# Patient Record
Sex: Male | Born: 1945 | Race: White | Hispanic: No | Marital: Married | Smoking: Former smoker
Health system: Southern US, Community
[De-identification: ages and names within clinical notes are randomized; demographics above are authoritative.]

## PROBLEM LIST (undated history)

## (undated) DIAGNOSIS — R200 Anesthesia of skin: Secondary | ICD-10-CM

## (undated) DIAGNOSIS — I10 Essential (primary) hypertension: Secondary | ICD-10-CM

## (undated) DIAGNOSIS — I639 Cerebral infarction, unspecified: Secondary | ICD-10-CM

## (undated) DIAGNOSIS — R202 Paresthesia of skin: Secondary | ICD-10-CM

## (undated) DIAGNOSIS — G629 Polyneuropathy, unspecified: Secondary | ICD-10-CM

## (undated) DIAGNOSIS — M1990 Unspecified osteoarthritis, unspecified site: Secondary | ICD-10-CM

## (undated) DIAGNOSIS — C801 Malignant (primary) neoplasm, unspecified: Secondary | ICD-10-CM

## (undated) DIAGNOSIS — I480 Paroxysmal atrial fibrillation: Secondary | ICD-10-CM

## (undated) DIAGNOSIS — D649 Anemia, unspecified: Secondary | ICD-10-CM

## (undated) DIAGNOSIS — E119 Type 2 diabetes mellitus without complications: Secondary | ICD-10-CM

## (undated) DIAGNOSIS — E785 Hyperlipidemia, unspecified: Secondary | ICD-10-CM

## (undated) HISTORY — PX: KNEE SURGERY: SHX244

## (undated) HISTORY — DX: Malignant (primary) neoplasm, unspecified: C80.1

## (undated) HISTORY — DX: Cerebral infarction, unspecified: I63.9

## (undated) HISTORY — DX: Anemia, unspecified: D64.9

## (undated) HISTORY — DX: Essential (primary) hypertension: I10

## (undated) HISTORY — DX: Type 2 diabetes mellitus without complications: E11.9

## (undated) HISTORY — DX: Hyperlipidemia, unspecified: E78.5

---

## 1978-10-08 HISTORY — PX: KNEE SURGERY: SHX244

## 2002-09-07 DIAGNOSIS — C4491 Basal cell carcinoma of skin, unspecified: Secondary | ICD-10-CM

## 2002-09-07 HISTORY — DX: Basal cell carcinoma of skin, unspecified: C44.91

## 2004-09-07 DIAGNOSIS — I10 Essential (primary) hypertension: Secondary | ICD-10-CM | POA: Insufficient documentation

## 2004-09-07 DIAGNOSIS — I480 Paroxysmal atrial fibrillation: Secondary | ICD-10-CM | POA: Insufficient documentation

## 2004-09-07 HISTORY — DX: Paroxysmal atrial fibrillation: I48.0

## 2004-09-15 ENCOUNTER — Encounter: Payer: Self-pay | Admitting: Cardiology

## 2008-09-07 DIAGNOSIS — E1142 Type 2 diabetes mellitus with diabetic polyneuropathy: Secondary | ICD-10-CM | POA: Insufficient documentation

## 2008-09-07 DIAGNOSIS — E119 Type 2 diabetes mellitus without complications: Secondary | ICD-10-CM | POA: Insufficient documentation

## 2011-03-08 DIAGNOSIS — Z8673 Personal history of transient ischemic attack (TIA), and cerebral infarction without residual deficits: Secondary | ICD-10-CM | POA: Insufficient documentation

## 2011-03-08 HISTORY — DX: Personal history of transient ischemic attack (TIA), and cerebral infarction without residual deficits: Z86.73

## 2012-03-31 ENCOUNTER — Inpatient Hospital Stay (HOSPITAL_COMMUNITY)
Admission: EM | Admit: 2012-03-31 | Discharge: 2012-04-01 | DRG: 066 | Disposition: A | Discharge status: Home / Self Care | Payer: Medicare Other | Attending: Internal Medicine | Admitting: Internal Medicine

## 2012-03-31 ENCOUNTER — Encounter (HOSPITAL_COMMUNITY): Payer: Self-pay | Admitting: Emergency Medicine

## 2012-03-31 ENCOUNTER — Emergency Department (HOSPITAL_COMMUNITY): Payer: Medicare Other

## 2012-03-31 DIAGNOSIS — Z794 Long term (current) use of insulin: Secondary | ICD-10-CM

## 2012-03-31 DIAGNOSIS — E119 Type 2 diabetes mellitus without complications: Secondary | ICD-10-CM | POA: Diagnosis present

## 2012-03-31 DIAGNOSIS — E1149 Type 2 diabetes mellitus with other diabetic neurological complication: Secondary | ICD-10-CM | POA: Diagnosis present

## 2012-03-31 DIAGNOSIS — Z7982 Long term (current) use of aspirin: Secondary | ICD-10-CM

## 2012-03-31 DIAGNOSIS — I498 Other specified cardiac arrhythmias: Secondary | ICD-10-CM

## 2012-03-31 DIAGNOSIS — I6350 Cerebral infarction due to unspecified occlusion or stenosis of unspecified cerebral artery: Principal | ICD-10-CM | POA: Diagnosis present

## 2012-03-31 DIAGNOSIS — I480 Paroxysmal atrial fibrillation: Secondary | ICD-10-CM | POA: Diagnosis present

## 2012-03-31 DIAGNOSIS — Z79899 Other long term (current) drug therapy: Secondary | ICD-10-CM

## 2012-03-31 DIAGNOSIS — I4891 Unspecified atrial fibrillation: Secondary | ICD-10-CM | POA: Diagnosis present

## 2012-03-31 DIAGNOSIS — G629 Polyneuropathy, unspecified: Secondary | ICD-10-CM | POA: Diagnosis present

## 2012-03-31 DIAGNOSIS — I639 Cerebral infarction, unspecified: Secondary | ICD-10-CM | POA: Diagnosis present

## 2012-03-31 DIAGNOSIS — E1142 Type 2 diabetes mellitus with diabetic polyneuropathy: Secondary | ICD-10-CM | POA: Diagnosis present

## 2012-03-31 HISTORY — DX: Polyneuropathy, unspecified: G62.9

## 2012-03-31 HISTORY — DX: Paroxysmal atrial fibrillation: I48.0

## 2012-03-31 LAB — POCT I-STAT, CHEM 8
Glucose, Bld: 112 mg/dL — ABNORMAL HIGH (ref 70–99)
HCT: 41 % (ref 39.0–52.0)
Hemoglobin: 13.9 g/dL (ref 13.0–17.0)
Potassium: 3.9 mEq/L (ref 3.5–5.1)

## 2012-03-31 LAB — DIFFERENTIAL
Basophils Absolute: 0 10*3/uL (ref 0.0–0.1)
Eosinophils Absolute: 0.1 10*3/uL (ref 0.0–0.7)
Eosinophils Relative: 1 % (ref 0–5)
Monocytes Absolute: 0.7 10*3/uL (ref 0.1–1.0)

## 2012-03-31 LAB — CK TOTAL AND CKMB (NOT AT ARMC)
CK, MB: 12.2 ng/mL (ref 0.3–4.0)
Relative Index: 3.6 — ABNORMAL HIGH (ref 0.0–2.5)
Total CK: 300 U/L — ABNORMAL HIGH (ref 7–232)

## 2012-03-31 LAB — COMPREHENSIVE METABOLIC PANEL
ALT: 22 U/L (ref 0–53)
AST: 25 U/L (ref 0–37)
CO2: 25 mEq/L (ref 19–32)
Calcium: 9.9 mg/dL (ref 8.4–10.5)
Creatinine, Ser: 1.18 mg/dL (ref 0.50–1.35)
GFR calc Af Amer: 71 mL/min — ABNORMAL LOW (ref >90)
GFR calc non Af Amer: 62 mL/min — ABNORMAL LOW (ref >90)
Glucose, Bld: 111 mg/dL — ABNORMAL HIGH (ref 70–99)
Sodium: 141 mEq/L (ref 135–145)
Total Protein: 6.9 g/dL (ref 6.0–8.3)

## 2012-03-31 LAB — PROTIME-INR: Prothrombin Time: 14.1 seconds (ref 11.6–15.2)

## 2012-03-31 LAB — CBC
HCT: 40.1 % (ref 39.0–52.0)
MCH: 30.7 pg (ref 26.0–34.0)
MCV: 88.5 fL (ref 78.0–100.0)
Platelets: 149 10*3/uL — ABNORMAL LOW (ref 150–400)
RDW: 13.2 % (ref 11.5–15.5)

## 2012-03-31 LAB — GLUCOSE, CAPILLARY: Glucose-Capillary: 110 mg/dL — ABNORMAL HIGH (ref 70–99)

## 2012-03-31 LAB — RAPID URINE DRUG SCREEN, HOSP PERFORMED
Barbiturates: NOT DETECTED
Benzodiazepines: NOT DETECTED

## 2012-03-31 LAB — POCT I-STAT TROPONIN I: Troponin i, poc: 0 ng/mL (ref 0.00–0.08)

## 2012-03-31 LAB — TROPONIN I: Troponin I: <0.3 ng/mL (ref <0.30)

## 2012-03-31 MED ORDER — GADOBENATE DIMEGLUMINE 529 MG/ML IV SOLN
15.0000 mL | Freq: Once | INTRAVENOUS | Status: AC
  Administered 2012-03-31: 15 mL via INTRAVENOUS

## 2012-03-31 MED ORDER — SODIUM CHLORIDE 0.9 % IJ SOLN
3.0000 mL | Freq: Two times a day (BID) | INTRAMUSCULAR | Status: DC
  Administered 2012-03-31 – 2012-04-01 (×2): 3 mL via INTRAVENOUS

## 2012-03-31 MED ORDER — WARFARIN VIDEO
Freq: Once | Status: AC
  Administered 2012-03-31: 21:00:00

## 2012-03-31 MED ORDER — ONDANSETRON HCL 4 MG/2ML IJ SOLN: 4.0000 mg | Freq: Four times a day (QID) | INTRAMUSCULAR | Status: DC | PRN

## 2012-03-31 MED ORDER — IBUPROFEN 800 MG PO TABS: 800.0000 mg | ORAL_TABLET | Freq: Four times a day (QID) | ORAL | Status: DC | PRN

## 2012-03-31 MED ORDER — WARFARIN - PHARMACIST DOSING INPATIENT: Freq: Every day | Status: DC

## 2012-03-31 MED ORDER — INSULIN ASPART 100 UNIT/ML ~~LOC~~ SOLN: 0.0000 [IU] | Freq: Three times a day (TID) | SUBCUTANEOUS | Status: DC

## 2012-03-31 MED ORDER — HYDROCODONE-ACETAMINOPHEN 5-325 MG PO TABS: 1.0000 | ORAL_TABLET | ORAL | Status: DC | PRN

## 2012-03-31 MED ORDER — ASPIRIN EC 81 MG PO TBEC: 81.0000 mg | DELAYED_RELEASE_TABLET | Freq: Every day | ORAL | Status: DC

## 2012-03-31 MED ORDER — METOPROLOL TARTRATE 25 MG PO TABS: 50.0000 mg | ORAL_TABLET | Freq: Two times a day (BID) | ORAL | Status: DC

## 2012-03-31 MED ORDER — ALUM & MAG HYDROXIDE-SIMETH 200-200-20 MG/5ML PO SUSP: 30.0000 mL | Freq: Four times a day (QID) | ORAL | Status: DC | PRN

## 2012-03-31 MED ORDER — ASPIRIN 81 MG PO CHEW: 81.0000 mg | CHEWABLE_TABLET | Freq: Every day | ORAL | Status: DC

## 2012-03-31 MED ORDER — ENOXAPARIN SODIUM 150 MG/ML ~~LOC~~ SOLN: 1.0000 mg/kg | Freq: Two times a day (BID) | SUBCUTANEOUS | Status: DC

## 2012-03-31 MED ORDER — COUMADIN BOOK
Freq: Once | Status: AC
  Administered 2012-03-31: 21:00:00
  Filled 2012-03-31: qty 1

## 2012-03-31 MED ORDER — HEPARIN SODIUM (PORCINE) 5000 UNIT/ML IJ SOLN
5000.0000 [IU] | Freq: Three times a day (TID) | INTRAMUSCULAR | Status: DC
  Administered 2012-03-31 – 2012-04-01 (×2): 5000 [IU] via SUBCUTANEOUS
  Filled 2012-03-31 (×5): qty 1

## 2012-03-31 MED ORDER — WARFARIN SODIUM 7.5 MG PO TABS
7.5000 mg | ORAL_TABLET | Freq: Once | ORAL | Status: AC
  Administered 2012-03-31: 7.5 mg via ORAL
  Filled 2012-03-31: qty 1

## 2012-03-31 MED ORDER — METFORMIN HCL 500 MG PO TABS
1000.0000 mg | ORAL_TABLET | Freq: Two times a day (BID) | ORAL | Status: DC
  Administered 2012-03-31: 1000 mg via ORAL
  Filled 2012-03-31: qty 2

## 2012-03-31 MED ORDER — ONDANSETRON HCL 4 MG PO TABS: 4.0000 mg | ORAL_TABLET | Freq: Four times a day (QID) | ORAL | Status: DC | PRN

## 2012-03-31 MED ORDER — ASPIRIN 81 MG PO CHEW
324.0000 mg | CHEWABLE_TABLET | Freq: Once | ORAL | Status: AC
  Administered 2012-03-31: 324 mg via ORAL
  Filled 2012-03-31: qty 4

## 2012-03-31 MED ORDER — METOPROLOL TARTRATE 25 MG/10 ML ORAL SUSPENSION
12.5000 mg | Freq: Two times a day (BID) | ORAL | Status: DC
  Administered 2012-04-01: 12.5 mg via ORAL
  Filled 2012-03-31 (×3): qty 5

## 2012-03-31 MED ORDER — POLYETHYLENE GLYCOL 3350 17 G PO PACK
17.0000 g | PACK | Freq: Every day | ORAL | Status: DC | PRN
  Filled 2012-03-31: qty 1

## 2012-03-31 NOTE — H&P (Signed)
[facility]                                                                                    Patient Demographics  Elman Dettman, is a 66 y.o. male  CSN: [id number]  MRN: [id number]  DOB - September 18, 1945  Admit Date - 03/31/2012  Outpatient Primary MD for the patient is Kaleen Mask, MD   With History of -  Past Medical History  Diagnosis Date  . Diabetes mellitus   . Paroxysmal atrial fibrillation   . Neuropathy       History reviewed. No pertinent past surgical history.  in for   Chief Complaint  Patient presents with  . Extremity Weakness     HPI  Waylin Dorko  is a 66 y.o. male, with history of diabetes mellitus paroxysmal A. Fib, since with complaints for left upper extremity weakness and numbness, patient reports he woke up this a.m. With left upper extremity weakness, he didn't pay much attention to it, where he felt it was hard for him to carry stuff, where at work he felt it he was unable to carry charts, which prompted him to come to ED, currently patient reports much of his symptoms improved and almost resolved, initially patient had CT brain which did not show any acute abnormality, the patient had MRI of the brain which did show tiny acute infarct in the posterior frontal lobe, patient reports he has history of paroxysmal axial fibrillation, diagnosed 4-5 years ago, where he was on metoprolol for rate controlled, and he he was on aspirin. Patient denies any loss of consciousness ,confusion, altered mental status, slurred speech,dizziness , lightheadedness, chest pain, palpitation, patient was given 325 of aspirin in ED, EKG was normal sinus rhythm, bradycardic at 43, patient reports usually his heart rate in the low 50s or in the mid 40s which is his baseline.    Review of Systems    In addition to the HPI above, No Fever-chills, No Headache, No changes with Vision or hearing, No problems swallowing food or Liquids, No Chest pain,  Cough or Shortness of Breath, No Abdominal pain, No Nausea or Vommitting, Bowel movements are regular, No Blood in stool or Urine, No dysuria, No new skin rashes or bruises, No new joints pains-aches,  Complaints of left upper extremity weakness and numbness and poor coordination No recent weight gain or loss, No polyuria, polydypsia or polyphagia, No significant Mental Stressors.  A full 10 point Review of Systems was done, except as stated above, all other Review of Systems were negative.   Social History History  Substance Use Topics  . Smoking status: Never Smoker   . Smokeless tobacco: Not on file  . Alcohol Use: No    Family History History reviewed. No pertinent family history.   Prior to Admission medications   Medication Sig Start Date End Date Taking? Authorizing Provider  aspirin EC 81 MG tablet Take 81 mg by mouth daily.   Yes Historical Provider, MD  ibuprofen (ADVIL,MOTRIN) 200 MG tablet Take 800 mg by mouth every 6 (six) hours as needed. For pain   Yes Historical Provider, MD  metFORMIN (GLUCOPHAGE) 1000 MG tablet  Take 1,000 mg by mouth 2 (two) times daily with a meal.   Yes Historical Provider, MD  metoprolol (LOPRESSOR) 50 MG tablet Take 50 mg by mouth 2 (two) times daily.   Yes Historical Provider, MD    No Known Allergies  Physical Exam  Vitals  Blood pressure 144/88, pulse 42, temperature 98.3 F (36.8 C), temperature source Oral, resp. rate 18, SpO2 100.00%.   1. General well-nourished male lying in bed in NAD,  2. Normal affect and insight, Not Suicidal or Homicidal, Awake Alert, Oriented X 3.  3. Granular have caused intact, left upper extremity motor strength 4-5 out of 5, symmetrical deep tendon reflexes, bilateral lower extremities and right upper extremity 5 out of 5..  4. Ears and Eyes appear Normal, Conjunctivae clear, PERRLA. Moist Oral Mucosa.  5. Supple Neck, No JVD, No cervical lymphadenopathy appriciated, No Carotid Bruits.  6.  Symmetrical Chest wall movement, Good air movement bilaterally, CTAB.  7. RRR, No Gallops, Rubs or Murmurs, No Parasternal Heave.  8. Positive Bowel Sounds, Abdomen Soft, Non tender, No organomegaly appriciated,No rebound -guarding or rigidity.  9.  No Cyanosis, Normal Skin Turgor, No Skin Rash or Bruise.  10. Good muscle tone,  joints appear normal , no effusions, Normal ROM.  11. No Palpable Lymph Nodes in Neck or Axillae    Data Review  CBC  Lab 03/31/12 1214 03/31/12 1135  WBC -- 8.2  HGB 13.9 13.9  HCT 41.0 40.1  PLT -- 149*  MCV -- 88.5  MCH -- 30.7  MCHC -- 34.7  RDW -- 13.2  LYMPHSABS -- 1.9  MONOABS -- 0.7  EOSABS -- 0.1  BASOSABS -- 0.0  BANDABS -- --   ------------------------------------------------------------------------------------------------------------------  Chemistries   Lab 03/31/12 1214 03/31/12 1135  NA 142 141  K 3.9 4.0  CL 107 105  CO2 -- 25  GLUCOSE 112* 111*  BUN 26* 25*  CREATININE 1.20 1.18  CALCIUM -- 9.9  MG -- --  AST -- 25  ALT -- 22  ALKPHOS -- 61  BILITOT -- 0.4   ------------------------------------------------------------------------------------------------------------------ CrCl is unknown because there is no height on file for the current visit. ------------------------------------------------------------------------------------------------------------------ No results found for this basename: TSH,T4TOTAL,FREET3,T3FREE,THYROIDAB in the last 72 hours   Coagulation profile  Lab 03/31/12 1135  INR 1.07  PROTIME --   ------------------------------------------------------------------------------------------------------------------- No results found for this basename: DDIMER:2 in the last 72 hours -------------------------------------------------------------------------------------------------------------------  Cardiac Enzymes  Lab 03/31/12 1409 03/31/12 1136  CKMB 10.8* 12.2*  TROPONINI <0.30 <0.30  MYOGLOBIN  -- --   ------------------------------------------------------------------------------------------------------------------ No components found with this basename: POCBNP:3   ---------------------------------------------------------------------------------------------------------------  Urinalysis No results found for this basename: colorurine, appearanceur, labspec, phurine, glucoseu, hgbur, bilirubinur, ketonesur, proteinur, urobilinogen, nitrite, leukocytesur    ----------------------------------------------------------------------------------------------------------------    Imaging results:   Ct Head Wo Contrast  03/31/2012  *RADIOLOGY REPORT*  Clinical Data: Extremity weakness.  CT HEAD WITHOUT CONTRAST  Technique:  Contiguous axial images were obtained from the base of the skull through the vertex without contrast.  Comparison: None.  Findings: No mass lesion, mass effect, midline shift, hydrocephalus, hemorrhage.  No territorial ischemia or acute infarction.  Bilateral mucous retention cysts or polyps are present within the maxillary sinuses.  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  IMPRESSION: Negative CT head.  Original Report Authenticated By: Andreas Newport, M.D.   Mr [facility] Wo Contrast  03/31/2012  *RADIOLOGY REPORT*  Clinical Data:  Left sided numbness.  Discoordination. Extremity weakness.  Diabetes.  Atrial fibrillation.  MRI BRAIN WITHOUT CONTRAST MRA HEAD WITHOUT CONTRAST MRA NECK WITHOUT AND WITH CONTRAST  Technique: Multiplanar, multiecho pulse sequences of the brain and surrounding structures were obtained according to standard protocol without intravenous contrast.  Angiographic images of the Circle of Willis were obtained using MRA technique without intravenous contrast.  Angiographic images of the neck were obtained using MRA technique without and with intravenous contrast.  Contrast:15 ml MultiHance.  Comparison:03/31/2012 head CT.  No comparison brain  MR.  MRI HEAD  Findings: Tiny acute non hemorrhagic infarct posterior right frontal lobe.  Mild white matter type changes consistent with result of small vessel disease.  No intracranial hemorrhage.  No intracranial mass lesion detected on this unenhanced exam.  Mild to atrophy without hydrocephalus.  Partially empty sella incidentally noted.  Polypoid opacification maxillary sinuses.  Mild mucosal thickening ethmoid sinus air cells and sphenoid sinus air cells.  IMPRESSION: Tiny acute non hemorrhagic infarct posterior right frontal lobe.  Please see above.  MRA HEAD  Findings:Anterior circulation without medium or large size vessel significant stenosis or occlusion.  Fetal type origin right posterior cerebral artery.  Mild middle cerebral artery branch vessel irregularity bilaterally.  No significant stenosis of the distal vertebral arteries or basilar artery.  Nonvisualization right PICA.  Poor delineation majority of the left AICA.  Mild irregularity distal posterior cerebral artery bilaterally.  Minimal bulge of the basilar tip probably represents tortuous vessel without aneurysm or vascular malformation noted.  IMPRESSION: Mild branch vessel irregularity as noted above.  MRA NECK  Findings:Mild irregularity of the proximal internal carotid artery bilaterally without measurable significant stenosis.  Left vertebral artery arises directly from the aortic arch. Proximal aspect slightly ectatic with minimal irregularity but without high-grade stenosis.  Two vessels traverse to supply the mid right vertebral artery.  The proximal aspect of these branches is slightly ectatic with minimal to mild narrowing.  IMPRESSION: Mild irregularity of the proximal internal carotid artery bilaterally without measurable significant stenosis.  Left vertebral artery arises directly from the aortic arch. Proximal aspect slightly ectatic with minimal irregularity but without high-grade stenosis.  Two vessels traverse to supply the mid  right vertebral artery.  The proximal aspect of these branches is slightly ectatic with minimal to mild narrowing.  Critical Value/emergent results were called by telephone at the time of interpretation on 03/31/2012 at 4:55 p.m. to Dr. Manus Gunning, who verbally acknowledged these results.  Original Report Authenticated By: Fuller Canada, M.D.   Mr Angiogram Neck W Wo Contrast  03/31/2012  *RADIOLOGY REPORT*  Clinical Data:  Left sided numbness.  Discoordination. Extremity weakness.  Diabetes.  Atrial fibrillation.  MRI BRAIN WITHOUT CONTRAST MRA HEAD WITHOUT CONTRAST MRA NECK WITHOUT AND WITH CONTRAST  Technique: Multiplanar, multiecho pulse sequences of the brain and surrounding structures were obtained according to standard protocol without intravenous contrast.  Angiographic images of the Circle of Willis were obtained using MRA technique without intravenous contrast.  Angiographic images of the neck were obtained using MRA technique without and with intravenous contrast.  Contrast:15 ml MultiHance.  Comparison:03/31/2012 head CT.  No comparison brain MR.  MRI HEAD  Findings: Tiny acute non hemorrhagic infarct posterior right frontal lobe.  Mild white matter type changes consistent with result of small vessel disease.  No intracranial hemorrhage.  No intracranial mass lesion detected on this unenhanced exam.  Mild to atrophy without hydrocephalus.  Partially empty sella incidentally noted.  Polypoid opacification maxillary sinuses.  Mild mucosal thickening ethmoid sinus air  cells and sphenoid sinus air cells.  IMPRESSION: Tiny acute non hemorrhagic infarct posterior right frontal lobe.  Please see above.  MRA HEAD  Findings:Anterior circulation without medium or large size vessel significant stenosis or occlusion.  Fetal type origin right posterior cerebral artery.  Mild middle cerebral artery branch vessel irregularity bilaterally.  No significant stenosis of the distal vertebral arteries or basilar artery.   Nonvisualization right PICA.  Poor delineation majority of the left AICA.  Mild irregularity distal posterior cerebral artery bilaterally.  Minimal bulge of the basilar tip probably represents tortuous vessel without aneurysm or vascular malformation noted.  IMPRESSION: Mild branch vessel irregularity as noted above.  MRA NECK  Findings:Mild irregularity of the proximal internal carotid artery bilaterally without measurable significant stenosis.  Left vertebral artery arises directly from the aortic arch. Proximal aspect slightly ectatic with minimal irregularity but without high-grade stenosis.  Two vessels traverse to supply the mid right vertebral artery.  The proximal aspect of these branches is slightly ectatic with minimal to mild narrowing.  IMPRESSION: Mild irregularity of the proximal internal carotid artery bilaterally without measurable significant stenosis.  Left vertebral artery arises directly from the aortic arch. Proximal aspect slightly ectatic with minimal irregularity but without high-grade stenosis.  Two vessels traverse to supply the mid right vertebral artery.  The proximal aspect of these branches is slightly ectatic with minimal to mild narrowing.  Critical Value/emergent results were called by telephone at the time of interpretation on 03/31/2012 at 4:55 p.m. to Dr. Manus Gunning, who verbally acknowledged these results.  Original Report Authenticated By: Fuller Canada, M.D.   Mr Brain Wo Contrast  03/31/2012  *RADIOLOGY REPORT*  Clinical Data:  Left sided numbness.  Discoordination. Extremity weakness.  Diabetes.  Atrial fibrillation.  MRI BRAIN WITHOUT CONTRAST MRA HEAD WITHOUT CONTRAST MRA NECK WITHOUT AND WITH CONTRAST  Technique: Multiplanar, multiecho pulse sequences of the brain and surrounding structures were obtained according to standard protocol without intravenous contrast.  Angiographic images of the Circle of Willis were obtained using MRA technique without intravenous contrast.   Angiographic images of the neck were obtained using MRA technique without and with intravenous contrast.  Contrast:15 ml MultiHance.  Comparison:03/31/2012 head CT.  No comparison brain MR.  MRI HEAD  Findings: Tiny acute non hemorrhagic infarct posterior right frontal lobe.  Mild white matter type changes consistent with result of small vessel disease.  No intracranial hemorrhage.  No intracranial mass lesion detected on this unenhanced exam.  Mild to atrophy without hydrocephalus.  Partially empty sella incidentally noted.  Polypoid opacification maxillary sinuses.  Mild mucosal thickening ethmoid sinus air cells and sphenoid sinus air cells.  IMPRESSION: Tiny acute non hemorrhagic infarct posterior right frontal lobe.  Please see above.  MRA HEAD  Findings:Anterior circulation without medium or large size vessel significant stenosis or occlusion.  Fetal type origin right posterior cerebral artery.  Mild middle cerebral artery branch vessel irregularity bilaterally.  No significant stenosis of the distal vertebral arteries or basilar artery.  Nonvisualization right PICA.  Poor delineation majority of the left AICA.  Mild irregularity distal posterior cerebral artery bilaterally.  Minimal bulge of the basilar tip probably represents tortuous vessel without aneurysm or vascular malformation noted.  IMPRESSION: Mild branch vessel irregularity as noted above.  MRA NECK  Findings:Mild irregularity of the proximal internal carotid artery bilaterally without measurable significant stenosis.  Left vertebral artery arises directly from the aortic arch. Proximal aspect slightly ectatic with minimal irregularity but without high-grade stenosis.  Two  vessels traverse to supply the mid right vertebral artery.  The proximal aspect of these branches is slightly ectatic with minimal to mild narrowing.  IMPRESSION: Mild irregularity of the proximal internal carotid artery bilaterally without measurable significant stenosis.  Left  vertebral artery arises directly from the aortic arch. Proximal aspect slightly ectatic with minimal irregularity but without high-grade stenosis.  Two vessels traverse to supply the mid right vertebral artery.  The proximal aspect of these branches is slightly ectatic with minimal to mild narrowing.  Critical Value/emergent results were called by telephone at the time of interpretation on 03/31/2012 at 4:55 p.m. to Dr. Manus Gunning, who verbally acknowledged these results.  Original Report Authenticated By: Fuller Canada, M.D.    My personal review of EKG: Rhythm NSR, 43   Assessment & Plan  Active Problems:  Paroxysmal atrial fibrillation  Neuropathy  CVA (cerebral infarction)  DM (diabetes mellitus)    1. CVA. Patient does have evidence of posterior frontal lobe acute infarct on MRI, given 325 of aspirin in ED,will check lipid panel in a.m., patient MRA head and neck does not show any significant stenosis, given patient's known history of paroxysmal A. Fib will be started on warfarin, will check 2-D echo and will place patient on telemetry monitor, and we'll continue him on 325 of aspirin daily discussed with Dr. Roseanne Reno.  2. Paroxysmal axial fibrillation, currently patient is normal sinus rhythm, will continue him on low-dose metoprolol, will start him on anticoagulation.  3. Diabetes mellitus. Secondary to patient's unpredictable by mouth intake intake as MI go for further testing, will hold his metformin and start him on insulin sliding scale.  4. Neuropathy. Stable.   DVT Prophylaxis heparin. AM Labs Ordered, also please review Full Orders  Family Communication: Admission, patients condition and plan of care including tests being ordered have been discussed with the patient  who indicate understanding and agree with the plan and Code Status.  Code Status full Disposition Plan:  Time spent in minutes :60  Condition GUARDED  Jodeci Rini M.D on 03/31/2012 at 6:43  PM  Between 7am to 7pm - Pager - [phone]  After 7pm go to [email or web address] - password [email or web address]  And look for the night coverage person covering me after hours  [facility] Office  [phone]

## 2012-03-31 NOTE — ED Notes (Signed)
Lunch ordered for pt

## 2012-03-31 NOTE — ED Notes (Signed)
Pt returned from MRI °

## 2012-03-31 NOTE — ED Provider Notes (Signed)
Care assumed of pt in CDU, awaiting MR studies. Pt woke up this AM around 3 AM with difficulty using his L (nondominant) hand. 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 His sx have gradually been improving during his ED stay.  Pt's MRI significant for tiny new nonhemorrhagic R frontal infarct. He will be admitted for further evaluation. Talked with Dr. Randol Kern of [facility] who accepts pt for admission. He requests neuro consult; discussed with Dr. Roseanne Reno who will see.  Grant Fontana, PA-C 03/31/12 1802

## 2012-03-31 NOTE — Progress Notes (Signed)
ANTICOAGULATION CONSULT NOTE - Initial Consult  Pharmacy Consult for Coumadin Indication: atrial fibrillation and acute stroke  No Known Allergies  Vital Signs: Temp: 98.3 F (36.8 C) (07/25 1135) Temp src: Oral (07/25 1135) BP: 144/88 mmHg (07/25 1800) Pulse Rate: 42  (07/25 1334)  Labs:  Basename 03/31/12 1409 03/31/12 1214 03/31/12 1136 03/31/12 1135  HGB -- 13.9 -- 13.9  HCT -- 41.0 -- 40.1  PLT -- -- -- 149*  APTT -- -- -- 32  LABPROT -- -- -- 14.1  INR -- -- -- 1.07  HEPARINUNFRC -- -- -- --  CREATININE -- 1.20 -- 1.18  CKTOTAL 300* -- 336* --  CKMB 10.8* -- 12.2* --  TROPONINI <0.30 -- <0.30 --    CrCl is unknown because there is no height on file for the current visit.   Medical History: Past Medical History  Diagnosis Date  . Diabetes mellitus   . Paroxysmal atrial fibrillation   . Neuropathy     Medications:   (Not in a hospital admission)  Assessment: 66 y/o male patient admitted with weakness, found to have acute nonhemorrhagic stroke. Most likely cardioembolic d/t h/o afib. Plan to begin coumadin for secondary prophylaxis.  Goal of Therapy:  INR 2-3 Monitor platelets by anticoagulation protocol: Yes   Plan:  Coumadin 7.5mg  po today, daily protime and provide educational materials.  Verlene Mayer, PharmD, BCPS Pager [phone] 03/31/2012,6:51 PM

## 2012-03-31 NOTE — ED Notes (Signed)
Pt sts woke up this am with his left arm having trouble with coordination and grasping objects; no obvert weakness noted; pt LSN was last night; pt with no other neuro deficitis

## 2012-03-31 NOTE — ED Notes (Signed)
Awaiting pt to finish eating to take him to his room

## 2012-03-31 NOTE — Consult Note (Signed)
Referring Physician: Dr.Elgergawy    Chief Complaint: Weakness of left hand.  HPI: Darryl Navarro is an 66 y.o. male history of diabetes mellitus and atrial fibrillation, not on anticoagulation, who woke up with weakness involving his left hand today. He had no symptoms at bedtime last night. He had no associated speech problems nor facial weakness. He's also had no swallowing difficulty. He said no symptoms involving his left lower extremity. His been on aspirin 81 mg per day. CT scan of his head was unremarkable. MRI showed a small right posterior frontal punctate type acute ischemic infarction. MRA of the head as well as MRA of the neck showed no significant stenosis or occlusive cerebral vascular disease. His NIH stroke score at this point is 0. Strength has improved and his left hand since this morning.  LSN: Bedtime on 03/30/2012 tPA Given: No: Minimal deficits and beyond time window for TPA treatment  MRankin: 0  Past Medical History  Diagnosis Date  . Diabetes mellitus   . Paroxysmal atrial fibrillation   . Neuropathy     History reviewed. No pertinent family history.   Medications:  Prior to Admission:  Aspirin 81 mg per day Ibuprofen 800 mg every 6 hours when necessary pain Metformin 1000 mg twice a day Lopressor 50 mg twice a day  Physical Examination: Blood pressure 144/88, pulse 42, temperature 98.3 F (36.8 C), temperature source Oral, resp. rate 18, SpO2 100.00%.  Neurologic Examination: Mental Status: Alert, oriented, thought content appropriate.  Speech fluent without evidence of aphasia. Able to follow commands without difficulty. Cranial Nerves: II-Visual fields were normal. III/IV/VI-Pupils were equal and reacted. Extraocular movements were full and conjugate.    V/VII-no facial numbness and no facial weakness. VIII-normal. X-normal speech and symmetrical palatal movement. Motor: 5/5 bilaterally with normal tone and bulk except for slight weakness of  intrinsic hand muscles on the left. Sensory: Reduced tactile sensation distally in the right lower extremity, as well as reduced vibratory sensation below the knee bilaterally. Deep Tendon Reflexes: Trace to 1+ and symmetric. Plantars: Flexor bilaterally Cerebellar: Normal finger-to-nose testing. Carotid auscultation: Normal   Ct Head Wo Contrast  03/31/2012  *RADIOLOGY REPORT*  Clinical Data: Extremity weakness.  CT HEAD WITHOUT CONTRAST  Technique:  Contiguous axial images were obtained from the base of the skull through the vertex without contrast.  Comparison: None.  Findings: No mass lesion, mass effect, midline shift, hydrocephalus, hemorrhage.  No territorial ischemia or acute infarction.  Bilateral mucous retention cysts or polyps are present within the maxillary sinuses.  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  IMPRESSION: Negative CT head.  Original Report Authenticated By: Andreas Newport, M.D.   Mr [facility] Wo Contrast  03/31/2012  *RADIOLOGY REPORT*  Clinical Data:  Left sided numbness.  Discoordination. Extremity weakness.  Diabetes.  Atrial fibrillation.  MRI BRAIN WITHOUT CONTRAST MRA HEAD WITHOUT CONTRAST MRA NECK WITHOUT AND WITH CONTRAST  Technique: Multiplanar, multiecho pulse sequences of the brain and surrounding structures were obtained according to standard protocol without intravenous contrast.  Angiographic images of the Circle of Willis were obtained using MRA technique without intravenous contrast.  Angiographic images of the neck were obtained using MRA technique without and with intravenous contrast.  Contrast:15 ml MultiHance.  Comparison:03/31/2012 head CT.  No comparison brain MR.  MRI HEAD  Findings: Tiny acute non hemorrhagic infarct posterior right frontal lobe.  Mild white matter type changes consistent with result of small vessel disease.  No intracranial hemorrhage.  No intracranial mass  lesion detected on this unenhanced exam.  Mild to atrophy without  hydrocephalus.  Partially empty sella incidentally noted.  Polypoid opacification maxillary sinuses.  Mild mucosal thickening ethmoid sinus air cells and sphenoid sinus air cells.  IMPRESSION: Tiny acute non hemorrhagic infarct posterior right frontal lobe.  Please see above.  MRA HEAD  Findings:Anterior circulation without medium or large size vessel significant stenosis or occlusion.  Fetal type origin right posterior cerebral artery.  Mild middle cerebral artery branch vessel irregularity bilaterally.  No significant stenosis of the distal vertebral arteries or basilar artery.  Nonvisualization right PICA.  Poor delineation majority of the left AICA.  Mild irregularity distal posterior cerebral artery bilaterally.  Minimal bulge of the basilar tip probably represents tortuous vessel without aneurysm or vascular malformation noted.  IMPRESSION: Mild branch vessel irregularity as noted above.  MRA NECK  Findings:Mild irregularity of the proximal internal carotid artery bilaterally without measurable significant stenosis.  Left vertebral artery arises directly from the aortic arch. Proximal aspect slightly ectatic with minimal irregularity but without high-grade stenosis.  Two vessels traverse to supply the mid right vertebral artery.  The proximal aspect of these branches is slightly ectatic with minimal to mild narrowing.  IMPRESSION: Mild irregularity of the proximal internal carotid artery bilaterally without measurable significant stenosis.  Left vertebral artery arises directly from the aortic arch. Proximal aspect slightly ectatic with minimal irregularity but without high-grade stenosis.  Two vessels traverse to supply the mid right vertebral artery.  The proximal aspect of these branches is slightly ectatic with minimal to mild narrowing.  Critical Value/emergent results were called by telephone at the time of interpretation on 03/31/2012 at 4:55 p.m. to Dr. Manus Gunning, who verbally acknowledged these results.   Original Report Authenticated By: Fuller Canada, M.D.   Mr Angiogram Neck W Wo Contrast  03/31/2012  *RADIOLOGY REPORT*  Clinical Data:  Left sided numbness.  Discoordination. Extremity weakness.  Diabetes.  Atrial fibrillation.  MRI BRAIN WITHOUT CONTRAST MRA HEAD WITHOUT CONTRAST MRA NECK WITHOUT AND WITH CONTRAST  Technique: Multiplanar, multiecho pulse sequences of the brain and surrounding structures were obtained according to standard protocol without intravenous contrast.  Angiographic images of the Circle of Willis were obtained using MRA technique without intravenous contrast.  Angiographic images of the neck were obtained using MRA technique without and with intravenous contrast.  Contrast:15 ml MultiHance.  Comparison:03/31/2012 head CT.  No comparison brain MR.  MRI HEAD  Findings: Tiny acute non hemorrhagic infarct posterior right frontal lobe.  Mild white matter type changes consistent with result of small vessel disease.  No intracranial hemorrhage.  No intracranial mass lesion detected on this unenhanced exam.  Mild to atrophy without hydrocephalus.  Partially empty sella incidentally noted.  Polypoid opacification maxillary sinuses.  Mild mucosal thickening ethmoid sinus air cells and sphenoid sinus air cells.  IMPRESSION: Tiny acute non hemorrhagic infarct posterior right frontal lobe.  Please see above.  MRA HEAD  Findings:Anterior circulation without medium or large size vessel significant stenosis or occlusion.  Fetal type origin right posterior cerebral artery.  Mild middle cerebral artery branch vessel irregularity bilaterally.  No significant stenosis of the distal vertebral arteries or basilar artery.  Nonvisualization right PICA.  Poor delineation majority of the left AICA.  Mild irregularity distal posterior cerebral artery bilaterally.  Minimal bulge of the basilar tip probably represents tortuous vessel without aneurysm or vascular malformation noted.  IMPRESSION: Mild branch  vessel irregularity as noted above.  MRA NECK  Findings:Mild irregularity of  the proximal internal carotid artery bilaterally without measurable significant stenosis.  Left vertebral artery arises directly from the aortic arch. Proximal aspect slightly ectatic with minimal irregularity but without high-grade stenosis.  Two vessels traverse to supply the mid right vertebral artery.  The proximal aspect of these branches is slightly ectatic with minimal to mild narrowing.  IMPRESSION: Mild irregularity of the proximal internal carotid artery bilaterally without measurable significant stenosis.  Left vertebral artery arises directly from the aortic arch. Proximal aspect slightly ectatic with minimal irregularity but without high-grade stenosis.  Two vessels traverse to supply the mid right vertebral artery.  The proximal aspect of these branches is slightly ectatic with minimal to mild narrowing.  Critical Value/emergent results were called by telephone at the time of interpretation on 03/31/2012 at 4:55 p.m. to Dr. Manus Gunning, who verbally acknowledged these results.  Original Report Authenticated By: Fuller Canada, M.D.   Mr Brain Wo Contrast  03/31/2012  *RADIOLOGY REPORT*  Clinical Data:  Left sided numbness.  Discoordination. Extremity weakness.  Diabetes.  Atrial fibrillation.  MRI BRAIN WITHOUT CONTRAST MRA HEAD WITHOUT CONTRAST MRA NECK WITHOUT AND WITH CONTRAST  Technique: Multiplanar, multiecho pulse sequences of the brain and surrounding structures were obtained according to standard protocol without intravenous contrast.  Angiographic images of the Circle of Willis were obtained using MRA technique without intravenous contrast.  Angiographic images of the neck were obtained using MRA technique without and with intravenous contrast.  Contrast:15 ml MultiHance.  Comparison:03/31/2012 head CT.  No comparison brain MR.  MRI HEAD  Findings: Tiny acute non hemorrhagic infarct posterior right frontal lobe.  Mild  white matter type changes consistent with result of small vessel disease.  No intracranial hemorrhage.  No intracranial mass lesion detected on this unenhanced exam.  Mild to atrophy without hydrocephalus.  Partially empty sella incidentally noted.  Polypoid opacification maxillary sinuses.  Mild mucosal thickening ethmoid sinus air cells and sphenoid sinus air cells.  IMPRESSION: Tiny acute non hemorrhagic infarct posterior right frontal lobe.  Please see above.  MRA HEAD  Findings:Anterior circulation without medium or large size vessel significant stenosis or occlusion.  Fetal type origin right posterior cerebral artery.  Mild middle cerebral artery branch vessel irregularity bilaterally.  No significant stenosis of the distal vertebral arteries or basilar artery.  Nonvisualization right PICA.  Poor delineation majority of the left AICA.  Mild irregularity distal posterior cerebral artery bilaterally.  Minimal bulge of the basilar tip probably represents tortuous vessel without aneurysm or vascular malformation noted.  IMPRESSION: Mild branch vessel irregularity as noted above.  MRA NECK  Findings:Mild irregularity of the proximal internal carotid artery bilaterally without measurable significant stenosis.  Left vertebral artery arises directly from the aortic arch. Proximal aspect slightly ectatic with minimal irregularity but without high-grade stenosis.  Two vessels traverse to supply the mid right vertebral artery.  The proximal aspect of these branches is slightly ectatic with minimal to mild narrowing.  IMPRESSION: Mild irregularity of the proximal internal carotid artery bilaterally without measurable significant stenosis.  Left vertebral artery arises directly from the aortic arch. Proximal aspect slightly ectatic with minimal irregularity but without high-grade stenosis.  Two vessels traverse to supply the mid right vertebral artery.  The proximal aspect of these branches is slightly ectatic with minimal  to mild narrowing.  Critical Value/emergent results were called by telephone at the time of interpretation on 03/31/2012 at 4:55 p.m. to Dr. Manus Gunning, who verbally acknowledged these results.  Original Report Authenticated By: Almedia Balls  Constance Goltz, M.D.    Assessment: 66 y.o. 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  Stroke Risk Factors - atrial fibrillation and diabetes mellitus  Plan: 1. HgbA1c, fasting lipid panel 2. PT consult, OT consult 3. Echocardiogram 4. Prophylactic therapy-Anticoagulation: Coumadin- pharmacy to dose 5. Risk factor modification 6. Telemetry monitoring  C.R. Roseanne Reno, MD [facility] [phone]  03/31/2012, 6:49 PM

## 2012-03-31 NOTE — ED Provider Notes (Signed)
Medical screening examination/treatment/procedure(s) were conducted as a shared visit with non-physician practitioner(s) and myself.  I personally evaluated the patient during the encounter   Glynn Octave, MD 03/31/12 [phone]

## 2012-03-31 NOTE — ED Provider Notes (Signed)
History     CSN: [id number]  Arrival date & time 03/31/12  1050   First MD Initiated Contact with Patient 03/31/12 1116      Chief Complaint  Patient presents with  . Extremity Weakness    (Consider location/radiation/quality/duration/timing/severity/associated sxs/prior treatment) HPI Comments: Patient awoke around 3 AM with trouble using his left arm. He thought he slept on it funny but the symptoms persisted until this morning. He denies that is getting worse. His difficulty grasping objects and continues to concentrate more to get things down the left arm. He denies any other neurological symptoms. No difficulty breathing or swallowing. No chest pain. No change in speech. No facial droop or difficulty walking. His last seen normal time was last night before bed.  The history is provided by the patient.    Past Medical History  Diagnosis Date  . Diabetes mellitus   . Paroxysmal atrial fibrillation   . Neuropathy     History reviewed. No pertinent past surgical history.  History reviewed. No pertinent family history.  History  Substance Use Topics  . Smoking status: Never Smoker   . Smokeless tobacco: Not on file  . Alcohol Use: No      Review of Systems  Constitutional: Positive for activity change. Negative for fever and appetite change.  HENT: Negative for congestion and rhinorrhea.   Respiratory: Negative for cough, chest tightness and shortness of breath.   Cardiovascular: Negative for chest pain.  Gastrointestinal: Negative for nausea, vomiting and abdominal pain.  Genitourinary: Negative for dysuria.  Musculoskeletal: Positive for extremity weakness. Negative for back pain and gait problem.  Skin: Negative for rash.  Neurological: Positive for weakness and numbness. Negative for dizziness, facial asymmetry and light-headedness.    Allergies  Review of patient's allergies indicates no known allergies.  Home Medications   Current Outpatient Rx  Name  Route Sig Dispense Refill  . ASPIRIN EC 81 MG PO TBEC Oral Take 81 mg by mouth daily.    . IBUPROFEN 200 MG PO TABS Oral Take 800 mg by mouth every 6 (six) hours as needed. For pain    . METFORMIN HCL 1000 MG PO TABS Oral Take 1,000 mg by mouth 2 (two) times daily with a meal.    . METOPROLOL TARTRATE 50 MG PO TABS Oral Take 50 mg by mouth 2 (two) times daily.      BP 113/71  Pulse 42  Temp 98.3 F (36.8 C) (Oral)  Resp 19  SpO2 100%  Physical Exam  Constitutional: He is oriented to person, place, and time. He appears well-developed and well-nourished. No distress.  HENT:  Head: Normocephalic and atraumatic.  Mouth/Throat: Oropharynx is clear and moist. No oropharyngeal exudate.  Eyes: Conjunctivae and EOM are normal. Pupils are equal, round, and reactive to light.  Neck: Normal range of motion. Neck supple.  Cardiovascular: Normal rate, regular rhythm and normal heart sounds.   No murmur heard. Pulmonary/Chest: Effort normal and breath sounds normal. No respiratory distress.  Abdominal: Soft. There is no tenderness. There is no rebound and no guarding.  Musculoskeletal: Normal range of motion. He exhibits no edema and no tenderness.  Neurological: He is alert and oriented to person, place, and time. No cranial nerve deficit.       Cranial nerves III through XII intact. Visual fields full to confrontation. No ataxia finger to nose, no nystagmus. Weak grip strength on the left 4-5 compared to right. Week push full of biceps and triceps. Normal gait, negative  Romberg Equal strength in lower extremities.  Skin: Skin is warm.    ED Course  Procedures (including critical care time)  Labs Reviewed  CBC - Abnormal; Notable for the following:    Platelets 149 (*)     All other components within normal limits  COMPREHENSIVE METABOLIC PANEL - Abnormal; Notable for the following:    Glucose, Bld 111 (*)     BUN 25 (*)     GFR calc non Af Amer 62 (*)     GFR calc Af Amer 71 (*)      All other components within normal limits  CK TOTAL AND CKMB - Abnormal; Notable for the following:    Total CK 336 (*)     CK, MB 12.2 (*)     Relative Index 3.6 (*)     All other components within normal limits  GLUCOSE, CAPILLARY - Abnormal; Notable for the following:    Glucose-Capillary 110 (*)     All other components within normal limits  POCT I-STAT, CHEM 8 - Abnormal; Notable for the following:    BUN 26 (*)     Glucose, Bld 112 (*)     Calcium, Ion 1.32 (*)     All other components within normal limits  CK TOTAL AND CKMB - Abnormal; Notable for the following:    Total CK 300 (*)     CK, MB 10.8 (*)  CRITICAL VALUE NOTED.  VALUE IS CONSISTENT WITH PREVIOUSLY REPORTED AND CALLED VALUE.   Relative Index 3.6 (*)     All other components within normal limits  PROTIME-INR  APTT  DIFFERENTIAL  TROPONIN I  URINE RAPID DRUG SCREEN (HOSP PERFORMED)  POCT I-STAT TROPONIN I  TROPONIN I   Ct Head Wo Contrast  03/31/2012  *RADIOLOGY REPORT*  Clinical Data: Extremity weakness.  CT HEAD WITHOUT CONTRAST  Technique:  Contiguous axial images were obtained from the base of the skull through the vertex without contrast.  Comparison: None.  Findings: No mass lesion, mass effect, midline shift, hydrocephalus, hemorrhage.  No territorial ischemia or acute infarction.  Bilateral mucous retention cysts or polyps are present within the maxillary sinuses.  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  IMPRESSION: Negative CT head.  Original Report Authenticated By: Andreas Newport, M.D.     No diagnosis found.    MDM  Difficulty with coordination and strength in left arm since last night. No other neurological deficits. Patient not TPA candidate given time course.  CT negative. Obtain MRI to rule out CVA.  Care transferred to [facility] in CDU.     Date: 03/31/2012  Rate: 43  Rhythm: sinus bradycardia  QRS Axis: normal  Intervals: normal  ST/T Wave abnormalities: normal   Conduction Disutrbances:none  Narrative Interpretation:   Old EKG Reviewed: none available    Glynn Octave, MD 03/31/12 1711

## 2012-04-01 DIAGNOSIS — I517 Cardiomegaly: Secondary | ICD-10-CM

## 2012-04-01 DIAGNOSIS — G589 Mononeuropathy, unspecified: Secondary | ICD-10-CM

## 2012-04-01 DIAGNOSIS — E119 Type 2 diabetes mellitus without complications: Secondary | ICD-10-CM

## 2012-04-01 DIAGNOSIS — I6350 Cerebral infarction due to unspecified occlusion or stenosis of unspecified cerebral artery: Principal | ICD-10-CM

## 2012-04-01 DIAGNOSIS — I4891 Unspecified atrial fibrillation: Secondary | ICD-10-CM

## 2012-04-01 LAB — BASIC METABOLIC PANEL
CO2: 25 mEq/L (ref 19–32)
Chloride: 106 mEq/L (ref 96–112)
Glucose, Bld: 114 mg/dL — ABNORMAL HIGH (ref 70–99)
Potassium: 4.2 mEq/L (ref 3.5–5.1)
Sodium: 141 mEq/L (ref 135–145)

## 2012-04-01 LAB — LIPID PANEL
HDL: 34 mg/dL — ABNORMAL LOW (ref >39)
Total CHOL/HDL Ratio: 4.2 RATIO
VLDL: 29 mg/dL (ref 0–40)

## 2012-04-01 LAB — CBC
HCT: 38.2 % — ABNORMAL LOW (ref 39.0–52.0)
Hemoglobin: 12.9 g/dL — ABNORMAL LOW (ref 13.0–17.0)
MCH: 30 pg (ref 26.0–34.0)
MCV: 88.8 fL (ref 78.0–100.0)
RBC: 4.3 MIL/uL (ref 4.22–5.81)
WBC: 6.5 10*3/uL (ref 4.0–10.5)

## 2012-04-01 LAB — GLUCOSE, CAPILLARY: Glucose-Capillary: 112 mg/dL — ABNORMAL HIGH (ref 70–99)

## 2012-04-01 MED ORDER — METOPROLOL SUCCINATE 12.5 MG HALF TABLET: 12.5000 mg | ORAL_TABLET | Freq: Every day | ORAL | Status: DC

## 2012-04-01 MED ORDER — DABIGATRAN ETEXILATE MESYLATE 150 MG PO CAPS
150.0000 mg | ORAL_CAPSULE | Freq: Two times a day (BID) | ORAL | Status: DC
  Administered 2012-04-01: 150 mg via ORAL
  Filled 2012-04-01 (×2): qty 1

## 2012-04-01 MED ORDER — DABIGATRAN ETEXILATE MESYLATE 150 MG PO CAPS: 150.0000 mg | ORAL_CAPSULE | Freq: Two times a day (BID) | ORAL | Status: DC

## 2012-04-01 NOTE — Discharge Summary (Signed)
Darryl Navarro MRN: [id number] DOB/AGE: 68-Nov-1947 68 y.o.  Admit date: 03/31/2012 Discharge date: 04/01/2012  Primary Care Physician:  Kaleen Mask, MD   Discharge Diagnoses:   Patient Active Problem List  Diagnosis  . Paroxysmal atrial fibrillation  . Neuropathy  . CVA (cerebral infarction)  . DM (diabetes mellitus)    DISCHARGE MEDICATION: Medication List  As of 04/01/2012  1:06 PM   STOP taking these medications         aspirin EC 81 MG tablet      metoprolol 50 MG tablet         TAKE these medications         dabigatran 150 MG Caps   Commonly known as: [name]   Take 1 capsule (150 mg total) by mouth every 12 (twelve) hours.      ibuprofen 200 MG tablet   Commonly known as: ADVIL,MOTRIN   Take 800 mg by mouth every 6 (six) hours as needed. For pain      metFORMIN 1000 MG tablet   Commonly known as: GLUCOPHAGE   Take 1,000 mg by mouth 2 (two) times daily with a meal.      metoprolol succinate 12.5 mg Tb24   Commonly known as: TOPROL-XL   Take 0.5 tablets (12.5 mg total) by mouth daily.              Consults:     SIGNIFICANT DIAGNOSTIC STUDIES:  Ct Head Wo Contrast  03/31/2012  *RADIOLOGY REPORT*  Clinical Data: Extremity weakness.  CT HEAD WITHOUT CONTRAST  Technique:  Contiguous axial images were obtained from the base of the skull through the vertex without contrast.  Comparison: None.  Findings: No mass lesion, mass effect, midline shift, hydrocephalus, hemorrhage.  No territorial ischemia or acute infarction.  Bilateral mucous retention cysts or polyps are present within the maxillary sinuses.  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  IMPRESSION: Negative CT head.  Original Report Authenticated By: Andreas Newport, M.D.   Mr [facility] Wo Contrast  03/31/2012  *RADIOLOGY REPORT*  Clinical Data:  Left sided numbness.  Discoordination. Extremity weakness.  Diabetes.  Atrial fibrillation.  MRI BRAIN WITHOUT CONTRAST MRA HEAD WITHOUT  CONTRAST MRA NECK WITHOUT AND WITH CONTRAST  Technique: Multiplanar, multiecho pulse sequences of the brain and surrounding structures were obtained according to standard protocol without intravenous contrast.  Angiographic images of the Circle of Willis were obtained using MRA technique without intravenous contrast.  Angiographic images of the neck were obtained using MRA technique without and with intravenous contrast.  Contrast:15 ml MultiHance.  Comparison:03/31/2012 head CT.  No comparison brain MR.  MRI HEAD  Findings: Tiny acute non hemorrhagic infarct posterior right frontal lobe.  Mild white matter type changes consistent with result of small vessel disease.  No intracranial hemorrhage.  No intracranial mass lesion detected on this unenhanced exam.  Mild to atrophy without hydrocephalus.  Partially empty sella incidentally noted.  Polypoid opacification maxillary sinuses.  Mild mucosal thickening ethmoid sinus air cells and sphenoid sinus air cells.  IMPRESSION: Tiny acute non hemorrhagic infarct posterior right frontal lobe.  Please see above.  MRA HEAD  Findings:Anterior circulation without medium or large size vessel significant stenosis or occlusion.  Fetal type origin right posterior cerebral artery.  Mild middle cerebral artery branch vessel irregularity bilaterally.  No significant stenosis of the distal vertebral arteries or basilar artery.  Nonvisualization right PICA.  Poor delineation majority of the left AICA.  Mild irregularity distal posterior cerebral artery  bilaterally.  Minimal bulge of the basilar tip probably represents tortuous vessel without aneurysm or vascular malformation noted.  IMPRESSION: Mild branch vessel irregularity as noted above.  MRA NECK  Findings:Mild irregularity of the proximal internal carotid artery bilaterally without measurable significant stenosis.  Left vertebral artery arises directly from the aortic arch. Proximal aspect slightly ectatic with minimal  irregularity but without high-grade stenosis.  Two vessels traverse to supply the mid right vertebral artery.  The proximal aspect of these branches is slightly ectatic with minimal to mild narrowing.  IMPRESSION: Mild irregularity of the proximal internal carotid artery bilaterally without measurable significant stenosis.  Left vertebral artery arises directly from the aortic arch. Proximal aspect slightly ectatic with minimal irregularity but without high-grade stenosis.  Two vessels traverse to supply the mid right vertebral artery.  The proximal aspect of these branches is slightly ectatic with minimal to mild narrowing.  Critical Value/emergent results were called by telephone at the time of interpretation on 03/31/2012 at 4:55 p.m. to Dr. Manus Gunning, who verbally acknowledged these results.  Original Report Authenticated By: Fuller Canada, M.D.   Mr Angiogram Neck W Wo Contrast  03/31/2012  *RADIOLOGY REPORT*  Clinical Data:  Left sided numbness.  Discoordination. Extremity weakness.  Diabetes.  Atrial fibrillation.  MRI BRAIN WITHOUT CONTRAST MRA HEAD WITHOUT CONTRAST MRA NECK WITHOUT AND WITH CONTRAST  Technique: Multiplanar, multiecho pulse sequences of the brain and surrounding structures were obtained according to standard protocol without intravenous contrast.  Angiographic images of the Circle of Willis were obtained using MRA technique without intravenous contrast.  Angiographic images of the neck were obtained using MRA technique without and with intravenous contrast.  Contrast:15 ml MultiHance.  Comparison:03/31/2012 head CT.  No comparison brain MR.  MRI HEAD  Findings: Tiny acute non hemorrhagic infarct posterior right frontal lobe.  Mild white matter type changes consistent with result of small vessel disease.  No intracranial hemorrhage.  No intracranial mass lesion detected on this unenhanced exam.  Mild to atrophy without hydrocephalus.  Partially empty sella incidentally noted.  Polypoid  opacification maxillary sinuses.  Mild mucosal thickening ethmoid sinus air cells and sphenoid sinus air cells.  IMPRESSION: Tiny acute non hemorrhagic infarct posterior right frontal lobe.  Please see above.  MRA HEAD  Findings:Anterior circulation without medium or large size vessel significant stenosis or occlusion.  Fetal type origin right posterior cerebral artery.  Mild middle cerebral artery branch vessel irregularity bilaterally.  No significant stenosis of the distal vertebral arteries or basilar artery.  Nonvisualization right PICA.  Poor delineation majority of the left AICA.  Mild irregularity distal posterior cerebral artery bilaterally.  Minimal bulge of the basilar tip probably represents tortuous vessel without aneurysm or vascular malformation noted.  IMPRESSION: Mild branch vessel irregularity as noted above.  MRA NECK  Findings:Mild irregularity of the proximal internal carotid artery bilaterally without measurable significant stenosis.  Left vertebral artery arises directly from the aortic arch. Proximal aspect slightly ectatic with minimal irregularity but without high-grade stenosis.  Two vessels traverse to supply the mid right vertebral artery.  The proximal aspect of these branches is slightly ectatic with minimal to mild narrowing.  IMPRESSION: Mild irregularity of the proximal internal carotid artery bilaterally without measurable significant stenosis.  Left vertebral artery arises directly from the aortic arch. Proximal aspect slightly ectatic with minimal irregularity but without high-grade stenosis.  Two vessels traverse to supply the mid right vertebral artery.  The proximal aspect of these branches is slightly ectatic with minimal  to mild narrowing.  Critical Value/emergent results were called by telephone at the time of interpretation on 03/31/2012 at 4:55 p.m. to Dr. Manus Gunning, who verbally acknowledged these results.  Original Report Authenticated By: Fuller Canada, M.D.   Mr  Brain Wo Contrast  03/31/2012  *RADIOLOGY REPORT*  Clinical Data:  Left sided numbness.  Discoordination. Extremity weakness.  Diabetes.  Atrial fibrillation.  MRI BRAIN WITHOUT CONTRAST MRA HEAD WITHOUT CONTRAST MRA NECK WITHOUT AND WITH CONTRAST  Technique: Multiplanar, multiecho pulse sequences of the brain and surrounding structures were obtained according to standard protocol without intravenous contrast.  Angiographic images of the Circle of Willis were obtained using MRA technique without intravenous contrast.  Angiographic images of the neck were obtained using MRA technique without and with intravenous contrast.  Contrast:15 ml MultiHance.  Comparison:03/31/2012 head CT.  No comparison brain MR.  MRI HEAD  Findings: Tiny acute non hemorrhagic infarct posterior right frontal lobe.  Mild white matter type changes consistent with result of small vessel disease.  No intracranial hemorrhage.  No intracranial mass lesion detected on this unenhanced exam.  Mild to atrophy without hydrocephalus.  Partially empty sella incidentally noted.  Polypoid opacification maxillary sinuses.  Mild mucosal thickening ethmoid sinus air cells and sphenoid sinus air cells.  IMPRESSION: Tiny acute non hemorrhagic infarct posterior right frontal lobe.  Please see above.  MRA HEAD  Findings:Anterior circulation without medium or large size vessel significant stenosis or occlusion.  Fetal type origin right posterior cerebral artery.  Mild middle cerebral artery branch vessel irregularity bilaterally.  No significant stenosis of the distal vertebral arteries or basilar artery.  Nonvisualization right PICA.  Poor delineation majority of the left AICA.  Mild irregularity distal posterior cerebral artery bilaterally.  Minimal bulge of the basilar tip probably represents tortuous vessel without aneurysm or vascular malformation noted.  IMPRESSION: Mild branch vessel irregularity as noted above.  MRA NECK  Findings:Mild irregularity of the  proximal internal carotid artery bilaterally without measurable significant stenosis.  Left vertebral artery arises directly from the aortic arch. Proximal aspect slightly ectatic with minimal irregularity but without high-grade stenosis.  Two vessels traverse to supply the mid right vertebral artery.  The proximal aspect of these branches is slightly ectatic with minimal to mild narrowing.  IMPRESSION: Mild irregularity of the proximal internal carotid artery bilaterally without measurable significant stenosis.  Left vertebral artery arises directly from the aortic arch. Proximal aspect slightly ectatic with minimal irregularity but without high-grade stenosis.  Two vessels traverse to supply the mid right vertebral artery.  The proximal aspect of these branches is slightly ectatic with minimal to mild narrowing.  Critical Value/emergent results were called by telephone at the time of interpretation on 03/31/2012 at 4:55 p.m. to Dr. Manus Gunning, who verbally acknowledged these results.  Original Report Authenticated By: Fuller Canada, M.D.     ECHO: - Left ventricle: The cavity size was normal. Wall thickness was increased in a pattern of moderate LVH. Systolic function was mildly to moderately reduced. The estimated ejection fraction was in the range of 40% to 45%. Diffuse hypokinesis. - Left atrium: The atrium was mildly dilated. - Right ventricle: The cavity size was mildly dilated. - Right atrium: The atrium was mildly dilated.      No results found for this or any previous visit (from the past 240 hour(s)).  BRIEF ADMITTING H & P: Darryl Navarro is a 66 y.o. male, with history of diabetes mellitus paroxysmal A. Fib, since with complaints for left  upper extremity weakness and numbness, patient reports he woke up yesterday a.m. With left upper extremity weakness, he didn't pay much attention to it. He felt that at work he was unable to carry charts, which prompted him to come to ED. In the ED,  patient reported much of his symptoms had improved and almost resolved, initially patient had CT brain which did not show any acute abnormality, the patient had MRI of the brain which did show tiny acute infarct in the posterior frontal lobe.Patient reports he has history of paroxysmal axial fibrillation, diagnosed 4-5 years ago, where he was on metoprolol for rate controlled, and he he was on aspirin. Patient denies any loss of consciousness ,confusion, altered mental status, slurred speech,dizziness , lightheadedness, chest pain, palpitation, patient was given 325 of aspirin in ED, EKG was normal sinus rhythm, bradycardic at 43, patient reports usually his heart rate in the low 50s or in the mid 40s which is his baseline.    Hospital Course:  Present on Admission:  .CVA (cerebral infarction); patient was found to have a small frontal infarct likely embolic from his atrial fibrillation. The patient has been started on [name] for secondary stroke prevention. He was also seen by physical therapy and occupational therapy and back to his baseline. The patient has no speech deficits and will not require any further therapy as an outpatient.  .DM (diabetes mellitus): Patient has a hemoglobin A1c of 6.2 which shows good control of his diabetes. I will defer to his primary care physician for further management.  .Paroxysmal atrial fibrillation: Patient has paroxysmal atrial flutter and has heart rate is well controlled. His current dose of metoprolol his heart rate was in the very low 40s. This dosage adjusted to 12.5 mg twice a day and I will defer to his primary care physician for further management.  .Neuropathy: The patient has diabetic neuropathy which does not appear to give him much pain but does cause him some imbalance at baseline. If the patient does start having pain he would benefit from a neuro analgesic agent. . Chronic mild systolic dysfunction: The patient has an ejection fraction of 40-45%  without any evidence of decompensation. I've advised the patient to followup with his cardiologist as an outpatient as he would likely have further decrease of his ejection fraction requiring medical intervention in the near future.    Disposition and Follow-up:  Patient to followup with his primary care physician Dr. Jeannetta Nap in one week. He's also for followup with Dr. Pearlean Brownie in 2 months. Discharge Orders    Future Orders Please Complete By Expires   Diet Carb Modified      Activity as tolerated - No restrictions         DISCHARGE EXAM:  General: Alert, awake, oriented x3, in no acute distress.  Vital signs:Blood pressure 148/68, pulse 48, temperature 97.9 F (36.6 C), temperature source Oral, resp. rate 18, height 5\' 10"  (1.778 m), weight 64.9 kg (143 lb 1.3 oz), SpO2 98.00%. Heart: Regular rate and rhythm, without murmurs, rubs, gallops.  Lungs: Clear to auscultation, no wheezing or rhonchi noted.  Abdomen: Soft, nontender, nondistended, positive bowel sounds  Neuro: Patient has 5/5 strength in bilateral lower extremities and bilateral upper extremity. Speech is fluent. Musculoskeletal: No warm swelling or erythema around joints, no spinal tenderness noted.  Psychiatric: Patient alert and oriented x3, good insight and cognition, good recent to remote recall     [facility] 04/01/12 0613 03/31/12 1214 03/31/12 1135  NA 141 142 --  K 4.2 3.9 --  CL 106 107 --  CO2 25 -- 25  GLUCOSE 114* 112* --  BUN 23 26* --  CREATININE 1.04 1.20 --  CALCIUM 9.3 -- 9.9  MG -- -- --  PHOS -- -- --    Basename 03/31/12 1135  AST 25  ALT 22  ALKPHOS 61  BILITOT 0.4  PROT 6.9  ALBUMIN 4.1   No results found for this basename: LIPASE:2,AMYLASE:2 in the last 72 hours  Basename 04/01/12 0613 03/31/12 1214 03/31/12 1135  WBC 6.5 -- 8.2  NEUTROABS -- -- 5.5  HGB 12.9* 13.9 --  HCT 38.2* 41.0 --  MCV 88.8 -- 88.5  PLT 126* -- 149*   Total time for discharge including face-to-face time  greater than 30 minutes Signed: MATTHEWS,MICHELLE A. 04/01/2012, 1:06 PM

## 2012-04-01 NOTE — Evaluation (Signed)
Physical Therapy Evaluation Patient Details Name: Darryl Navarro MRN: [id number] DOB: [birth year] Today's Date: 04/01/2012 Time: 4098-1191 PT Time Calculation (min): 26 min  PT Assessment / Plan / Recommendation Clinical Impression  [name] is 66 y/o male admitted for weakness of LUE which has resolved since admission. MRI showed a small right posterior frontal punctate type acute ischemic infarction. PT was initially consulted but upon conversation with pt and OT no further PT needs were identified and PT signed off. PT was again consulted after a conversation with MD who reported balance deficits with higher level challenging activities. Upon my evaluation today [name] presents sitting upright on the couch. All mobilty is independent. He has scored a 23/24 on the dynamic gait index. For full details of the balance assessment see below. He does have some difficulty with tandem walking and SLS however pt reports these are baseline for him (pt also has peripheral neuropathy which may be a contributing factor). Pt aware of the option for outpatient physical therapy to work on these balance deficits but pt declines. Pt educated on the benefits of beginning an exercise program for overall health, balance and to prevent further strokes. He verbalizes understanding and knowledge of how to begin an exercise program. No further PT needed at this time.      PT Assessment  Patent does not need any further PT services    Follow Up Recommendations  No PT follow up    Barriers to Discharge        Equipment Recommendations  None recommended by PT    Recommendations for Other Services     Frequency      Precautions / Restrictions Precautions Precautions: None         Mobility  Bed Mobility Bed Mobility: Not assessed Transfers Transfers: Stand to Sit;Sit to Stand Sit to Stand: 7: Independent Stand to Sit: 7: Independent Ambulation/Gait Ambulation/Gait Assistance: 7:  Independent Ambulation Distance (Feet): 800 Feet Assistive device: None Gait Pattern: Within Functional Limits Stairs: Yes Stairs Assistance: 7: Independent Stair Management Technique: No rails Number of Stairs: 12  Modified Rankin (Stroke Patients Only) Pre-Morbid Rankin Score: No symptoms    Exercises     PT Diagnosis:    PT Problem List:   PT Treatment Interventions:     PT Goals    Visit Information  Last PT Received On: 04/01/12 Assistance Needed: +1    Subjective Data  Subjective: I feel like Im back to normal.    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 Communication Communication: No difficulties    Cognition  Overall Cognitive Status: Appears within functional limits for tasks assessed/performed Arousal/Alertness: Awake/alert Orientation Level: Appears intact for tasks assessed Behavior During Session: [facility] for tasks performed    Extremity/Trunk Assessment Right Upper Extremity Assessment RUE ROM/Strength/Tone: [facility] for tasks assessed RUE Sensation: History of peripheral neuropathy RUE Coordination: WFL - gross/fine motor Left Upper Extremity Assessment LUE ROM/Strength/Tone: WFL for tasks assessed LUE Sensation: History of peripheral neuropathy LUE Coordination: WFL - gross/fine motor Right Lower Extremity Assessment RLE ROM/Strength/Tone: WFL for tasks assessed RLE Sensation: WFL - Light Touch;WFL - Proprioception RLE Coordination: WFL - gross/fine motor Left Lower Extremity Assessment LLE ROM/Strength/Tone: WFL for tasks assessed LLE Sensation: WFL - Proprioception;WFL - Light Touch LLE Coordination: WFL -  gross/fine motor Trunk Assessment Trunk Assessment: Normal    Balance Static Standing Balance Static Standing - Balance Support: No upper extremity supported Static Standing - Level of Assistance: 7: Independent Static Standing - Comment/# of Minutes: Able to lift either leg independently but unable to maintain longer than 10 seconds, pt reports this is baseline for him. Pt able to begin tandem stance independently but unable to maintain longer than 3-4 seconds, pt again reporting this is baseline for him.  Single Leg Stance - Right Leg: 10  Single Leg Stance - Left Leg: 10  Rhomberg - Eyes Opened: 60  Rhomberg - Eyes Closed: 60  (slight increase in sway but no LOB) Dynamic Standing Balance Dynamic Standing - Balance Support: No upper extremity supported Dynamic Standing - Level of Assistance: 7: Independent Dynamic Standing - Balance Activities: Ball toss;Reaching for objects;Forward lean/weight shifting;Lateral lean/weight shifting Dynamic Standing - Comments: Pt did attempt tandem walking with increased stagger but able to correct with stepping reaction independently  Standardized Balance Assessment Standardized Balance Assessment: Dynamic Gait Index Dynamic Gait Index Level Surface: Normal Change in Gait Speed: Normal Gait with Horizontal Head Turns: Normal Gait with Vertical Head Turns: Mild Impairment Gait and Pivot Turn: Normal Step Over Obstacle: Normal Step Around Obstacles: Normal Steps: Normal Total Score: 23  High Level Balance High Level Balance Activites: Direction changes;Turns;Sudden stops;Head turns;Backward walking High Level Balance Comments: [name] is ambulating at speeds adequate for community ambulation, able to avoid obstacles with good speed and appropriately scanning the environment. Able to reach to the floor pick up objects while ambulating without difficulty. Mild stagger looking up but reports some vertigo at baseline with this action. Able to perform multitask activities during gait i.e. while turning a corner,  pt looking at therapist while still engaging with other staff members all while holding both arms out to his side performing reciprocal bilateral trunk flexion/weight shift without any LOB or staggering. Pt able to perform ball tossing activities from 5 ft, 10 ft and 15 ft with no difficulties.   End of Session PT - End of Session Equipment Utilized During Treatment: Gait belt Activity Tolerance: Patient tolerated treatment well Patient left: in chair;Other (comment) (standing in pt's room) Nurse Communication: Mobility status  GP     [facility] HELEN 04/01/2012, 11:51 AM

## 2012-04-01 NOTE — Progress Notes (Signed)
Chart reviewed, and spoke with pt.  He is sitting on couch partially dressed, and reading the paper.  Pt. Reports that all deficits have resolved, and he feels he is back to normal.  He reports he is using Lt. UE normally, is manipulating objects independently, and numbness is at baseline (neuropathy).  He denies any other deficits.   He feels he does not need OT intervention, will sign off at this time.  Thanks!  Jeani Hawking, OTR/L [phone]

## 2012-04-01 NOTE — Progress Notes (Signed)
Patient was given verbal discharge instructions over the and was also faxed a copy of discharge instructions.  Patient's follow-up appts, new medications, continued meds, and meds that he should stop taking were discussed.  Patient was given stroke discharge information and education verbally over the phone as well.  Patient stated he did not have any questions.

## 2012-04-01 NOTE — Progress Notes (Signed)
PT Note  Spoke with OT who reports pt is back to baseline with no further PT needs. PT signing off.   Ivonne Andrew PT, DPT Pager: [phone]

## 2012-04-01 NOTE — Progress Notes (Signed)
  Echocardiogram 2D Echocardiogram has been performed.  Darryl Navarro FRANCES 04/01/2012, 9:49 AM

## 2012-04-01 NOTE — Progress Notes (Signed)
STROKE TEAM Progress Note   Patient ID: Pt is a 66 y.o. yo male with a PMHx of DM, paroxysmal  Afib (not on anticoagulation),  who was admitted on 04/24/12 for Weakness of left hand, which presented as symptoms of small right posterior frontal punctate type acute ischemic infarct   NIH score : 0 TPA: no. Minimal deficit and beyond time window  Subjective:   He is feeling great. No acute events over night. H/o PAF but never been on warfarin. Has seen Dr Nash Dimmer in past.  Objective:    Vital Signs:   Temp:  [97.3 F (36.3 C)-98.3 F (36.8 C)] 98 F (36.7 C) (07/26 0600) Pulse Rate:  [42-52] 48  (07/26 0600) Resp:  [16-20] 18  (07/26 0600) BP: (113-182)/(61-88) 119/66 mmHg (07/26 0600) SpO2:  [96 %-100 %] 98 % (07/26 0600) Weight:  [143 lb 1.3 oz (64.9 kg)] 143 lb 1.3 oz (64.9 kg) 2023/04/25 1945) Last BM Date: 04-24-12  Intake/Output:     Physical Exam: General Exam:   General: Vital signs reviewed and noted.   Lungs:  Normal respiratory effort. Clear to auscultation BL without crackles or wheezes.  Heart: RRR. S1 and S2 normal without gallop, murmur, or rubs.  Abdomen:  BS normoactive. Soft, Nondistended, non-tender.  No masses or organomegaly.  Extremities: No pretibial edema.     Neurologic Exam:   Mental Status: Alert, oriented, thought content appropriate.  Speech fluent without evidence of aphasia. Able to follow 3 step commands without difficulty.  Cranial Nerves:   II: Visual fields grossly intact.  III/IV/VI: Extraocular movements intact.    V/VII: Smile symmetric with minimal left nasolabial fold asymmetry.. facial light touch sensation normal bilaterally.  VIII: Grossly intact.  IX/X: Normal gag.  XI: Bilateral shoulder shrug normal.  XII: Midline tongue extension normal.  Motor:  5/5 bilaterally with normal tone and bulk.Diminished fine motor skills left hand with orbiting right over left hand.  Sensory:  Light touch intact throughout, bilaterally  DTRs: 2+  and symmetric throughout  Plantars:  Downgoing bilaterally  Cerebellar: Normal finger-to-nose, normal rapid alternating movements      Labs: Basic Metabolic Panel:  Lab 04-24-12 8657 April 24, 2012 1135  NA 142 141  K 3.9 4.0  CL 107 105  CO2 -- 25  GLUCOSE 112* 111*  BUN 26* 25*  CREATININE 1.20 1.18  CALCIUM -- 9.9  MG -- --  PHOS -- --    Liver Function Tests:  Lab April 24, 2012 1135  AST 25  ALT 22  ALKPHOS 61  BILITOT 0.4  PROT 6.9  ALBUMIN 4.1    CBC:  Lab 04/01/12 0613 April 24, 2012 1214 04-24-2012 1135  WBC 6.5 -- 8.2  NEUTROABS -- -- 5.5  HGB 12.9* 13.9 13.9  HCT 38.2* 41.0 40.1  MCV 88.8 -- 88.5  PLT 126* -- 149*    Cardiac Enzymes:  Lab April 24, 2012 1409 04/24/2012 1136  CKTOTAL 300* 336*  CKMB 10.8* 12.2*  CKMBINDEX -- --  TROPONINI <0.30 <0.30     CBG:  Lab 04/01/12 0644 2012-04-24 1134  GLUCAP 112* 110*    Microbiology: No results found for this or any previous visit.  Coagulation Studies:  Basename 04/01/12 8469 04-24-2012 1135  LABPROT 12.7 14.1  INR 0.93 1.07      Imaging: Ct Head Wo Contrast  24-Apr-2012  *RADIOLOGY REPORT*  Clinical Data: Extremity weakness.  CT HEAD WITHOUT CONTRAST  Technique:  Contiguous axial images were obtained from the base of the skull through the vertex without  contrast.  Comparison: None.  Findings: No mass lesion, mass effect, midline shift, hydrocephalus, hemorrhage.  No territorial ischemia or acute infarction.  Bilateral mucous retention cysts or polyps are present within the maxillary sinuses.  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  IMPRESSION: Negative CT head.  Original Report Authenticated By: Andreas Newport, M.D.   Mr [facility] Wo Contrast  03/31/2012  *RADIOLOGY REPORT*  Clinical Data:  Left sided numbness.  Discoordination. Extremity weakness.  Diabetes.  Atrial fibrillation.  MRI BRAIN WITHOUT CONTRAST MRA HEAD WITHOUT CONTRAST MRA NECK WITHOUT AND WITH CONTRAST  Technique: Multiplanar, multiecho  pulse sequences of the brain and surrounding structures were obtained according to standard protocol without intravenous contrast.  Angiographic images of the Circle of Willis were obtained using MRA technique without intravenous contrast.  Angiographic images of the neck were obtained using MRA technique without and with intravenous contrast.  Contrast:15 ml MultiHance.  Comparison:03/31/2012 head CT.  No comparison brain MR.  MRI HEAD  Findings: Tiny acute non hemorrhagic infarct posterior right frontal lobe.  Mild white matter type changes consistent with result of small vessel disease.  No intracranial hemorrhage.  No intracranial mass lesion detected on this unenhanced exam.  Mild to atrophy without hydrocephalus.  Partially empty sella incidentally noted.  Polypoid opacification maxillary sinuses.  Mild mucosal thickening ethmoid sinus air cells and sphenoid sinus air cells.  IMPRESSION: Tiny acute non hemorrhagic infarct posterior right frontal lobe.  Please see above.  MRA HEAD  Findings:Anterior circulation without medium or large size vessel significant stenosis or occlusion.  Fetal type origin right posterior cerebral artery.  Mild middle cerebral artery branch vessel irregularity bilaterally.  No significant stenosis of the distal vertebral arteries or basilar artery.  Nonvisualization right PICA.  Poor delineation majority of the left AICA.  Mild irregularity distal posterior cerebral artery bilaterally.  Minimal bulge of the basilar tip probably represents tortuous vessel without aneurysm or vascular malformation noted.  IMPRESSION: Mild branch vessel irregularity as noted above.  MRA NECK  Findings:Mild irregularity of the proximal internal carotid artery bilaterally without measurable significant stenosis.  Left vertebral artery arises directly from the aortic arch. Proximal aspect slightly ectatic with minimal irregularity but without high-grade stenosis.  Two vessels traverse to supply the mid  right vertebral artery.  The proximal aspect of these branches is slightly ectatic with minimal to mild narrowing.  IMPRESSION: Mild irregularity of the proximal internal carotid artery bilaterally without measurable significant stenosis.  Left vertebral artery arises directly from the aortic arch. Proximal aspect slightly ectatic with minimal irregularity but without high-grade stenosis.  Two vessels traverse to supply the mid right vertebral artery.  The proximal aspect of these branches is slightly ectatic with minimal to mild narrowing.  Critical Value/emergent results were called by telephone at the time of interpretation on 03/31/2012 at 4:55 p.m. to Dr. Manus Gunning, who verbally acknowledged these results.  Original Report Authenticated By: Fuller Canada, M.D.   Mr Angiogram Neck W Wo Contrast  03/31/2012  *RADIOLOGY REPORT*  Clinical Data:  Left sided numbness.  Discoordination. Extremity weakness.  Diabetes.  Atrial fibrillation.  MRI BRAIN WITHOUT CONTRAST MRA HEAD WITHOUT CONTRAST MRA NECK WITHOUT AND WITH CONTRAST  Technique: Multiplanar, multiecho pulse sequences of the brain and surrounding structures were obtained according to standard protocol without intravenous contrast.  Angiographic images of the Circle of Willis were obtained using MRA technique without intravenous contrast.  Angiographic images of the neck were obtained using MRA technique without and  with intravenous contrast.  Contrast:15 ml MultiHance.  Comparison:03/31/2012 head CT.  No comparison brain MR.  MRI HEAD  Findings: Tiny acute non hemorrhagic infarct posterior right frontal lobe.  Mild white matter type changes consistent with result of small vessel disease.  No intracranial hemorrhage.  No intracranial mass lesion detected on this unenhanced exam.  Mild to atrophy without hydrocephalus.  Partially empty sella incidentally noted.  Polypoid opacification maxillary sinuses.  Mild mucosal thickening ethmoid sinus air cells and  sphenoid sinus air cells.  IMPRESSION: Tiny acute non hemorrhagic infarct posterior right frontal lobe.  Please see above.  MRA HEAD  Findings:Anterior circulation without medium or large size vessel significant stenosis or occlusion.  Fetal type origin right posterior cerebral artery.  Mild middle cerebral artery branch vessel irregularity bilaterally.  No significant stenosis of the distal vertebral arteries or basilar artery.  Nonvisualization right PICA.  Poor delineation majority of the left AICA.  Mild irregularity distal posterior cerebral artery bilaterally.  Minimal bulge of the basilar tip probably represents tortuous vessel without aneurysm or vascular malformation noted.  IMPRESSION: Mild branch vessel irregularity as noted above.  MRA NECK  Findings:Mild irregularity of the proximal internal carotid artery bilaterally without measurable significant stenosis.  Left vertebral artery arises directly from the aortic arch. Proximal aspect slightly ectatic with minimal irregularity but without high-grade stenosis.  Two vessels traverse to supply the mid right vertebral artery.  The proximal aspect of these branches is slightly ectatic with minimal to mild narrowing.  IMPRESSION: Mild irregularity of the proximal internal carotid artery bilaterally without measurable significant stenosis.  Left vertebral artery arises directly from the aortic arch. Proximal aspect slightly ectatic with minimal irregularity but without high-grade stenosis.  Two vessels traverse to supply the mid right vertebral artery.  The proximal aspect of these branches is slightly ectatic with minimal to mild narrowing.  Critical Value/emergent results were called by telephone at the time of interpretation on 03/31/2012 at 4:55 p.m. to Dr. Manus Gunning, who verbally acknowledged these results.  Original Report Authenticated By: Fuller Canada, M.D.   Mr Brain Wo Contrast  03/31/2012  *RADIOLOGY REPORT*  Clinical Data:  Left sided numbness.   Discoordination. Extremity weakness.  Diabetes.  Atrial fibrillation.  MRI BRAIN WITHOUT CONTRAST MRA HEAD WITHOUT CONTRAST MRA NECK WITHOUT AND WITH CONTRAST  Technique: Multiplanar, multiecho pulse sequences of the brain and surrounding structures were obtained according to standard protocol without intravenous contrast.  Angiographic images of the Circle of Willis were obtained using MRA technique without intravenous contrast.  Angiographic images of the neck were obtained using MRA technique without and with intravenous contrast.  Contrast:15 ml MultiHance.  Comparison:03/31/2012 head CT.  No comparison brain MR.  MRI HEAD  Findings: Tiny acute non hemorrhagic infarct posterior right frontal lobe.  Mild white matter type changes consistent with result of small vessel disease.  No intracranial hemorrhage.  No intracranial mass lesion detected on this unenhanced exam.  Mild to atrophy without hydrocephalus.  Partially empty sella incidentally noted.  Polypoid opacification maxillary sinuses.  Mild mucosal thickening ethmoid sinus air cells and sphenoid sinus air cells.  IMPRESSION: Tiny acute non hemorrhagic infarct posterior right frontal lobe.  Please see above.  MRA HEAD  Findings:Anterior circulation without medium or large size vessel significant stenosis or occlusion.  Fetal type origin right posterior cerebral artery.  Mild middle cerebral artery branch vessel irregularity bilaterally.  No significant stenosis of the distal vertebral arteries or basilar artery.  Nonvisualization right PICA.  Poor delineation majority of the left AICA.  Mild irregularity distal posterior cerebral artery bilaterally.  Minimal bulge of the basilar tip probably represents tortuous vessel without aneurysm or vascular malformation noted.  IMPRESSION: Mild branch vessel irregularity as noted above.  MRA NECK  Findings:Mild irregularity of the proximal internal carotid artery bilaterally without measurable significant stenosis.   Left vertebral artery arises directly from the aortic arch. Proximal aspect slightly ectatic with minimal irregularity but without high-grade stenosis.  Two vessels traverse to supply the mid right vertebral artery.  The proximal aspect of these branches is slightly ectatic with minimal to mild narrowing.  IMPRESSION: Mild irregularity of the proximal internal carotid artery bilaterally without measurable significant stenosis.  Left vertebral artery arises directly from the aortic arch. Proximal aspect slightly ectatic with minimal irregularity but without high-grade stenosis.  Two vessels traverse to supply the mid right vertebral artery.  The proximal aspect of these branches is slightly ectatic with minimal to mild narrowing.  Critical Value/emergent results were called by telephone at the time of interpretation on 03/31/2012 at 4:55 p.m. to Dr. Manus Gunning, who verbally acknowledged these results.  Original Report Authenticated By: Fuller Canada, M.D.      Medications:    Infusions:    Scheduled Medications:    . aspirin  324 mg Oral Once  . coumadin book   Does not apply Once  . gadobenate dimeglumine  15 mL Intravenous Once  . heparin subcutaneous  5,000 Units Subcutaneous Q8H  . insulin aspart  0-15 Units Subcutaneous TID WC  . metoprolol tartrate  12.5 mg Oral BID  . sodium chloride  3 mL Intravenous Q12H  . warfarin  7.5 mg Oral Once  . warfarin   Does not apply Once  . Warfarin - Pharmacist Dosing Inpatient   Does not apply q1800  . DISCONTD: aspirin  81 mg Oral Daily  . DISCONTD: aspirin EC  81 mg Oral Daily  . DISCONTD: aspirin EC  81 mg Oral Daily  . DISCONTD: enoxaparin (LOVENOX) injection  1 mg/kg Subcutaneous Q12H  . DISCONTD: metFORMIN  1,000 mg Oral BID WC  . DISCONTD: metoprolol  50 mg Oral BID    PRN Medications: alum & mag hydroxide-simeth, HYDROcodone-acetaminophen, ondansetron (ZOFRAN) IV, ondansetron, polyethylene glycol, DISCONTD: ibuprofen   Assessment/ Plan:     Pt is a 66 y.o. yo male with a PMHx of DM, Afib (not on anticoagulation),  who was admitted on 03/31/2012 for Weakness of left hand, which presented as symptoms of small right posterior frontal punctate type acute ischemic infarct likely embolic from atrial fibrillation.  1. 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 Paroxysmal  Afib ( was not on anticoagulation prior to admission) 3. Diabetes Mellitus - Hgb A1c 6.2  Plan: 1. Change aspirin to [name] . Discussed side effects with patient and advised outpatient f/u with me. 2. Awaiting 2 D echo  3. F/u with Cardiology as an outpatient  4. F/u with Dr Pearlean Brownie in 2 month  5. D/W Dr Valora Piccolo of Stay: 1 day(s)  Patient history and plan of care reviewed with attending, Dr. Delia Heady.   Signed: Almyra Deforest, MD PGY-III, Internal Medicine Resident Pager: [phone] (7AM-5PM) 04/01/2012, 8:15 AM    Delia Heady, MD Medical Director [facility] Pager: [phone] 04/01/2012 1:07 PM

## 2012-04-04 NOTE — Care Management Note (Signed)
    Page 1 of 1   04/04/2012     9:15:17 AM   CARE MANAGEMENT NOTE 04/04/2012  Patient:  Darryl Navarro, Darryl Navarro   Account Number:  [id number]  Date Initiated:  04/01/2012  Documentation initiated by:  Jacquelynn Cree  Subjective/Objective Assessment:   Admitted with CVA, left arm weakness.     Action/Plan:   PT eval- no d/c needs  OT eval-no d/c needs   Anticipated DC Date:  04/02/2012   Anticipated DC Plan:  HOME/SELF CARE      DC Planning Services  CM consult      Choice offered to / List presented to:             Status of service:  Completed, signed off Medicare Important Message given?   (If response is "NO", the following Medicare IM given date fields will be blank) Date Medicare IM given:   Date Additional Medicare IM given:    Discharge Disposition:  HOME/SELF CARE  Per UR Regulation:  Reviewed for med. necessity/level of care/duration of stay  If discussed at Long Length of Stay Meetings, dates discussed:    Comments:

## 2012-05-02 ENCOUNTER — Encounter: Payer: Self-pay | Admitting: *Deleted

## 2012-05-02 ENCOUNTER — Encounter: Payer: Self-pay | Admitting: Cardiology

## 2012-05-03 ENCOUNTER — Encounter: Payer: Self-pay | Admitting: Cardiology

## 2012-05-03 ENCOUNTER — Ambulatory Visit (INDEPENDENT_AMBULATORY_CARE_PROVIDER_SITE_OTHER): Payer: BC Managed Care – PPO | Admitting: Cardiology

## 2012-05-03 VITALS — BP 124/70 | HR 52 | Ht 70.0 in | Wt 178.0 lb

## 2012-05-03 DIAGNOSIS — I480 Paroxysmal atrial fibrillation: Secondary | ICD-10-CM

## 2012-05-03 DIAGNOSIS — R0989 Other specified symptoms and signs involving the circulatory and respiratory systems: Secondary | ICD-10-CM

## 2012-05-03 DIAGNOSIS — I4891 Unspecified atrial fibrillation: Secondary | ICD-10-CM

## 2012-05-03 DIAGNOSIS — I429 Cardiomyopathy, unspecified: Secondary | ICD-10-CM

## 2012-05-03 HISTORY — DX: Cardiomyopathy, unspecified: I42.9

## 2012-05-03 LAB — TSH: TSH: 0.36 u[IU]/mL (ref 0.35–5.50)

## 2012-05-03 NOTE — Assessment & Plan Note (Signed)
Ejection fraction 40-45% on recent echocardiogram. Arrange Myoview to screen for coronary disease. Check TSH. Continue beta blocker. Will consider ACE inhibitor in the future pending ejection fraction on nuclear study.

## 2012-05-03 NOTE — Patient Instructions (Signed)
Your physician wants you to follow-up in: 6 MONTHS WITH DR Jens Som You will receive a reminder letter in the mail two months in advance. If you don't receive a letter, please call our office to schedule the follow-up appointment.   Your physician has requested that you have en exercise stress myoview. For further information please visit [email or web address] Please follow instruction sheet, as given.   Your physician recommends that you HAVE LAB WORK TODAY  Your physician recommends that you return for lab work in: 3 MONTHS-CBC AND BMP-427.31  Your physician has requested that you have an abdominal aorta duplex. During this test, an ultrasound is used to evaluate the aorta. Allow 30 minutes for this exam. Do not eat after midnight the day before and avoid carbonated beverages

## 2012-05-03 NOTE — Assessment & Plan Note (Signed)
The patient has paroxysmal atrial fibrillation. He remains in sinus rhythm today on electrocardiogram. Continue Toprol. His episodes are relatively infrequent. We discussed the addition of antiarrhythmic. However these do not appear to be particularly bothersome at this point. He will consider an antiarrhythmic in the future if his symptoms worsen. Check TSH. Embolic risk factors of diabetes mellitus and recent CVA. Continue [name]. Check hemoglobin and renal function in 3 months. We will obtain previous records concerning documentation of his atrial fibrillation.

## 2012-05-03 NOTE — Progress Notes (Signed)
  HPI: 66 year old male for evaluation of atrial fibrillation and cardiomyopathy. Patient was recently admitted to [facility] with complaints of left upper extremity weakness. MRI revealed tiny acute infarct in the posterior frontal lobe. Electrocardiogram showed sinus bradycardia with heart rate in the low 40s. No ST changes. Given his history of paroxysmal atrial fibrillation the patient was started on [name]. Echocardiogram showed an ejection fraction of 40-45% and diffuse hypokinesis. There was moderate left ventricular hypertrophy. The left atrium and right atrium were mildly dilated. The right ventricle was mildly dilated. Patient has had intermittent palpitations since 2006. He was seen at [facility] cardiology at that time and diagnosed with paroxysmal atrial fibrillation. He has palpitations frequently. These last minutes to hours and resolve spontaneously. He has no associated symptoms other than palpitations. There is no dyspnea, chest pain, syncope or bleeding.  Current Outpatient Prescriptions  Medication Sig Dispense Refill  . dabigatran ([name]) 150 MG CAPS Take 1 capsule (150 mg total) by mouth every 12 (twelve) hours.  60 capsule  1  . ibuprofen (ADVIL,MOTRIN) 200 MG tablet Take 800 mg by mouth every 6 (six) hours as needed. For pain      . metFORMIN (GLUCOPHAGE) 1000 MG tablet Take 1,000 mg by mouth 2 (two) times daily with a meal.      . metoprolol tartrate (LOPRESSOR) 25 MG tablet Take 25 mg by mouth daily.        No Known Allergies  Past Medical History  Diagnosis Date  . Diabetes mellitus   . Paroxysmal atrial fibrillation   . Neuropathy   . CVA (cerebral infarction)     Past Surgical History  Procedure Date  . Knee surgery     Bilateral    History   Social History  . Marital Status: Married    Spouse Name: N/A    Number of Children: 2  . Years of Education: N/A   Occupational History  . PHYSICIAN ASSISTANT     [employer]   Social History Main  Topics  . Smoking status: Former Games developer  . Smokeless tobacco: Not on file  . Alcohol Use: Yes     Occasional  . Drug Use: No  . Sexually Active: Not on file   Other Topics Concern  . Not on file   Social History Narrative  . No narrative on file    Family History  Problem Relation Age of Onset  . Coronary artery disease Father     MI in his 63s    ROS: no fevers or chills, productive cough, hemoptysis, dysphasia, odynophagia, melena, hematochezia, dysuria, hematuria, rash, seizure activity, orthopnea, PND, pedal edema, claudication. Remaining systems are negative.  Physical Exam:  Blood pressure 124/70, pulse 52, height 5\' 10"  (1.778 m), weight 178 lb (80.74 kg).  General:  Well developed/well nourished in NAD Skin warm/dry Patient not depressed No peripheral clubbing Back-normal HEENT-normal/normal eyelids Neck supple/normal carotid upstroke bilaterally; no bruits; no JVD; no thyromegaly chest - CTA/ normal expansion CV - RRR/normal S1 and S2; no murmurs, rubs or gallops;  PMI nondisplaced Abdomen -NT/ND, no HSM, no mass, + bowel sounds, positive bruit 2+ femoral pulses, no bruits Ext-no edema, chords, 2+ DP Neuro-grossly nonfocal  ECG sinus bradycardia at a rate of 52. No ST changes.

## 2012-05-03 NOTE — Assessment & Plan Note (Signed)
Check abdominal ultrasound to exclude aneurysm. 

## 2012-05-16 ENCOUNTER — Encounter (INDEPENDENT_AMBULATORY_CARE_PROVIDER_SITE_OTHER): Payer: BC Managed Care – PPO

## 2012-05-16 DIAGNOSIS — I700 Atherosclerosis of aorta: Secondary | ICD-10-CM

## 2012-05-16 DIAGNOSIS — R0989 Other specified symptoms and signs involving the circulatory and respiratory systems: Secondary | ICD-10-CM

## 2012-05-18 ENCOUNTER — Ambulatory Visit (HOSPITAL_COMMUNITY): Payer: BC Managed Care – PPO | Attending: Cardiology | Admitting: Radiology

## 2012-05-18 VITALS — BP 136/73 | Ht 70.0 in | Wt 166.0 lb

## 2012-05-18 DIAGNOSIS — I4891 Unspecified atrial fibrillation: Secondary | ICD-10-CM | POA: Insufficient documentation

## 2012-05-18 DIAGNOSIS — E119 Type 2 diabetes mellitus without complications: Secondary | ICD-10-CM | POA: Insufficient documentation

## 2012-05-18 DIAGNOSIS — Z87891 Personal history of nicotine dependence: Secondary | ICD-10-CM | POA: Insufficient documentation

## 2012-05-18 DIAGNOSIS — I428 Other cardiomyopathies: Secondary | ICD-10-CM | POA: Insufficient documentation

## 2012-05-18 DIAGNOSIS — I429 Cardiomyopathy, unspecified: Secondary | ICD-10-CM

## 2012-05-18 DIAGNOSIS — R002 Palpitations: Secondary | ICD-10-CM | POA: Insufficient documentation

## 2012-05-18 DIAGNOSIS — I480 Paroxysmal atrial fibrillation: Secondary | ICD-10-CM

## 2012-05-18 DIAGNOSIS — Z8249 Family history of ischemic heart disease and other diseases of the circulatory system: Secondary | ICD-10-CM | POA: Insufficient documentation

## 2012-05-18 DIAGNOSIS — I491 Atrial premature depolarization: Secondary | ICD-10-CM

## 2012-05-18 MED ORDER — TECHNETIUM TC 99M SESTAMIBI GENERIC - CARDIOLITE
30.0000 | Freq: Once | INTRAVENOUS | Status: AC | PRN
  Administered 2012-05-18: 30 via INTRAVENOUS

## 2012-05-18 MED ORDER — TECHNETIUM TC 99M SESTAMIBI GENERIC - CARDIOLITE
10.0000 | Freq: Once | INTRAVENOUS | Status: AC | PRN
  Administered 2012-05-18: 10 via INTRAVENOUS

## 2012-05-18 NOTE — Progress Notes (Signed)
[facility] SITE 3 NUCLEAR MED [address] [place] [address] [phone]  Cardiology Nuclear Med Study  Darryl Navarro Age is a 66 y.o. male     MRN : [id number]     DOB: 1945/11/11  Procedure Date: 05/18/2012  Nuclear Med Background Indication for Stress Test:  Evaluation for Ischemia due to AFIB/Cardiomyopathy History:  AFIB Cardiomyopathy, 04/01/12 ECHO: 40-45% mod LVH diffuse hypokinesis Cardiac Risk Factors: CVA, Family History - CAD, History of Smoking and NIDDM  Symptoms:  Palpitations   Nuclear Pre-Procedure Caffeine/Decaff Intake:  None > 12 hrs NPO After: 7:00pm   Lungs:  clear O2 Sat: 97% on room air. IV 0.9% NS with Angio Cath:  20g  IV Site: R Antecubital x 1, tolerated well IV Started by:  Irean Hong, RN  Chest Size (in):  42 Cup Size: n/a  Height: 5\' 10"  (1.778 m)  Weight:  166 lb (75.297 kg)  BMI:  Body mass index is 23.82 kg/(m^2). Tech Comments:  Held lopressor x 24 hrs    Nuclear Med Study 1 or 2 day study: 1 day  Stress Test Type:  Stress  Reading MD: Willa Rough, MD  Order Authorizing Provider:  Olga Millers, MD  Resting Radionuclide: Technetium 36m Sestamibi  Resting Radionuclide Dose: 11.0 mCi   Stress Radionuclide:  Technetium 55m Sestamibi  Stress Radionuclide Dose: 33.0 mCi           Stress Protocol Rest HR: 45 Stress HR: 130  Rest BP: 136/73 Stress BP: 183/81  Exercise Time (min): 10:30 METS: 10.10   Predicted Max HR: 152 bpm % Max HR: 85.53 bpm Rate Pressure Product: 64403   Dose of Adenosine (mg):  n/a Dose of Lexiscan: n/a mg  Dose of Atropine (mg): n/a Dose of Dobutamine: n/a mcg/kg/min (at max HR)  Stress Test Technologist: Milana Na, EMT-P  Nuclear Technologist:  Domenic Polite, CNMT     Rest Procedure:  Myocardial perfusion imaging was performed at rest 45 minutes following the intravenous administration of Technetium 29m Sestamibi. Rest ECG: Sinus Bradycardia  Stress Procedure:  The patient  performed treadmill exercise using a Bruce  Protocol for 10:30 minutes. The patient stopped due to fatigue and denied any chest pain.  There were non specific ST-T wave changes and rare pacs.  Technetium 56m Sestamibi was injected at peak exercise and myocardial perfusion imaging was performed after a brief delay. Stress ECG: No significant ST segment change suggestive of ischemia.  QPS Raw Data Images:  Patient motion noted; appropriate software correction applied. Stress Images:  Normal homogeneous uptake in all areas of the myocardium. Rest Images:  Normal homogeneous uptake in all areas of the myocardium. Subtraction (SDS):  No evidence of ischemia. Transient Ischemic Dilatation (Normal <1.22):  1.13 Lung/Heart Ratio (Normal <0.45):  0.33  Quantitative Gated Spect Images QGS EDV:  147 ml QGS ESV:  69 ml  Impression Exercise Capacity:  Good exercise capacity. BP Response:  Normal blood pressure response. Clinical Symptoms:  No significant symptoms noted. ECG Impression:  No significant ST segment change suggestive of ischemia. Comparison with Prior Nuclear Study: No images to compare  Overall Impression:  Normal stress nuclear study.  LV Ejection Fraction: 53%.  LV Wall Motion:  LV function is low normal  Willa Rough, MD

## 2013-03-31 ENCOUNTER — Encounter: Payer: Self-pay | Admitting: Neurology

## 2013-04-05 ENCOUNTER — Ambulatory Visit (INDEPENDENT_AMBULATORY_CARE_PROVIDER_SITE_OTHER): Payer: Medicare Other | Admitting: Neurology

## 2013-04-05 ENCOUNTER — Encounter: Payer: Self-pay | Admitting: Neurology

## 2013-04-05 VITALS — BP 124/71 | HR 70 | Ht 71.0 in | Wt 177.0 lb

## 2013-04-05 DIAGNOSIS — G454 Transient global amnesia: Secondary | ICD-10-CM

## 2013-04-05 DIAGNOSIS — R413 Other amnesia: Secondary | ICD-10-CM

## 2013-04-05 HISTORY — DX: Transient global amnesia: G45.4
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
## 2013-04-05 NOTE — Patient Instructions (Addendum)
Check MRI scan of the brain and EEG for these transient episodes of memory loss. Continue [name] for stroke prevention for atrial fibrillation and maintain strict control of diabetes with A1c go to 6.2%. Return for followup in 3 months with Darryl Guile, NP call earlier if necessary

## 2013-04-12 ENCOUNTER — Other Ambulatory Visit (INDEPENDENT_AMBULATORY_CARE_PROVIDER_SITE_OTHER): Payer: Medicare Other | Admitting: Radiology

## 2013-04-12 DIAGNOSIS — G454 Transient global amnesia: Secondary | ICD-10-CM

## 2013-04-12 DIAGNOSIS — R413 Other amnesia: Secondary | ICD-10-CM

## 2013-05-03 DIAGNOSIS — R413 Other amnesia: Secondary | ICD-10-CM

## 2013-05-06 ENCOUNTER — Other Ambulatory Visit: Payer: Self-pay | Admitting: Neurology

## 2013-05-06 DIAGNOSIS — R413 Other amnesia: Secondary | ICD-10-CM

## 2013-05-11 ENCOUNTER — Telehealth: Payer: Self-pay | Admitting: Neurology

## 2013-05-12 ENCOUNTER — Telehealth: Payer: Self-pay | Admitting: Neurology

## 2013-05-12 NOTE — Telephone Encounter (Signed)
Spoke to patient. Gave EEG results. Advised would consult w/ Dr. Pearlean Brownie for MRI results. Patient agreed.

## 2013-05-12 NOTE — Telephone Encounter (Signed)
Duplicate telephone message. Closing encounter.

## 2013-05-12 NOTE — Telephone Encounter (Signed)
Returned call. Patient not available. Left message w/ secretary to return call.

## 2013-05-22 ENCOUNTER — Telehealth: Payer: Self-pay | Admitting: Neurology

## 2013-05-24 NOTE — Telephone Encounter (Signed)
I spoke to the patient today and gave results of the MRI and answered his questions.

## 2013-07-17 ENCOUNTER — Encounter: Payer: Self-pay | Admitting: Nurse Practitioner

## 2013-07-17 ENCOUNTER — Ambulatory Visit (INDEPENDENT_AMBULATORY_CARE_PROVIDER_SITE_OTHER): Payer: Medicare Other | Admitting: Nurse Practitioner

## 2013-07-17 ENCOUNTER — Encounter (INDEPENDENT_AMBULATORY_CARE_PROVIDER_SITE_OTHER): Payer: Self-pay

## 2013-07-17 VITALS — BP 153/80 | HR 46 | Temp 97.8°F | Ht 71.0 in | Wt 171.0 lb

## 2013-07-17 DIAGNOSIS — G454 Transient global amnesia: Secondary | ICD-10-CM

## 2013-07-17 DIAGNOSIS — I6350 Cerebral infarction due to unspecified occlusion or stenosis of unspecified cerebral artery: Secondary | ICD-10-CM

## 2013-07-17 DIAGNOSIS — I639 Cerebral infarction, unspecified: Secondary | ICD-10-CM
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
## 2013-07-17 NOTE — Patient Instructions (Signed)
Continue [name] for stroke prevention for atrial fibrillation and maintain strict control of diabetes with A1c goal below to 6.5%. Return for followup in 6-8 months with Darryl Guile, NP call earlier if necessary

## 2013-12-07 ENCOUNTER — Other Ambulatory Visit: Payer: Self-pay

## 2013-12-07 MED ORDER — DABIGATRAN ETEXILATE MESYLATE 150 MG PO CAPS: 150.0000 mg | ORAL_CAPSULE | Freq: Two times a day (BID) | ORAL | Status: DC

## 2014-01-15 ENCOUNTER — Ambulatory Visit: Payer: Medicare Other | Admitting: Nurse Practitioner

## 2014-04-03 ENCOUNTER — Encounter: Payer: Self-pay | Admitting: Nurse Practitioner

## 2014-04-03 ENCOUNTER — Ambulatory Visit (INDEPENDENT_AMBULATORY_CARE_PROVIDER_SITE_OTHER): Payer: Medicare Other | Admitting: Nurse Practitioner

## 2014-04-03 VITALS — BP 110/80 | HR 76 | Ht 71.0 in | Wt 167.0 lb

## 2014-04-03 DIAGNOSIS — I480 Paroxysmal atrial fibrillation: Secondary | ICD-10-CM

## 2014-04-03 DIAGNOSIS — G454 Transient global amnesia: Secondary | ICD-10-CM

## 2014-04-03 DIAGNOSIS — I4891 Unspecified atrial fibrillation: Secondary | ICD-10-CM

## 2014-04-03 DIAGNOSIS — Z8673 Personal history of transient ischemic attack (TIA), and cerebral infarction without residual deficits: Secondary | ICD-10-CM
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
## 2014-04-03 NOTE — Patient Instructions (Signed)
Continue [name] for stroke prevention for atrial fibrillation and maintain strict control of diabetes with A1c goal below 7%. Return for followup in 6 months with Dr. Leonie Man or call earlier if necessary.   Stroke Prevention Some medical conditions and behaviors are associated with an increased chance of having a stroke. You may prevent a stroke by making healthy choices and managing medical conditions. HOW CAN I REDUCE MY RISK OF HAVING A STROKE?   Stay physically active. Get at least 30 minutes of activity on most or all days.  Do not smoke. It may also be helpful to avoid exposure to secondhand smoke.  Limit alcohol use. Moderate alcohol use is considered to be:  No more than 2 drinks per day for men.  No more than 1 drink per day for nonpregnant women.  Eat healthy foods. This involves:  Eating 5 or more servings of fruits and vegetables a day.  Making dietary changes that address high blood pressure (hypertension), high cholesterol, diabetes, or obesity.  Manage your cholesterol levels.  Making food choices that are high in fiber and low in saturated fat, trans fat, and cholesterol may control cholesterol levels.  Take any prescribed medicines to control cholesterol as directed by your health care provider.  Manage your diabetes.  Controlling your carbohydrate and sugar intake is recommended to manage diabetes.  Take any prescribed medicines to control diabetes as directed by your health care provider.  Control your hypertension.  Making food choices that are low in salt (sodium), saturated fat, trans fat, and cholesterol is recommended to manage hypertension.  Take any prescribed medicines to control hypertension as directed by your health care provider.  Maintain a healthy weight.  Reducing calorie intake and making food choices that are low in sodium, saturated fat, trans fat, and cholesterol are recommended to manage weight.  Stop drug abuse.  Avoid taking birth  control pills.  Talk to your health care provider about the risks of taking birth control pills if you are over 76 years old, smoke, get migraines, or have ever had a blood clot.  Get evaluated for sleep disorders (sleep apnea).  Talk to your health care provider about getting a sleep evaluation if you snore a lot or have excessive sleepiness.  Take medicines only as directed by your health care provider.  For some people, aspirin or blood thinners (anticoagulants) are helpful in reducing the risk of forming abnormal blood clots that can lead to stroke. If you have the irregular heart rhythm of atrial fibrillation, you should be on a blood thinner unless there is a good reason you cannot take them.  Understand all your medicine instructions.  Make sure that other conditions (such as anemia or atherosclerosis) are addressed. SEEK IMMEDIATE MEDICAL CARE IF:   You have sudden weakness or numbness of the face, arm, or leg, especially on one side of the body.  Your face or eyelid droops to one side.  You have sudden confusion.  You have trouble speaking (aphasia) or understanding.  You have sudden trouble seeing in one or both eyes.  You have sudden trouble walking.  You have dizziness.  You have a loss of balance or coordination.  You have a sudden, severe headache with no known cause.  You have new chest pain or an irregular heartbeat. Any of these symptoms may represent a serious problem that is an emergency. Do not wait to see if the symptoms will go away. Get medical help at once. Call your local emergency  services (911 in [place] Do not drive yourself to the hospital. Document Released: 10/01/2004 Document Revised: 01/08/2014 Document Reviewed: 02/24/2013 [facility] Patient Information 2015 [place], [place]. This information is not intended to replace advice given to you by your health care provider. Make sure you discuss any questions you have with your health care provider.

## 2014-04-03 NOTE — Progress Notes (Signed)
I agree with the above plan 

## 2014-05-11 IMAGING — CT CT HEAD W/O CM
1 series · 16 of 30 positions shown, 20 images · non-contrast
Comparison: None.

CLINICAL DATA: Extremity weakness.

CT HEAD WITHOUT CONTRAST
TECHNIQUE: Contiguous axial images were obtained from the base of
the skull through the vertex without contrast.

[Series 2: head routine 4.8 h37s · axial · 0.48mm/px · z∈[-117,+67]mm · 16 of 42 slices shown, 20 images]
[im 2/42  brain]
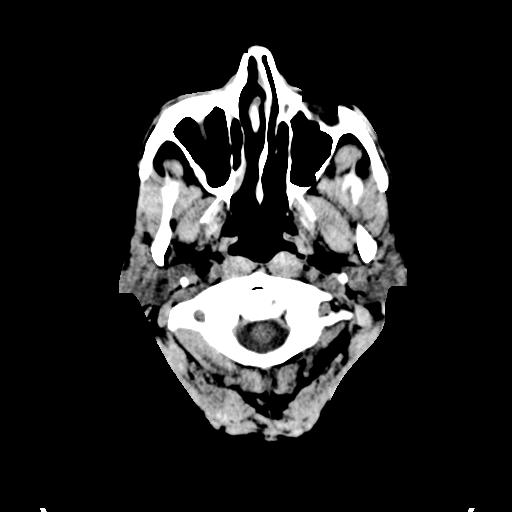
[im 2/42  bone]
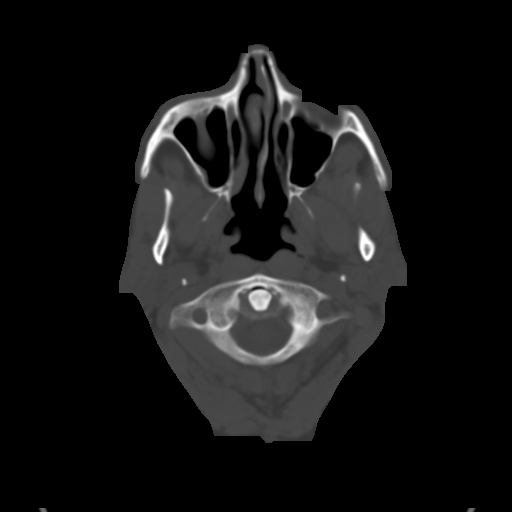
[im 5/42  brain]
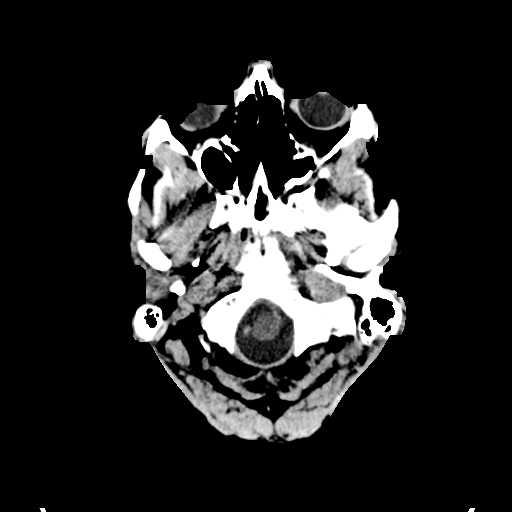
[im 8/42  brain]
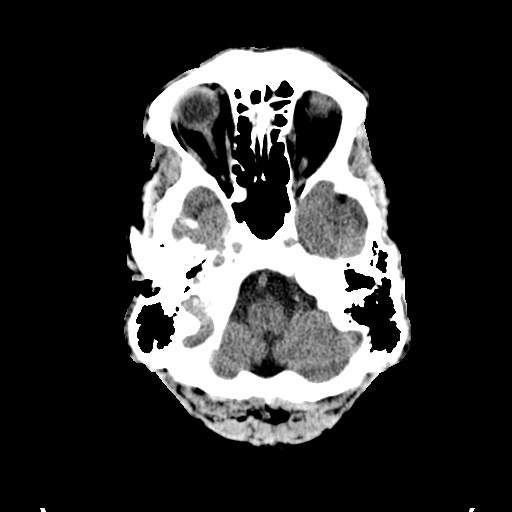
[im 10/42  brain]
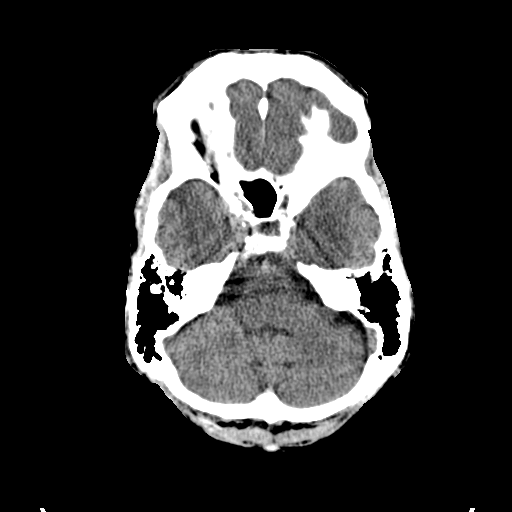
[im 12/42  brain]
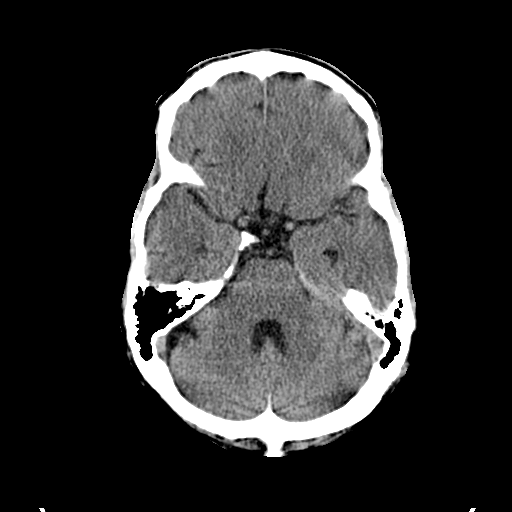
[im 12/42  bone]
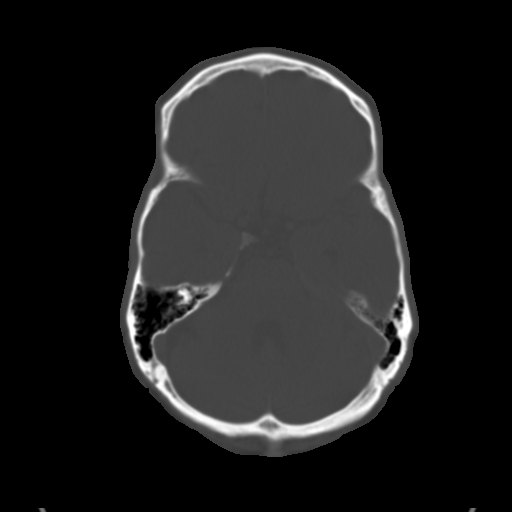
[im 15/42  brain]
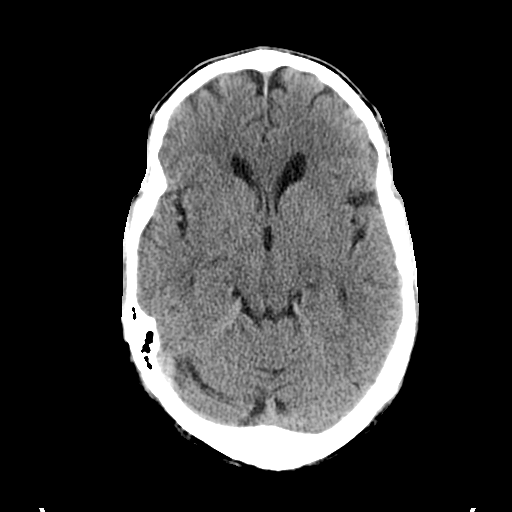
[im 17/42  brain]
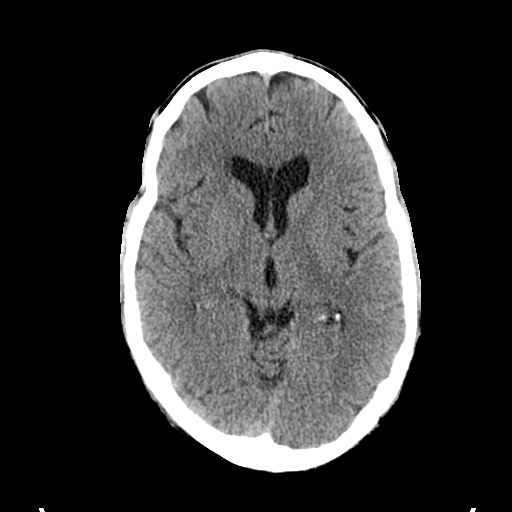
[im 20/42  brain]
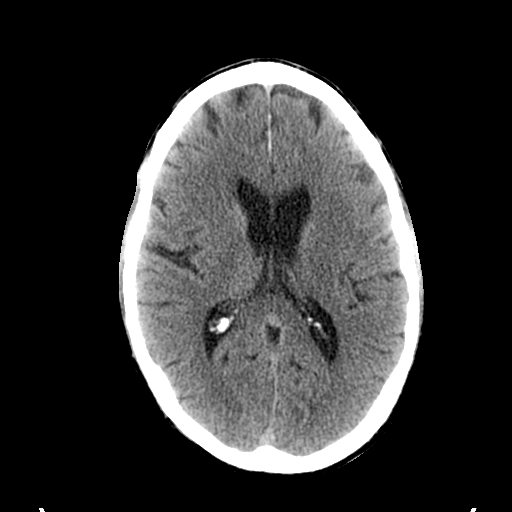
[im 22/42  brain]
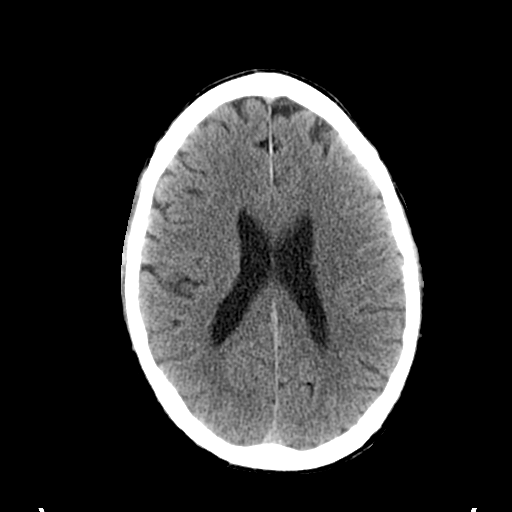
[im 22/42  bone]
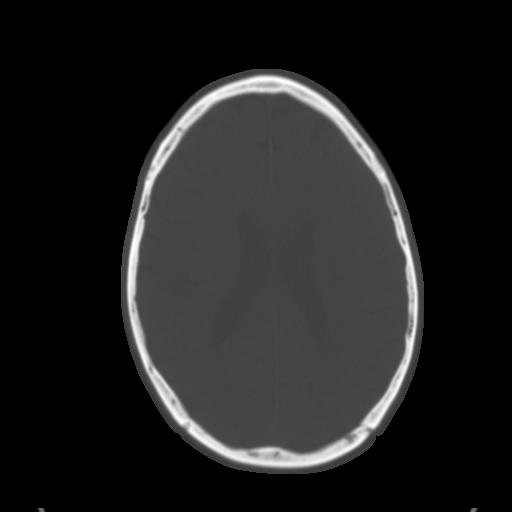
[im 25/42  brain]
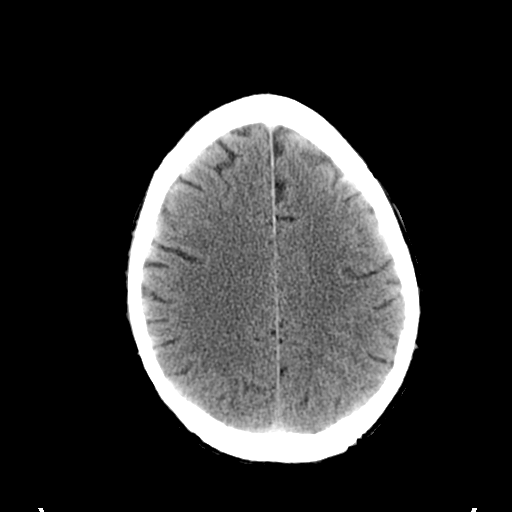
[im 27/42  brain]
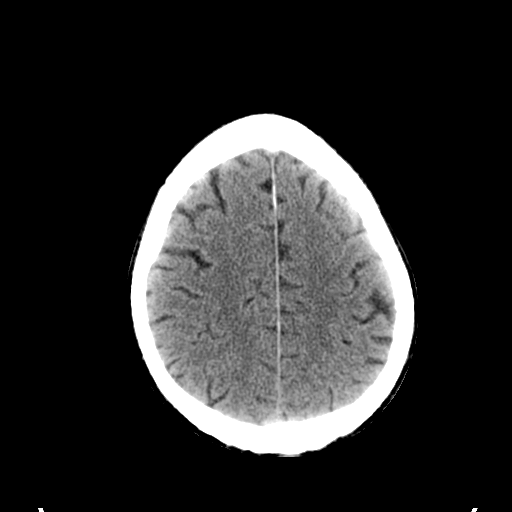
[im 30/42  brain]
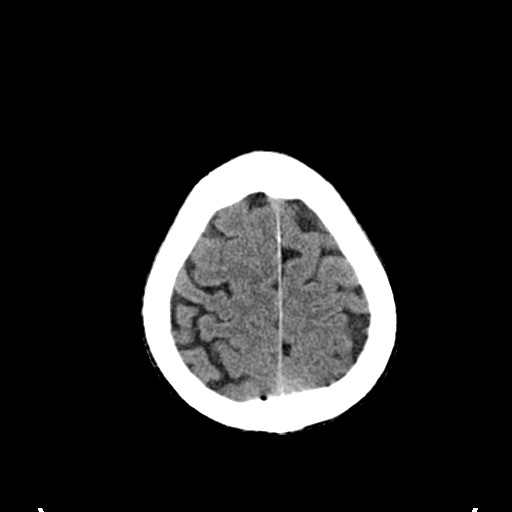
[im 32/42  brain]
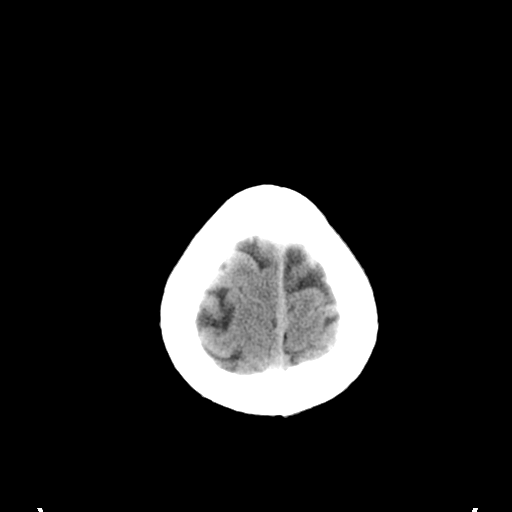
[im 32/42  bone]
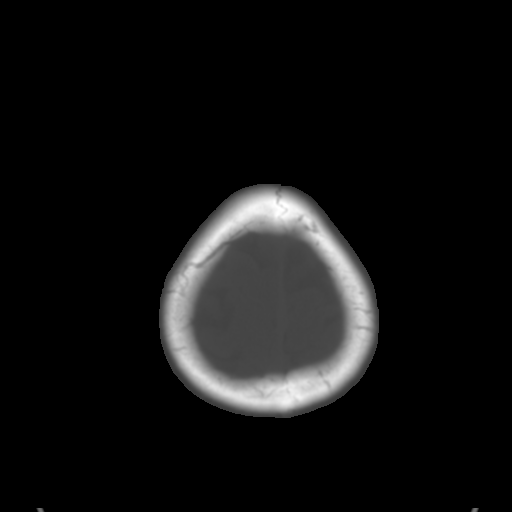
[im 34/42  brain]
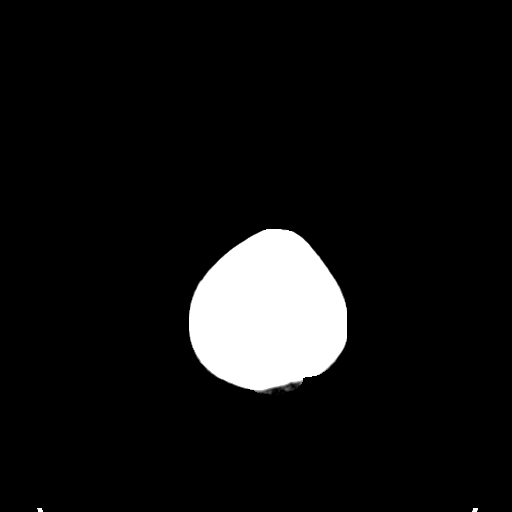
[im 37/42  brain]
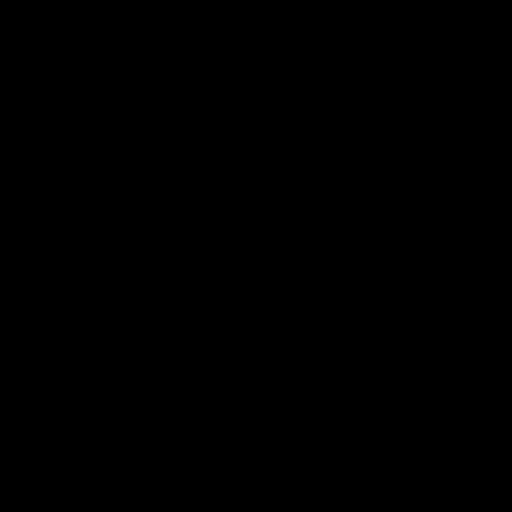
[im 40/42  brain]
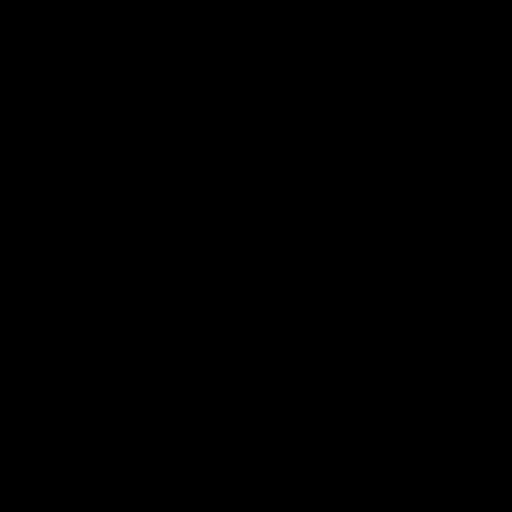

[16 of 30 positions shown; findings below may reference images not displayed]

FINDINGS: No mass lesion, mass effect, midline shift,
hydrocephalus, hemorrhage.  No territorial ischemia or acute
infarction.  Bilateral mucous retention cysts or polyps are present
within the maxillary sinuses.  Bilateral frontal bone benign
osteoma as are present.
IMPRESSION: Negative CT head.

## 2014-06-11 ENCOUNTER — Other Ambulatory Visit (HOSPITAL_COMMUNITY): Payer: Self-pay | Admitting: Family Medicine

## 2014-06-11 DIAGNOSIS — M25561 Pain in right knee: Secondary | ICD-10-CM

## 2014-06-11 DIAGNOSIS — I70219 Atherosclerosis of native arteries of extremities with intermittent claudication, unspecified extremity: Secondary | ICD-10-CM

## 2014-06-12 ENCOUNTER — Ambulatory Visit (HOSPITAL_COMMUNITY)
Admission: RE | Admit: 2014-06-12 | Discharge: 2014-06-12 | Disposition: A | Discharge status: Home / Self Care | Payer: Medicare Other | Source: Ambulatory Visit | Attending: Family Medicine | Admitting: Family Medicine

## 2014-06-12 DIAGNOSIS — I70219 Atherosclerosis of native arteries of extremities with intermittent claudication, unspecified extremity: Secondary | ICD-10-CM

## 2014-06-12 DIAGNOSIS — M79604 Pain in right leg: Secondary | ICD-10-CM | POA: Diagnosis not present

## 2014-06-12 DIAGNOSIS — M79609 Pain in unspecified limb: Secondary | ICD-10-CM

## 2014-06-12 DIAGNOSIS — M25561 Pain in right knee: Secondary | ICD-10-CM

## 2014-07-18 ENCOUNTER — Other Ambulatory Visit: Payer: Self-pay

## 2014-07-18 MED ORDER — DABIGATRAN ETEXILATE MESYLATE 150 MG PO CAPS: 150.0000 mg | ORAL_CAPSULE | Freq: Two times a day (BID) | ORAL | Status: DC

## 2015-01-01 ENCOUNTER — Encounter (INDEPENDENT_AMBULATORY_CARE_PROVIDER_SITE_OTHER): Payer: Medicare Other | Admitting: Ophthalmology

## 2015-01-01 DIAGNOSIS — E11329 Type 2 diabetes mellitus with mild nonproliferative diabetic retinopathy without macular edema: Secondary | ICD-10-CM | POA: Diagnosis not present

## 2015-01-01 DIAGNOSIS — I10 Essential (primary) hypertension: Secondary | ICD-10-CM | POA: Diagnosis not present

## 2015-01-01 DIAGNOSIS — H43813 Vitreous degeneration, bilateral: Secondary | ICD-10-CM | POA: Diagnosis not present

## 2015-01-01 DIAGNOSIS — H2513 Age-related nuclear cataract, bilateral: Secondary | ICD-10-CM

## 2015-01-01 DIAGNOSIS — H3531 Nonexudative age-related macular degeneration: Secondary | ICD-10-CM

## 2015-01-01 DIAGNOSIS — E11319 Type 2 diabetes mellitus with unspecified diabetic retinopathy without macular edema: Secondary | ICD-10-CM | POA: Diagnosis not present

## 2015-01-01 DIAGNOSIS — D3131 Benign neoplasm of right choroid: Secondary | ICD-10-CM

## 2015-01-01 DIAGNOSIS — H35033 Hypertensive retinopathy, bilateral: Secondary | ICD-10-CM

## 2015-02-07 ENCOUNTER — Other Ambulatory Visit: Payer: Self-pay | Admitting: Family Medicine

## 2015-02-07 DIAGNOSIS — M545 Low back pain: Secondary | ICD-10-CM

## 2015-02-15 ENCOUNTER — Ambulatory Visit
Admission: RE | Admit: 2015-02-15 | Discharge: 2015-02-15 | Disposition: A | Discharge status: Home / Self Care | Payer: Medicare Other | Source: Ambulatory Visit | Attending: Family Medicine | Admitting: Family Medicine

## 2015-02-15 DIAGNOSIS — M545 Low back pain: Secondary | ICD-10-CM

## 2015-02-19 ENCOUNTER — Other Ambulatory Visit: Payer: Self-pay | Admitting: Nurse Practitioner

## 2015-02-19 NOTE — Telephone Encounter (Signed)
Patient has appt scheduled

## 2015-04-08 ENCOUNTER — Ambulatory Visit (INDEPENDENT_AMBULATORY_CARE_PROVIDER_SITE_OTHER): Payer: Medicare Other | Admitting: Neurology

## 2015-04-08 ENCOUNTER — Encounter: Payer: Self-pay | Admitting: Neurology

## 2015-04-08 VITALS — BP 154/81 | HR 43 | Ht 70.0 in | Wt 173.4 lb

## 2015-04-08 DIAGNOSIS — I6529 Occlusion and stenosis of unspecified carotid artery: Secondary | ICD-10-CM | POA: Diagnosis not present

## 2015-04-08 NOTE — Patient Instructions (Signed)
I had a long d/w patient about his remote TIA, risk for recurrent stroke/TIAs, personally independently reviewed imaging studies and stroke evaluation results and answered questions.Continue [name]}  for secondary stroke prevention and maintain strict control of hypertension with blood pressure goal below 130/90, diabetes with hemoglobin A1c goal below 6.5% and lipids with LDL cholesterol goal below 100 mg/dL. Marland Kitchencheck follow-up carotid ultrasound study Followup in the future with me in  1 year or call earlier if necessary.
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
## 2015-04-18 ENCOUNTER — Ambulatory Visit (INDEPENDENT_AMBULATORY_CARE_PROVIDER_SITE_OTHER): Payer: Medicare Other

## 2015-04-18 ENCOUNTER — Other Ambulatory Visit: Payer: Self-pay

## 2015-04-18 DIAGNOSIS — I6529 Occlusion and stenosis of unspecified carotid artery: Secondary | ICD-10-CM | POA: Diagnosis not present

## 2015-04-18 MED ORDER — DABIGATRAN ETEXILATE MESYLATE 150 MG PO CAPS: ORAL_CAPSULE | ORAL | Status: DC

## 2015-06-10 ENCOUNTER — Telehealth: Payer: Self-pay

## 2015-06-10 NOTE — Telephone Encounter (Signed)
Spoke to spouse. Gave carotid ultrasound results. Spouse verbalized understanding.

## 2015-06-10 NOTE — Telephone Encounter (Signed)
-----   Message from Garvin Fila, MD sent at 05/27/2015  6:01 PM EDT ----- Darryl Navarro inform the patient that carotid ultrasound study done in our office shows no major blockage to worry about. Minor thickening of the carotid arteries which is an age appropriate finding

## 2015-06-25 ENCOUNTER — Other Ambulatory Visit: Payer: Self-pay | Admitting: Neurosurgery

## 2015-06-25 DIAGNOSIS — M48061 Spinal stenosis, lumbar region without neurogenic claudication: Secondary | ICD-10-CM

## 2015-06-27 ENCOUNTER — Telehealth: Payer: Self-pay

## 2015-06-27 NOTE — Telephone Encounter (Signed)
Rn call [place] at [phone].Rn explain to Malone that Dr.Sethi will not be back in the office till October 25. Rn explain the form cannot be filled out until he returns to the office.Rn stated to [place] if the patient was having the procedure next week. Chrys Racer explain to Rn that they have to get approval to stop the [name] for him to have the lumbar myelogram. Chrys Racer stated she understands and the form can be sign and return on Tuesday.

## 2015-07-02 NOTE — Telephone Encounter (Signed)
Form fax and sign by Dr. Leonie Man to [place].

## 2015-07-03 NOTE — Telephone Encounter (Signed)
Error

## 2015-07-09 ENCOUNTER — Ambulatory Visit
Admission: RE | Admit: 2015-07-09 | Discharge: 2015-07-09 | Disposition: A | Discharge status: Home / Self Care | Payer: Medicare Other | Source: Ambulatory Visit | Attending: Neurosurgery | Admitting: Neurosurgery

## 2015-07-09 DIAGNOSIS — M48061 Spinal stenosis, lumbar region without neurogenic claudication: Secondary | ICD-10-CM

## 2015-07-09 MED ORDER — IOHEXOL 180 MG/ML  SOLN
15.0000 mL | Freq: Once | INTRAMUSCULAR | Status: DC | PRN
  Administered 2015-07-09: 15 mL via INTRATHECAL

## 2015-07-09 NOTE — Discharge Instructions (Addendum)
Myelogram Discharge Instructions  1. Go home and rest quietly for the next 24 hours.  It is important to lie flat for the next 24 hours.  Get up only to go to the restroom.  You may lie in the bed or on a couch on your back, your stomach, your left side or your right side.  You may have one pillow under your head.  You may have pillows between your knees while you are on your side or under your knees while you are on your back.  2. DO NOT drive today.  Recline the seat as far back as it will go, while still wearing your seat belt, on the way home.  3. You may get up to go to the bathroom as needed.  You may sit up for 10 minutes to eat.  You may resume your normal diet and medications unless otherwise indicated.  Drink lots of extra fluids today and tomorrow.  4. The incidence of headache, nausea, or vomiting is about 5% (one in 20 patients).  If you develop a headache, lie flat and drink plenty of fluids until the headache goes away.  Caffeinated beverages may be helpful.  If you develop severe nausea and vomiting or a headache that does not go away with flat bed rest, call [phone].  5. You may resume normal activities after your 24 hours of bed rest is over; however, do not exert yourself strongly or do any heavy lifting tomorrow. If when you get up you have a headache when standing, go back to bed and force fluids for another 24 hours.  6. Call your physician for a follow-up appointment.  The results of your myelogram will be sent directly to your physician by the following day.  7. If you have any questions or if complications develop after you arrive home, please call [phone].  Discharge instructions have been explained to the patient.  The patient, or the person responsible for the patient, fully understands these instructions.       May resume [name] today.

## 2015-07-09 NOTE — Progress Notes (Signed)
Pt states he has been off [name] for the past 2 days.  Discharge instructions explained to pt.  Pt choose not to take valium.

## 2015-07-12 ENCOUNTER — Other Ambulatory Visit (HOSPITAL_COMMUNITY): Payer: Self-pay | Admitting: Neurosurgery

## 2015-07-29 ENCOUNTER — Other Ambulatory Visit (HOSPITAL_COMMUNITY): Payer: Medicare Other

## 2015-08-28 NOTE — Pre-Procedure Instructions (Signed)
Darryl Navarro  08/28/2015      [facility] - [place] - [address] [place] [address] [place] [address] Phone: [phone] Fax: [phone]    Your procedure is scheduled on Tuesday, January 3rd, 2016.  Report to [facility] Admitting at 5:30 A.M.  Call this number if you have problems the morning of surgery:  [phone]   Remember:  Do not eat food or drink liquids after midnight.   Take these medicines the morning of surgery with A SIP OF WATER: Amlodipine (Norvasc), Metoprolol (Lopressor).   7 days prior to surgery, stop taking: Dabigatran ([name]), Ibuprofen (Advil, Motrin), Aspirin, NSAIDS, Aleve, Naproxen, BC's, Goody's, Fish oil, all herbal medications, and all vitamins.    What do I do about my diabetes medications?   Do not take oral diabetes medicines (pills) the morning of surgery.     Do not wear jewelry.  Do not wear lotions, powders, or colgoness.  You may NOT wear deodorant.  Men may shave face and neck.  Do not bring valuables to the hospital.  [facility] is not responsible for any belongings or valuables.  Contacts, dentures or bridgework may not be worn into surgery.  Leave your suitcase in the car.  After surgery it may be brought to your room.  For patients admitted to the hospital, discharge time will be determined by your treatment team.  Patients discharged the day of surgery will not be allowed to drive home.   Special instructions:  See attached.   Please read over the following fact sheets that you were given. Pain Booklet, Coughing and Deep Breathing, Blood Transfusion Information, MRSA Information and Surgical Site Infection Prevention    How to Manage Your Diabetes Before Surgery   Why is it important to control my blood sugar before and after surgery?   Improving blood sugar levels before and after surgery helps healing and can limit problems.  A way of  improving blood sugar control is eating a healthy diet by:  - Eating less sugar and carbohydrates  - Increasing activity/exercise  - Talk with your doctor about reaching your blood sugar goals  High blood sugars (greater than 180 mg/dL) can raise your risk of infections and slow down your recovery so you will need to focus on controlling your diabetes during the weeks before surgery.  Make sure that the doctor who takes care of your diabetes knows about your planned surgery including the date and location.  How do I manage my blood sugars before surgery?   Check your blood sugar at least 4 times a day, 2 days before surgery to make sure that they are not too high or low.   Check your blood sugar the morning of your surgery when you wake up and every 2 hours until you get to the Short-Stay unit.  If your blood sugar is less than 70 mg/dL, you will need to treat for low blood sugar by:  Treat a low blood sugar (less than 70 mg/dL) with 1/2 cup of clear juice (cranberry or apple), 4 glucose tablets, OR glucose gel.  Recheck blood sugar in 15 minutes after treatment (to make sure it is greater than 70 mg/dL).  If blood sugar is not greater than 70 mg/dL on re-check, call [phone] for further instructions.   Report your blood sugar to the Short-Stay nurse when you get to Short-Stay.  References:  [facility],  2007 "How to Manage your Diabetes Before and After Surgery".

## 2015-08-29 ENCOUNTER — Encounter (HOSPITAL_COMMUNITY)
Admission: RE | Admit: 2015-08-29 | Discharge: 2015-08-29 | Disposition: A | Discharge status: Home / Self Care | Payer: Medicare Other | Source: Ambulatory Visit | Attending: Neurosurgery | Admitting: Neurosurgery

## 2015-08-29 ENCOUNTER — Encounter (HOSPITAL_COMMUNITY): Payer: Self-pay

## 2015-08-29 DIAGNOSIS — Z0183 Encounter for blood typing: Secondary | ICD-10-CM | POA: Diagnosis not present

## 2015-08-29 DIAGNOSIS — E1140 Type 2 diabetes mellitus with diabetic neuropathy, unspecified: Secondary | ICD-10-CM | POA: Insufficient documentation

## 2015-08-29 DIAGNOSIS — Z7984 Long term (current) use of oral hypoglycemic drugs: Secondary | ICD-10-CM | POA: Insufficient documentation

## 2015-08-29 DIAGNOSIS — Z8673 Personal history of transient ischemic attack (TIA), and cerebral infarction without residual deficits: Secondary | ICD-10-CM | POA: Insufficient documentation

## 2015-08-29 DIAGNOSIS — Z85828 Personal history of other malignant neoplasm of skin: Secondary | ICD-10-CM | POA: Diagnosis not present

## 2015-08-29 DIAGNOSIS — M4806 Spinal stenosis, lumbar region: Secondary | ICD-10-CM | POA: Diagnosis not present

## 2015-08-29 DIAGNOSIS — Z87891 Personal history of nicotine dependence: Secondary | ICD-10-CM | POA: Diagnosis not present

## 2015-08-29 DIAGNOSIS — Z79899 Other long term (current) drug therapy: Secondary | ICD-10-CM | POA: Diagnosis not present

## 2015-08-29 DIAGNOSIS — R001 Bradycardia, unspecified: Secondary | ICD-10-CM | POA: Diagnosis not present

## 2015-08-29 DIAGNOSIS — I480 Paroxysmal atrial fibrillation: Secondary | ICD-10-CM | POA: Insufficient documentation

## 2015-08-29 DIAGNOSIS — Z01818 Encounter for other preprocedural examination: Secondary | ICD-10-CM | POA: Insufficient documentation

## 2015-08-29 DIAGNOSIS — I10 Essential (primary) hypertension: Secondary | ICD-10-CM | POA: Diagnosis not present

## 2015-08-29 DIAGNOSIS — Z7902 Long term (current) use of antithrombotics/antiplatelets: Secondary | ICD-10-CM | POA: Diagnosis not present

## 2015-08-29 DIAGNOSIS — Z01812 Encounter for preprocedural laboratory examination: Secondary | ICD-10-CM | POA: Insufficient documentation

## 2015-08-29 HISTORY — DX: Essential (primary) hypertension: I10

## 2015-08-29 HISTORY — DX: Anesthesia of skin: R20.2

## 2015-08-29 HISTORY — DX: Unspecified osteoarthritis, unspecified site: M19.90

## 2015-08-29 HISTORY — DX: Anesthesia of skin: R20.0

## 2015-08-29 LAB — CBC
HCT: 38.7 % — ABNORMAL LOW (ref 39.0–52.0)
Hemoglobin: 12.6 g/dL — ABNORMAL LOW (ref 13.0–17.0)
MCH: 29.9 pg (ref 26.0–34.0)
MCHC: 32.6 g/dL (ref 30.0–36.0)
MCV: 91.7 fL (ref 78.0–100.0)
PLATELETS: 159 10*3/uL (ref 150–400)
RBC: 4.22 MIL/uL (ref 4.22–5.81)
RDW: 13.5 % (ref 11.5–15.5)
WBC: 6 10*3/uL (ref 4.0–10.5)

## 2015-08-29 LAB — BASIC METABOLIC PANEL
Anion gap: 9 (ref 5–15)
BUN: 16 mg/dL (ref 6–20)
CALCIUM: 9.6 mg/dL (ref 8.9–10.3)
CO2: 25 mmol/L (ref 22–32)
Chloride: 107 mmol/L (ref 101–111)
Creatinine, Ser: 1.16 mg/dL (ref 0.61–1.24)
GFR calc Af Amer: >60 mL/min (ref >60)
GLUCOSE: 139 mg/dL — AB (ref 65–99)
Potassium: 4.5 mmol/L (ref 3.5–5.1)
SODIUM: 141 mmol/L (ref 135–145)

## 2015-08-29 LAB — TYPE AND SCREEN
ABO/RH(D): O NEG
ANTIBODY SCREEN: NEGATIVE

## 2015-08-29 LAB — ABO/RH: ABO/RH(D): O NEG

## 2015-08-29 LAB — GLUCOSE, CAPILLARY: Glucose-Capillary: 169 mg/dL — ABNORMAL HIGH (ref 65–99)

## 2015-08-29 LAB — SURGICAL PCR SCREEN
MRSA, PCR: NEGATIVE
STAPHYLOCOCCUS AUREUS: POSITIVE — AB

## 2015-08-29 NOTE — Progress Notes (Signed)
Patient notified of PCR swab but patient does not have a pharmacy at this time.  He is moving with his wife to [place] today and will not know what pharmacy he will be using until he arrives this evening.  Patient was instructed to call short stay nurse with a pharmacy, so nurse can call in the prescription.  Patient verbalizes understanding and states that he will call this afternoon or on 12/23.

## 2015-08-29 NOTE — Progress Notes (Signed)
PCP - Dr. Leonard Downing Cardiologist - denies  Patient sees Dr. Leonie Man for A. Fib.  EKG - 08/29/15 CXR - denies  Echo - 2013 - Epic Stress test- 2013 - Epic Cardiac Cath - denies  Patient denies chest pain and shortness of breath at PAT appointment.    Patient states that he was instructed to stop his [name] 5 days prior to procedure.    Patient states that he checks his blood sugar occasionally and that his fasting blood sugar is in low 100's.

## 2015-08-30 LAB — HEMOGLOBIN A1C
Hgb A1c MFr Bld: 6.2 % — ABNORMAL HIGH (ref 4.8–5.6)
MEAN PLASMA GLUCOSE: 131 mg/dL

## 2015-08-30 LAB — GLUCOSE, CAPILLARY: Glucose-Capillary: 122 mg/dL — ABNORMAL HIGH (ref 65–99)

## 2015-08-30 NOTE — Progress Notes (Signed)
Mupirocin prescription called into [facility], [phone].    Patient notified that script was called in.  Patient can be reached at [phone] if needed.

## 2015-08-30 NOTE — Progress Notes (Signed)
Anesthesia Chart Review: Patient is a 69 year old male scheduled for right L2-3, L3-4 anterior lateral lumbar fusion on 09/10/15 by Dr. Hal Neer.  History includes former smoker, paroxysmal atrial fibrillation with embolic CVA (see no deficits) 03/2012 (on [name]), diabetes mellitus type 2, neuropathy, hypertension, skin cancer (BCC), arthritis. He was evaluated by cardiologist Dr. Stanford Breed in 2013 for PAF with evidence of cardiomyopathy by echo (EF 40-45%). NM stress test ordered and was non-ischemia with EF 53%. He has not seen Dr. Stanford Breed since, and reports PAF is being followed by neurologist Dr. Leonie Man, last visit 04/08/15. PCP is Dr. Claris Gower. Patient is in the process of moving to [place].  PAT Vitals: BP 134/68, HR 49, RR 20, T 37.1C, O2 sat 98%. CBG 169. BMI 25.  Meds include amlodipine, atorvastatin, [name], glimepiride, lisinopril, metformin, metoprolol. He reports being instructed to hold [name] for 5 days prior to surgery.   08/29/15 EKG: SB at 49 bpm, cannot rule out anterior infarct (age undetermined). Overall, I think his EKG appears stable when compared to 05/03/12 tracing. He denied CP and SOB at PAT.   05/18/12 Nuclear stress test: Overall Impression: Normal stress nuclear study. LV Ejection Fraction: 53%. LV Wall Motion: LV function is low normal.  04/01/12 Echo: Study Conclusions - Left ventricle: The cavity size was normal. Wall thickness was increased in a pattern of moderate LVH. Systolic function was mildly to moderately reduced. The estimated ejection fraction was in the range of 40% to 45%. Diffuse hypokinesis. - Left atrium: The atrium was mildly dilated. - Right ventricle: The cavity size was mildly dilated. - Right atrium: The atrium was mildly dilated.  04/18/15 Carotid duplex: Negative for hemodynamically significant stenosis involving extracranial carotid arteries bilaterally. Homogeneous plaque was seen in the left bulb as well as calcific focal  plaque with acoustic shadowing the right carotid bulb. There was normal antegrade flow seen in the bilateral vertebral arteries.  Preoperative labs noted. A1C 6.2.   Patient maintaining SR (bradycardia). He had a normal stress test in 05/2012, EF 53% improved from 40-45% by echo 2 months prior. He denied CV symptoms at PAT. Medications include ACEI and b-blocker therapy. DM is well controlled. If no acute changes then I would anticipate that he could proceed as planned. Anesthesiologist Dr. Linna Caprice agrees with this plan.  George Hugh [facility] Short Stay Center/Anesthesiology Phone [phone] 08/30/2015 2:27 PM

## 2015-09-08 HISTORY — PX: BACK SURGERY: SHX140

## 2015-09-09 MED ORDER — DEXAMETHASONE SODIUM PHOSPHATE 10 MG/ML IJ SOLN
10.0000 mg | INTRAMUSCULAR | Status: AC
Start: 2015-09-10 — End: 2015-09-10
  Administered 2015-09-10: 10 mg via INTRAVENOUS
  Filled 2015-09-09: qty 1

## 2015-09-09 MED ORDER — CEFAZOLIN SODIUM-DEXTROSE 2-3 GM-% IV SOLR
2.0000 g | INTRAVENOUS | Status: AC
  Administered 2015-09-10: 2 g via INTRAVENOUS
  Filled 2015-09-09: qty 50

## 2015-09-10 ENCOUNTER — Inpatient Hospital Stay (HOSPITAL_COMMUNITY): Payer: Medicare Other

## 2015-09-10 ENCOUNTER — Inpatient Hospital Stay (HOSPITAL_COMMUNITY)
Admission: RE | Admit: 2015-09-10 | Discharge: 2015-09-15 | DRG: 458 | Disposition: A | Discharge status: Home / Self Care | Payer: Medicare Other | Source: Ambulatory Visit | Attending: Neurosurgery | Admitting: Neurosurgery

## 2015-09-10 ENCOUNTER — Inpatient Hospital Stay (HOSPITAL_COMMUNITY): Payer: Medicare Other | Admitting: Anesthesiology

## 2015-09-10 ENCOUNTER — Encounter (HOSPITAL_COMMUNITY)
Admission: RE | Disposition: A | Discharge status: Home / Self Care | Payer: Self-pay | Source: Ambulatory Visit | Attending: Neurosurgery

## 2015-09-10 ENCOUNTER — Encounter (HOSPITAL_COMMUNITY): Payer: Self-pay

## 2015-09-10 ENCOUNTER — Inpatient Hospital Stay (HOSPITAL_COMMUNITY): Payer: Medicare Other | Admitting: Vascular Surgery

## 2015-09-10 DIAGNOSIS — M4806 Spinal stenosis, lumbar region: Secondary | ICD-10-CM | POA: Diagnosis present

## 2015-09-10 DIAGNOSIS — M419 Scoliosis, unspecified: Principal | ICD-10-CM | POA: Diagnosis present

## 2015-09-10 DIAGNOSIS — Z8673 Personal history of transient ischemic attack (TIA), and cerebral infarction without residual deficits: Secondary | ICD-10-CM | POA: Diagnosis not present

## 2015-09-10 DIAGNOSIS — I10 Essential (primary) hypertension: Secondary | ICD-10-CM | POA: Diagnosis present

## 2015-09-10 DIAGNOSIS — Z79899 Other long term (current) drug therapy: Secondary | ICD-10-CM | POA: Diagnosis not present

## 2015-09-10 DIAGNOSIS — M4316 Spondylolisthesis, lumbar region: Secondary | ICD-10-CM | POA: Diagnosis present

## 2015-09-10 DIAGNOSIS — M48062 Spinal stenosis, lumbar region with neurogenic claudication: Secondary | ICD-10-CM | POA: Diagnosis present

## 2015-09-10 DIAGNOSIS — M1990 Unspecified osteoarthritis, unspecified site: Secondary | ICD-10-CM | POA: Diagnosis present

## 2015-09-10 DIAGNOSIS — E1140 Type 2 diabetes mellitus with diabetic neuropathy, unspecified: Secondary | ICD-10-CM | POA: Diagnosis present

## 2015-09-10 DIAGNOSIS — Z419 Encounter for procedure for purposes other than remedying health state, unspecified: Secondary | ICD-10-CM

## 2015-09-10 DIAGNOSIS — Z87891 Personal history of nicotine dependence: Secondary | ICD-10-CM

## 2015-09-10 DIAGNOSIS — Z7901 Long term (current) use of anticoagulants: Secondary | ICD-10-CM

## 2015-09-10 DIAGNOSIS — I480 Paroxysmal atrial fibrillation: Secondary | ICD-10-CM | POA: Diagnosis present

## 2015-09-10 DIAGNOSIS — Z7984 Long term (current) use of oral hypoglycemic drugs: Secondary | ICD-10-CM

## 2015-09-10 HISTORY — PX: ANTERIOR LAT LUMBAR FUSION: SHX1168

## 2015-09-10 HISTORY — DX: Spinal stenosis, lumbar region with neurogenic claudication: M48.062

## 2015-09-10 LAB — APTT: APTT: 31 s (ref 24–37)

## 2015-09-10 LAB — GLUCOSE, CAPILLARY
GLUCOSE-CAPILLARY: 165 mg/dL — AB (ref 65–99)
GLUCOSE-CAPILLARY: 170 mg/dL — AB (ref 65–99)
GLUCOSE-CAPILLARY: 210 mg/dL — AB (ref 65–99)
Glucose-Capillary: 137 mg/dL — ABNORMAL HIGH (ref 65–99)
Glucose-Capillary: 179 mg/dL — ABNORMAL HIGH (ref 65–99)

## 2015-09-10 SURGERY — ANTERIOR LATERAL LUMBAR FUSION 2 LEVELS
Anesthesia: General | Laterality: Right

## 2015-09-10 MED ORDER — PROPOFOL 10 MG/ML IV BOLUS
INTRAVENOUS | Status: AC
  Filled 2015-09-10: qty 20

## 2015-09-10 MED ORDER — OXYCODONE HCL 5 MG/5ML PO SOLN: 5.0000 mg | Freq: Once | ORAL | Status: DC | PRN

## 2015-09-10 MED ORDER — POTASSIUM CHLORIDE IN NACL 20-0.45 MEQ/L-% IV SOLN
INTRAVENOUS | Status: DC
  Administered 2015-09-10: 14:00:00 via INTRAVENOUS
  Filled 2015-09-10 (×5): qty 1000

## 2015-09-10 MED ORDER — SODIUM CHLORIDE 0.9 % IJ SOLN: 3.0000 mL | INTRAMUSCULAR | Status: DC | PRN

## 2015-09-10 MED ORDER — INSULIN ASPART 100 UNIT/ML ~~LOC~~ SOLN
0.0000 [IU] | Freq: Three times a day (TID) | SUBCUTANEOUS | Status: DC
  Administered 2015-09-10: 3 [IU] via SUBCUTANEOUS

## 2015-09-10 MED ORDER — HALOPERIDOL LACTATE 5 MG/ML IJ SOLN
2.0000 mg | Freq: Once | INTRAMUSCULAR | Status: AC
  Administered 2015-09-10: 2 mg via INTRAVENOUS
  Filled 2015-09-10: qty 0.4

## 2015-09-10 MED ORDER — SUCCINYLCHOLINE CHLORIDE 20 MG/ML IJ SOLN
INTRAMUSCULAR | Status: DC | PRN
  Administered 2015-09-10: 80 mg via INTRAVENOUS

## 2015-09-10 MED ORDER — BUPIVACAINE HCL (PF) 0.25 % IJ SOLN
INTRAMUSCULAR | Status: DC | PRN
  Administered 2015-09-10: 20 mL

## 2015-09-10 MED ORDER — LACTATED RINGERS IV SOLN
INTRAVENOUS | Status: DC | PRN
  Administered 2015-09-10 (×2): via INTRAVENOUS

## 2015-09-10 MED ORDER — GLYCOPYRROLATE 0.2 MG/ML IJ SOLN
INTRAMUSCULAR | Status: DC | PRN
  Administered 2015-09-10 (×2): 0.2 mg via INTRAVENOUS

## 2015-09-10 MED ORDER — MIDAZOLAM HCL 2 MG/2ML IJ SOLN
INTRAMUSCULAR | Status: AC
  Filled 2015-09-10: qty 2

## 2015-09-10 MED ORDER — EPHEDRINE SULFATE 50 MG/ML IJ SOLN
INTRAMUSCULAR | Status: DC | PRN
  Administered 2015-09-10 (×3): 10 mg via INTRAVENOUS

## 2015-09-10 MED ORDER — CYCLOBENZAPRINE HCL 10 MG PO TABS: 10.0000 mg | ORAL_TABLET | Freq: Three times a day (TID) | ORAL | Status: DC | PRN

## 2015-09-10 MED ORDER — LIDOCAINE HCL (CARDIAC) 20 MG/ML IV SOLN
INTRAVENOUS | Status: AC
  Filled 2015-09-10: qty 5

## 2015-09-10 MED ORDER — METFORMIN HCL 500 MG PO TABS
1000.0000 mg | ORAL_TABLET | Freq: Two times a day (BID) | ORAL | Status: DC
  Administered 2015-09-10 – 2015-09-11 (×3): 1000 mg via ORAL
  Filled 2015-09-10 (×4): qty 2

## 2015-09-10 MED ORDER — SODIUM CHLORIDE 0.9 % IJ SOLN
3.0000 mL | Freq: Two times a day (BID) | INTRAMUSCULAR | Status: DC
  Administered 2015-09-10 – 2015-09-11 (×3): 3 mL via INTRAVENOUS

## 2015-09-10 MED ORDER — GLIMEPIRIDE 4 MG PO TABS
2.0000 mg | ORAL_TABLET | Freq: Every day | ORAL | Status: DC
  Administered 2015-09-11: 2 mg via ORAL
  Filled 2015-09-10: qty 1

## 2015-09-10 MED ORDER — ACETAMINOPHEN 650 MG RE SUPP: 650.0000 mg | RECTAL | Status: DC | PRN

## 2015-09-10 MED ORDER — LISINOPRIL 20 MG PO TABS
20.0000 mg | ORAL_TABLET | Freq: Every day | ORAL | Status: DC
  Administered 2015-09-11: 20 mg via ORAL
  Filled 2015-09-10: qty 1

## 2015-09-10 MED ORDER — PROPOFOL 10 MG/ML IV BOLUS
INTRAVENOUS | Status: DC | PRN
  Administered 2015-09-10: 120 mg via INTRAVENOUS

## 2015-09-10 MED ORDER — FENTANYL CITRATE (PF) 100 MCG/2ML IJ SOLN
INTRAMUSCULAR | Status: DC | PRN
  Administered 2015-09-10 (×4): 50 ug via INTRAVENOUS

## 2015-09-10 MED ORDER — ZOLPIDEM TARTRATE 5 MG PO TABS: 5.0000 mg | ORAL_TABLET | Freq: Every evening | ORAL | Status: DC | PRN

## 2015-09-10 MED ORDER — ONDANSETRON HCL 4 MG/2ML IJ SOLN: 4.0000 mg | Freq: Once | INTRAMUSCULAR | Status: DC | PRN

## 2015-09-10 MED ORDER — SODIUM CHLORIDE 0.9 % IV SOLN: 250.0000 mL | INTRAVENOUS | Status: DC

## 2015-09-10 MED ORDER — THROMBIN 5000 UNITS EX SOLR
CUTANEOUS | Status: DC | PRN
  Administered 2015-09-10 (×2): 5000 [IU] via TOPICAL

## 2015-09-10 MED ORDER — DOCUSATE SODIUM 100 MG PO CAPS
100.0000 mg | ORAL_CAPSULE | Freq: Two times a day (BID) | ORAL | Status: DC
  Administered 2015-09-11: 100 mg via ORAL
  Filled 2015-09-10 (×2): qty 1

## 2015-09-10 MED ORDER — FENTANYL CITRATE (PF) 100 MCG/2ML IJ SOLN
INTRAMUSCULAR | Status: AC
  Filled 2015-09-10: qty 2

## 2015-09-10 MED ORDER — AMLODIPINE BESYLATE 5 MG PO TABS
5.0000 mg | ORAL_TABLET | Freq: Every day | ORAL | Status: DC
  Administered 2015-09-11: 5 mg via ORAL
  Filled 2015-09-10: qty 1

## 2015-09-10 MED ORDER — ACETAMINOPHEN 325 MG PO TABS: 650.0000 mg | ORAL_TABLET | ORAL | Status: DC | PRN

## 2015-09-10 MED ORDER — INSULIN ASPART 100 UNIT/ML ~~LOC~~ SOLN
4.0000 [IU] | Freq: Three times a day (TID) | SUBCUTANEOUS | Status: DC
  Administered 2015-09-11: 4 [IU] via SUBCUTANEOUS

## 2015-09-10 MED ORDER — SODIUM CHLORIDE 0.9 % IR SOLN
Status: DC | PRN
  Administered 2015-09-10: 08:00:00

## 2015-09-10 MED ORDER — ONDANSETRON HCL 4 MG/2ML IJ SOLN
INTRAMUSCULAR | Status: AC
  Filled 2015-09-10: qty 2

## 2015-09-10 MED ORDER — PHENOL 1.4 % MT LIQD: 1.0000 | OROMUCOSAL | Status: DC | PRN

## 2015-09-10 MED ORDER — METOPROLOL TARTRATE 50 MG PO TABS
50.0000 mg | ORAL_TABLET | Freq: Every day | ORAL | Status: DC
  Administered 2015-09-11: 50 mg via ORAL
  Filled 2015-09-10: qty 1

## 2015-09-10 MED ORDER — MENTHOL 3 MG MT LOZG: 1.0000 | LOZENGE | OROMUCOSAL | Status: DC | PRN

## 2015-09-10 MED ORDER — PROPOFOL 500 MG/50ML IV EMUL
INTRAVENOUS | Status: DC | PRN
  Administered 2015-09-10: 50 ug/kg/min via INTRAVENOUS

## 2015-09-10 MED ORDER — ONDANSETRON HCL 4 MG/2ML IJ SOLN
4.0000 mg | INTRAMUSCULAR | Status: DC | PRN
  Administered 2015-09-12: 4 mg via INTRAVENOUS
  Filled 2015-09-10: qty 2

## 2015-09-10 MED ORDER — INSULIN ASPART 100 UNIT/ML ~~LOC~~ SOLN
0.0000 [IU] | Freq: Every day | SUBCUTANEOUS | Status: DC
  Administered 2015-09-10: 2 [IU] via SUBCUTANEOUS

## 2015-09-10 MED ORDER — SODIUM CHLORIDE 0.9 % IJ SOLN
INTRAMUSCULAR | Status: AC
  Filled 2015-09-10: qty 10

## 2015-09-10 MED ORDER — HYDROMORPHONE HCL 1 MG/ML IJ SOLN: 1.0000 mg | INTRAMUSCULAR | Status: DC | PRN

## 2015-09-10 MED ORDER — BISACODYL 5 MG PO TBEC: 5.0000 mg | DELAYED_RELEASE_TABLET | Freq: Every day | ORAL | Status: DC | PRN

## 2015-09-10 MED ORDER — EPHEDRINE SULFATE 50 MG/ML IJ SOLN
INTRAMUSCULAR | Status: AC
  Filled 2015-09-10: qty 1

## 2015-09-10 MED ORDER — HYDROCODONE-ACETAMINOPHEN 5-325 MG PO TABS
1.0000 | ORAL_TABLET | ORAL | Status: DC | PRN
  Administered 2015-09-10: 1 via ORAL
  Administered 2015-09-11: 2 via ORAL
  Filled 2015-09-10: qty 1
  Filled 2015-09-10: qty 2

## 2015-09-10 MED ORDER — LIDOCAINE HCL (CARDIAC) 20 MG/ML IV SOLN
INTRAVENOUS | Status: DC | PRN
  Administered 2015-09-10: 40 mg via INTRAVENOUS
  Administered 2015-09-10: 60 mg via INTRAVENOUS

## 2015-09-10 MED ORDER — PHENYLEPHRINE HCL 10 MG/ML IJ SOLN
10.0000 mg | INTRAMUSCULAR | Status: DC | PRN
  Administered 2015-09-10: 10 ug/min via INTRAVENOUS

## 2015-09-10 MED ORDER — CEFAZOLIN SODIUM-DEXTROSE 2-3 GM-% IV SOLR
2.0000 g | Freq: Three times a day (TID) | INTRAVENOUS | Status: AC
  Administered 2015-09-10 – 2015-09-11 (×2): 2 g via INTRAVENOUS
  Filled 2015-09-10 (×3): qty 50

## 2015-09-10 MED ORDER — HYDROMORPHONE HCL 1 MG/ML IJ SOLN: 0.2500 mg | INTRAMUSCULAR | Status: DC | PRN

## 2015-09-10 MED ORDER — HEMOSTATIC AGENTS (NO CHARGE) OPTIME
TOPICAL | Status: DC | PRN
  Administered 2015-09-10: 1 via TOPICAL

## 2015-09-10 MED ORDER — 0.9 % SODIUM CHLORIDE (POUR BTL) OPTIME
TOPICAL | Status: DC | PRN
  Administered 2015-09-10: 1000 mL

## 2015-09-10 MED ORDER — ONDANSETRON HCL 4 MG/2ML IJ SOLN
INTRAMUSCULAR | Status: DC | PRN
  Administered 2015-09-10: 4 mg via INTRAVENOUS

## 2015-09-10 MED ORDER — PANTOPRAZOLE SODIUM 40 MG IV SOLR
40.0000 mg | Freq: Every day | INTRAVENOUS | Status: DC
  Filled 2015-09-10: qty 40

## 2015-09-10 MED ORDER — GLYCOPYRROLATE 0.2 MG/ML IJ SOLN
INTRAMUSCULAR | Status: AC
  Filled 2015-09-10: qty 1

## 2015-09-10 MED ORDER — FENTANYL CITRATE (PF) 250 MCG/5ML IJ SOLN
INTRAMUSCULAR | Status: AC
  Filled 2015-09-10: qty 5

## 2015-09-10 MED ORDER — OXYCODONE HCL 5 MG PO TABS: 5.0000 mg | ORAL_TABLET | Freq: Once | ORAL | Status: DC | PRN

## 2015-09-10 SURGICAL SUPPLY — 57 items
BAG DECANTER FOR FLEXI CONT (MISCELLANEOUS) ×2 IMPLANT
BENZOIN TINCTURE PRP APPL 2/3 (GAUZE/BANDAGES/DRESSINGS) ×2 IMPLANT
BLADE CLIPPER SURG (BLADE) IMPLANT
BONE EQUIVA 5CC (Bone Implant) ×2 IMPLANT
BRUSH SCRUB EZ PLAIN DRY (MISCELLANEOUS) ×2 IMPLANT
CAGE TIMBERLINE 18X10X50-0 (Cage) ×2 IMPLANT
COVER BACK TABLE 24X17X13 BIG (DRAPES) IMPLANT
DRAPE C-ARM 42X72 X-RAY (DRAPES) ×2 IMPLANT
DRAPE C-ARMOR (DRAPES) ×2 IMPLANT
DRAPE LAPAROTOMY 100X72X124 (DRAPES) ×2 IMPLANT
DRAPE SURG 17X23 STRL (DRAPES) ×4 IMPLANT
DRSG OPSITE POSTOP 4X6 (GAUZE/BANDAGES/DRESSINGS) ×2 IMPLANT
DURAPREP 26ML APPLICATOR (WOUND CARE) ×2 IMPLANT
ELECT REM PT RETURN 9FT ADLT (ELECTROSURGICAL) ×2
ELECTRODE REM PT RTRN 9FT ADLT (ELECTROSURGICAL) ×1 IMPLANT
FEE INTRAOP MONITOR IMPULS NCS (MISCELLANEOUS) ×1 IMPLANT
GAUZE SPONGE 4X4 12PLY STRL (GAUZE/BANDAGES/DRESSINGS) ×2 IMPLANT
GAUZE SPONGE 4X4 16PLY XRAY LF (GAUZE/BANDAGES/DRESSINGS) IMPLANT
GLOVE ECLIPSE 7.0 STRL STRAW (GLOVE) ×2 IMPLANT
GLOVE ECLIPSE 7.5 STRL STRAW (GLOVE) ×8 IMPLANT
GLOVE ECLIPSE 8.0 STRL XLNG CF (GLOVE) ×2 IMPLANT
GLOVE EXAM NITRILE LRG STRL (GLOVE) IMPLANT
GLOVE EXAM NITRILE XL STR (GLOVE) IMPLANT
GLOVE EXAM NITRILE XS STR PU (GLOVE) IMPLANT
GLOVE INDICATOR 7.5 STRL GRN (GLOVE) ×4 IMPLANT
GLOVE INDICATOR 8.0 STRL GRN (GLOVE) ×4 IMPLANT
GOWN STRL REUS W/ TWL LRG LVL3 (GOWN DISPOSABLE) ×2 IMPLANT
GOWN STRL REUS W/ TWL XL LVL3 (GOWN DISPOSABLE) IMPLANT
GOWN STRL REUS W/TWL 2XL LVL3 (GOWN DISPOSABLE) ×4 IMPLANT
GOWN STRL REUS W/TWL LRG LVL3 (GOWN DISPOSABLE) ×2
GOWN STRL REUS W/TWL XL LVL3 (GOWN DISPOSABLE)
GUIDEWIRES (WIRE) ×4 IMPLANT
INTRAOP MONITOR FEE IMPULS NCS (MISCELLANEOUS) ×1
INTRAOP MONITOR FEE IMPULSE (MISCELLANEOUS) ×1
KIT ACCESS (KITS) ×2 IMPLANT
KIT BASIN OR (CUSTOM PROCEDURE TRAY) ×2 IMPLANT
KIT NEURO (KITS) ×2 IMPLANT
KIT ROOM TURNOVER OR (KITS) ×2 IMPLANT
LIQUID BAND (GAUZE/BANDAGES/DRESSINGS) ×2 IMPLANT
NEEDLE HYPO 22GX1.5 SAFETY (NEEDLE) ×2 IMPLANT
NS IRRIG 1000ML POUR BTL (IV SOLUTION) ×2 IMPLANT
PACK LAMINECTOMY NEURO (CUSTOM PROCEDURE TRAY) ×2 IMPLANT
PUTTY BONE GRAFT KIT 2.5ML (Bone Implant) ×2 IMPLANT
PUTTY DBM 5CC CALC GRAN ×2 IMPLANT
SPACER LAT PEEK 0DEG 50X22X08H (Spacer) ×2 IMPLANT
SPONGE LAP 4X18 X RAY DECT (DISPOSABLE) IMPLANT
SPONGE SURGIFOAM ABS GEL SZ50 (HEMOSTASIS) ×2 IMPLANT
STRIP CLOSURE SKIN 1/2X4 (GAUZE/BANDAGES/DRESSINGS) ×2 IMPLANT
SUT VIC AB 2-0 OS6 18 (SUTURE) ×6 IMPLANT
SUT VIC AB 3-0 CP2 18 (SUTURE) ×4 IMPLANT
TAPE CLOTH 3X10 TAN LF (GAUZE/BANDAGES/DRESSINGS) ×4 IMPLANT
TOWEL OR 17X24 6PK STRL BLUE (TOWEL DISPOSABLE) ×2 IMPLANT
TOWEL OR 17X26 10 PK STRL BLUE (TOWEL DISPOSABLE) ×2 IMPLANT
TRAP SPECIMEN MUCOUS 40CC (MISCELLANEOUS) IMPLANT
TRAY FOLEY W/METER SILVER 14FR (SET/KITS/TRAYS/PACK) IMPLANT
TRAY FOLEY W/METER SILVER 16FR (SET/KITS/TRAYS/PACK) ×2 IMPLANT
WATER STERILE IRR 1000ML POUR (IV SOLUTION) ×2 IMPLANT

## 2015-09-10 NOTE — Anesthesia Preprocedure Evaluation (Addendum)
Anesthesia Evaluation  Patient identified by MRN, date of birth, ID band Patient awake    Reviewed: Allergy & Precautions, H&P , NPO status , Patient's Chart, lab work & pertinent test results, reviewed documented beta blocker date and time   History of Anesthesia Complications Negative for: history of anesthetic complications  Airway Mallampati: II  TM Distance: >3 FB Neck ROM: full    Dental no notable dental hx. (+) Chipped,    Pulmonary former smoker,    Pulmonary exam normal breath sounds clear to auscultation       Cardiovascular hypertension, Pt. on medications Normal cardiovascular exam Rhythm:regular Rate:Normal  Normal nuclear stress test in 2014 with EF preserved at >50%, has hx of paroxysmal afib but is currently sinus brady and is medication compliant and has taken BB today  Denies any signs of active cardiac symptoms   Neuro/Psych CVA    GI/Hepatic negative GI ROS, Neg liver ROS,   Endo/Other  diabetes  Renal/GU negative Renal ROS     Musculoskeletal  (+) Arthritis ,   Abdominal   Peds  Hematology negative hematology ROS (+)   Anesthesia Other Findings   Reproductive/Obstetrics negative OB ROS                            Anesthesia Physical Anesthesia Plan  ASA: III  Anesthesia Plan: General   Post-op Pain Management:    Induction: Intravenous  Airway Management Planned: Oral ETT  Additional Equipment: None  Intra-op Plan:   Post-operative Plan: Extubation in OR  Informed Consent: I have reviewed the patients History and Physical, chart, labs and discussed the procedure including the risks, benefits and alternatives for the proposed anesthesia with the patient or authorized representative who has indicated his/her understanding and acceptance.   Dental Advisory Given  Plan Discussed with: Anesthesiologist and CRNA  Anesthesia Plan Comments:          Anesthesia Quick Evaluation

## 2015-09-10 NOTE — Progress Notes (Signed)
See progress note.

## 2015-09-10 NOTE — Op Note (Signed)
Preop diagnosis: Scoliosis and listhesis L2-3 L3-4 Postop diagnosis: Same Procedure: Stage I of two-stage procedure:  L2-3 L3-4  lateral lumbar interbody fusion via right anterolateral approach Surgeon: Jabril Pursell Asst.: Nundkumar  After being placed the right side up lateral decubitus position the patient was appropriately aligned and secured to the table in standard fashion. The right flank was then prepped and draped in the usual sterile fashion. Linear incision was made over the L3-4 disc space. The incision was carried down through the fascia muscle and deep fascia which was then opened to allow access into the retroperitoneum. We did finger sweeping and then placed our first dilator. We did electrical testing which showed no abnormal readings. We did sequential dilation and followed into good position under AP lateral fluoroscopy. We placed a retractor and secured to the table with the table arm then gently opened it up. We did electrical testing once again and showed no abnormal readings secured the retractor to the disc space with the shim. We then sized the disc cleaned out thoroughly with a variety of instruments. We release the annulus on the opposite side with the small Cobb elevator. We then did trials and used a 10 mm x 18 mm x 50 mm graft and filled it with morselized allograft. We then impacted without difficulty and fossae showed to be in good position. We irrigated copiously controlled any bleeding with upper coagulation and closed that wound in layers of Vicryl. We then turned our attention to L2-3 we did a similar procedure. Once again entered the retroperitoneum without difficulty and electrical testing which showed no abnormal readings. We did sequential dilation placed our retractor secured to the arm once again. We opened up the blades without difficulty and additional testing and secured the retractor to the disc space with the shim. Once again incised the disc thoroughly cleaned out  release the annulus on the opposite side. We then did size at this level and chose a 10 x 22 x 50 mm cage and filled with morselized allograft. Once again irrigated copiously controlled any bleeding with upper coagulation and then impacted the cage without difficulty. Final fluoroscopy in AP lateral direction looked excellent. We then irrigated the wound once more removed the retractor and final fluoroscopy in AP lateral direction looked excellent both levels. We then closed the wound in multiple layers of Vicryl and then put Dermabond on the skin. Shortness was then applied the patient was extubated and taken to recovery in stable condition.

## 2015-09-10 NOTE — H&P (Signed)
Darryl Navarro is an 70 y.o. male.   Chief Complaint: Left buttock and thigh pain HPI: The patient is a 70 year old gentleman who was evaluated in the office for left buttock and thigh pain of many months duration. There is no inciting event. It episode back in 2015 which was mostly right-sided difficulty. The pain resolved but then the pain returned on the left. He has tried prednisone he takes blood thinners so he has some limitations regarding medications. He says that walking makes the pain worse and sitting eases things off. Cystoscopy leaning forward we'll help. An MRI scan was done back in June which showed some stenosis at L2-3 and L4-5 with a mild listhesis at L4-5. There is fluid at the joints at L4-5 and L3-4 there is some lateral recess stenosis. The patient underwent myelography which confirmed many of the issues that we saw the MRI including scoliosis based at L2-3 with severe stenosis and listhesis at L4-5 and moderate stenosis at L3-4. At that time the options were discussed. The patient requested surgery. Was felt that we would do best with a two-stage procedure. He now comes in for the first phase was used to do a lateral fusion at L2-3 and L3-4 and then to bring him back a few days later with a decompression L2-3 on the left and a platelet at L4-5 and then do percutaneous pedicle screws from L2-L5. He understands the plan and agrees with it. At this time we had long discussion regarding the risks and benefits of surgical intervention. The risks discussed include but are not limited to bleeding and infection weakness numbness paralysis trouble instrumentation nonunion coma and death. We discussed the particular abnormalities associated with going through the so as muscle. We discussed alternative methods of therapy offered risks and benefits of nonintervention. He's had the opportunity to ask numerous questions and appears to understand. With this information in hand he has requested a  proceed with surgery.  Past Medical History  Diagnosis Date  . Paroxysmal atrial fibrillation (HCC)   . Neuropathy ([place])   . CVA (cerebral infarction)     no deficits  . Diabetes mellitus     type II  . Cancer ([place])     skin;  basal cell  . Hypertension   . Arthritis     minor joints  . Numbness and tingling     hands and feet    Past Surgical History  Procedure Laterality Date  . Knee surgery      Bilateral    Family History  Problem Relation Age of Onset  . Coronary artery disease Father     MI in his 60s   Social History:  reports that he quit smoking about 4 years ago. His smoking use included Cigarettes. He has a 25 pack-year smoking history. He has never used smokeless tobacco. He reports that he does not drink alcohol or use illicit drugs.  Allergies: No Known Allergies  Medications Prior to Admission  Medication Sig Dispense Refill  . amLODipine (NORVASC) 5 MG tablet Take 5 mg by mouth daily.    Marland Kitchen atorvastatin (LIPITOR) 10 MG tablet Take 10 mg by mouth daily.     . dabigatran ([name]) 150 MG CAPS capsule TAKE ONE CAPSULE EVERY 12 HOURS 60 capsule 11  . glimepiride (AMARYL) 2 MG tablet Take 2 mg by mouth daily with breakfast.     . ibuprofen (ADVIL,MOTRIN) 200 MG tablet Take 800 mg by mouth every 6 (six) hours as needed. For  pain    . lisinopril (PRINIVIL,ZESTRIL) 20 MG tablet Take 20 mg by mouth daily.     . metFORMIN (GLUCOPHAGE) 1000 MG tablet Take 1,000 mg by mouth 2 (two) times daily with a meal.    . metoprolol (LOPRESSOR) 50 MG tablet Take 50 mg by mouth daily.       Results for orders placed or performed during the hospital encounter of 09/10/15 (from the past 48 hour(s))  PTT Day of Surgery     Status: None   Collection Time: 09/10/15  6:29 AM  Result Value Ref Range   aPTT 31 24 - 37 seconds  Glucose, capillary     Status: Abnormal   Collection Time: 09/10/15  6:42 AM  Result Value Ref Range   Glucose-Capillary 137 (H) 65 - 99 mg/dL   No  results found.  Significant for the issues mentioned history of present illness  Blood pressure 115/65, pulse 49, temperature 98.5 F (36.9 C), resp. rate 18, SpO2 97 %.  The patient is awake alert and oriented. He has no facial asymmetry. Reflexes are absent and equal. Assessment/Plan Impression is that of listhesis, stenosis and scoliosis. The plan is for a 2 level lateral fusion at L 23 and L 34. Faythe Ghee, MD 09/10/2015, 7:30 AM

## 2015-09-10 NOTE — Progress Notes (Signed)
Utilization review completed.  

## 2015-09-10 NOTE — Anesthesia Procedure Notes (Signed)
Procedure Name: Intubation Date/Time: 09/10/2015 7:41 AM Performed by: Kyung Rudd Pre-anesthesia Checklist: Patient identified, Emergency Drugs available, Suction available, Patient being monitored and Timeout performed Patient Re-evaluated:Patient Re-evaluated prior to inductionOxygen Delivery Method: Circle system utilized Preoxygenation: Pre-oxygenation with 100% oxygen Intubation Type: IV induction Ventilation: Mask ventilation without difficulty Laryngoscope Size: Mac and 4 Grade View: Grade I Tube type: Oral Tube size: 7.5 mm Number of attempts: 1 Airway Equipment and Method: Stylet and LTA kit utilized Placement Confirmation: ETT inserted through vocal cords under direct vision,  positive ETCO2 and breath sounds checked- equal and bilateral Secured at: 22 cm Tube secured with: Tape Dental Injury: Teeth and Oropharynx as per pre-operative assessment

## 2015-09-10 NOTE — Progress Notes (Signed)
Pt received at this time alert, verbal with no noted distress. Pt calm, unaware of combative behavior earlier. Surgical dressing site dry and intact. Pt oriented to room. Safety measures in place. Call bell within reach. Wife at bedside. Will continue to monitor.

## 2015-09-10 NOTE — Progress Notes (Signed)
Patient states he has a small thumb wound that looks infected from 'unscrewing something' this week.  Thumb is yellow, black and swollen and patient states he did drain some pus from the thumb this past week.  Have paged Dr. Hal Neer to let him know; awaiting callback.

## 2015-09-10 NOTE — Anesthesia Postprocedure Evaluation (Addendum)
Anesthesia Post Note  Patient: Darryl Navarro  Procedure(s) Performed: Procedure(s) (LRB): RIGHT ANTERIOR LATERAL LUMBAR FUSION LUMBAR TWO-THREE,LUMBAR THREE-FOUR (Right)  Patient location during evaluation: PACU Anesthesia Type: General Level of consciousness: awake and alert Pain management: pain level controlled Vital Signs Assessment: post-procedure vital signs reviewed and stable Respiratory status: spontaneous breathing, nonlabored ventilation, respiratory function stable and patient connected to nasal cannula oxygen Cardiovascular status: blood pressure returned to baseline and stable Postop Assessment: no signs of nausea or vomiting Anesthetic complications: no Comments: Patient agitated upon awakening from anesthesia to the point he was fighting with pacu staff with his main complaint being he had to urinate (to the point he pulled out his IV and caused himself a left forearm superficial abrasion, requiring multiple staff members to restrain him to not injure staff).  Mr. Ary had a foley catheter in place which was clearly irritating him. Dr. Hal Neer okay with it being removed. He was never paralyzed so he cant be weak following surgery, his pulmonary status is optimal and he is moving all extreme ties and will answer questions well, just remains agitated. We gave him 118mcg fentanyl and he was still combative and resistive to redirection from nursing and myself, so 2mg  IV haldol was given after I re-established a 20G IV in his left hand.      Last Vitals:  Filed Vitals:   09/10/15 0637 09/10/15 1130  BP: 115/65   Pulse: 49   Temp: 36.9 C 36.7 C  Resp: 18     Last Pain: There were no vitals filed for this visit.               Zenaida Deed

## 2015-09-10 NOTE — Transfer of Care (Signed)
Immediate Anesthesia Transfer of Care Note  Patient: Darryl Navarro  Procedure(s) Performed: Procedure(s): RIGHT ANTERIOR LATERAL LUMBAR FUSION LUMBAR TWO-THREE,LUMBAR THREE-FOUR (Right)  Patient Location: PACU  Anesthesia Type:General  Level of Consciousness: awake, alert  and oriented  Airway & Oxygen Therapy: Patient Spontanous Breathing and Patient connected to nasal cannula oxygen  Post-op Assessment: Report given to RN, Post -op Vital signs reviewed and stable and Patient moving all extremities  Post vital signs: Reviewed and stable  Last Vitals:  Filed Vitals:   09/10/15 0637  BP: 115/65  Pulse: 49  Temp: 36.9 C  Resp: 18    Complications: No apparent anesthesia complications

## 2015-09-10 NOTE — Progress Notes (Signed)
Pt became very combative. Dr. [name] notified and two other male techs to bedside to help restrain pt from harm to self. Pt had already managed to pull out IV. Pt also received skin care to left elbow during combative phase--dsg applied per Dr.Judd. New IVstarted in right hand and Dr.Judd pushed Fentanyl 135mcg IV. Dr.Kritzer notified pt c/o needing to pee so okayed d/c foley cath. Haldol 2mg  IV ordered per Dr.Judd and given. Pt calming down. Will cont to monitor closely.

## 2015-09-11 ENCOUNTER — Encounter (HOSPITAL_COMMUNITY): Payer: Self-pay | Admitting: Neurosurgery

## 2015-09-11 LAB — GLUCOSE, CAPILLARY
GLUCOSE-CAPILLARY: 101 mg/dL — AB (ref 65–99)
GLUCOSE-CAPILLARY: 91 mg/dL (ref 65–99)
GLUCOSE-CAPILLARY: 82 mg/dL (ref 65–99)
GLUCOSE-CAPILLARY: 89 mg/dL (ref 65–99)

## 2015-09-11 NOTE — Progress Notes (Signed)
Patient ID: Darryl Navarro, male   DOB: [birth year], 70 y.o.   MRN: [id number] Afeb, vss No new neuro issues Feels good after surgery yesterday. Plan is for l 45 plif, L 23 decompressive lam, and perc screws tomorrow. Risks benefits discussed. Questions answered. Requests that we proceed as scheduled.

## 2015-09-12 ENCOUNTER — Inpatient Hospital Stay (HOSPITAL_COMMUNITY): Payer: Medicare Other | Admitting: Anesthesiology

## 2015-09-12 ENCOUNTER — Inpatient Hospital Stay (HOSPITAL_COMMUNITY): Payer: Medicare Other

## 2015-09-12 ENCOUNTER — Inpatient Hospital Stay (HOSPITAL_COMMUNITY): Admission: RE | Admit: 2015-09-12 | Payer: Medicare Other | Source: Ambulatory Visit | Admitting: Neurosurgery

## 2015-09-12 ENCOUNTER — Encounter (HOSPITAL_COMMUNITY)
Admission: RE | Disposition: A | Discharge status: Home / Self Care | Payer: Self-pay | Source: Ambulatory Visit | Attending: Neurosurgery

## 2015-09-12 LAB — GLUCOSE, CAPILLARY
GLUCOSE-CAPILLARY: 166 mg/dL — AB (ref 65–99)
GLUCOSE-CAPILLARY: 171 mg/dL — AB (ref 65–99)
GLUCOSE-CAPILLARY: 192 mg/dL — AB (ref 65–99)
Glucose-Capillary: 114 mg/dL — ABNORMAL HIGH (ref 65–99)
Glucose-Capillary: 188 mg/dL — ABNORMAL HIGH (ref 65–99)

## 2015-09-12 SURGERY — POSTERIOR LUMBAR FUSION 1 LEVEL
Anesthesia: General | Site: Back

## 2015-09-12 MED ORDER — SODIUM CHLORIDE 0.9 % IR SOLN
Status: DC | PRN
  Administered 2015-09-12 (×2)

## 2015-09-12 MED ORDER — FENTANYL CITRATE (PF) 250 MCG/5ML IJ SOLN
INTRAMUSCULAR | Status: AC
  Filled 2015-09-12: qty 5

## 2015-09-12 MED ORDER — LACTATED RINGERS IV SOLN: INTRAVENOUS | Status: DC

## 2015-09-12 MED ORDER — ACETAMINOPHEN 10 MG/ML IV SOLN
INTRAVENOUS | Status: AC
  Administered 2015-09-12: 1000 mg via INTRAVENOUS
  Filled 2015-09-12: qty 100

## 2015-09-12 MED ORDER — NEOSTIGMINE METHYLSULFATE 10 MG/10ML IV SOLN
INTRAVENOUS | Status: AC
  Filled 2015-09-12: qty 1

## 2015-09-12 MED ORDER — THROMBIN 5000 UNITS EX SOLR
CUTANEOUS | Status: DC | PRN
Start: 2015-09-12 — End: 2015-09-12
  Administered 2015-09-12 (×2): 5000 [IU] via TOPICAL

## 2015-09-12 MED ORDER — ROCURONIUM BROMIDE 50 MG/5ML IV SOLN
INTRAVENOUS | Status: AC
  Filled 2015-09-12: qty 2

## 2015-09-12 MED ORDER — CEFAZOLIN SODIUM-DEXTROSE 2-3 GM-% IV SOLR
2.0000 g | Freq: Three times a day (TID) | INTRAVENOUS | Status: AC
  Administered 2015-09-12 – 2015-09-13 (×2): 2 g via INTRAVENOUS
  Filled 2015-09-12 (×3): qty 50

## 2015-09-12 MED ORDER — NEOSTIGMINE METHYLSULFATE 10 MG/10ML IV SOLN
INTRAVENOUS | Status: DC | PRN
  Administered 2015-09-12: 4 mg via INTRAVENOUS

## 2015-09-12 MED ORDER — EPHEDRINE SULFATE 50 MG/ML IJ SOLN
INTRAMUSCULAR | Status: AC
  Filled 2015-09-12: qty 1

## 2015-09-12 MED ORDER — DEXAMETHASONE SODIUM PHOSPHATE 4 MG/ML IJ SOLN
4.0000 mg | Freq: Once | INTRAMUSCULAR | Status: AC
  Administered 2015-09-12: 4 mg via INTRAVENOUS
  Filled 2015-09-12: qty 1

## 2015-09-12 MED ORDER — GLYCOPYRROLATE 0.2 MG/ML IJ SOLN
INTRAMUSCULAR | Status: DC | PRN
  Administered 2015-09-12: 0.2 mg via INTRAVENOUS
  Administered 2015-09-12: 0.6 mg via INTRAVENOUS

## 2015-09-12 MED ORDER — ONDANSETRON HCL 4 MG/2ML IJ SOLN
4.0000 mg | INTRAMUSCULAR | Status: DC | PRN
  Administered 2015-09-14 (×2): 4 mg via INTRAVENOUS
  Filled 2015-09-12: qty 2

## 2015-09-12 MED ORDER — PHENYLEPHRINE HCL 10 MG/ML IJ SOLN
INTRAMUSCULAR | Status: DC | PRN
  Administered 2015-09-12 (×2): 40 ug via INTRAVENOUS

## 2015-09-12 MED ORDER — BISACODYL 5 MG PO TBEC: 5.0000 mg | DELAYED_RELEASE_TABLET | Freq: Every day | ORAL | Status: DC | PRN

## 2015-09-12 MED ORDER — SODIUM CHLORIDE 0.9 % IJ SOLN
3.0000 mL | Freq: Two times a day (BID) | INTRAMUSCULAR | Status: DC
  Administered 2015-09-12 – 2015-09-15 (×5): 3 mL via INTRAVENOUS

## 2015-09-12 MED ORDER — VANCOMYCIN HCL 1000 MG IV SOLR
INTRAVENOUS | Status: DC | PRN
  Administered 2015-09-12: 1000 mg via TOPICAL

## 2015-09-12 MED ORDER — ROCURONIUM BROMIDE 100 MG/10ML IV SOLN
INTRAVENOUS | Status: DC | PRN
  Administered 2015-09-12: 30 mg via INTRAVENOUS
  Administered 2015-09-12 (×3): 10 mg via INTRAVENOUS
  Administered 2015-09-12: 20 mg via INTRAVENOUS
  Administered 2015-09-12: 50 mg via INTRAVENOUS

## 2015-09-12 MED ORDER — OXYCODONE-ACETAMINOPHEN 5-325 MG PO TABS
1.0000 | ORAL_TABLET | ORAL | Status: DC | PRN
  Administered 2015-09-12: 1 via ORAL
  Administered 2015-09-12 – 2015-09-15 (×11): 2 via ORAL
  Filled 2015-09-12: qty 1
  Filled 2015-09-12 (×12): qty 2

## 2015-09-12 MED ORDER — ARTIFICIAL TEARS OP OINT
TOPICAL_OINTMENT | OPHTHALMIC | Status: AC
Start: 2015-09-12 — End: 2015-09-12
  Filled 2015-09-12: qty 3.5

## 2015-09-12 MED ORDER — DIPHENHYDRAMINE HCL 50 MG/ML IJ SOLN
INTRAMUSCULAR | Status: DC | PRN
  Administered 2015-09-12: 12.5 mg via INTRAVENOUS

## 2015-09-12 MED ORDER — 0.9 % SODIUM CHLORIDE (POUR BTL) OPTIME
TOPICAL | Status: DC | PRN
  Administered 2015-09-12: 1000 mL

## 2015-09-12 MED ORDER — LACTATED RINGERS IV SOLN
INTRAVENOUS | Status: DC | PRN
  Administered 2015-09-12 (×3): via INTRAVENOUS

## 2015-09-12 MED ORDER — GLYCOPYRROLATE 0.2 MG/ML IJ SOLN
INTRAMUSCULAR | Status: AC
  Filled 2015-09-12: qty 3

## 2015-09-12 MED ORDER — MENTHOL 3 MG MT LOZG
1.0000 | LOZENGE | OROMUCOSAL | Status: DC | PRN
Start: 2015-09-12 — End: 2015-09-15

## 2015-09-12 MED ORDER — DEXAMETHASONE SODIUM PHOSPHATE 10 MG/ML IJ SOLN
INTRAMUSCULAR | Status: AC
  Administered 2015-09-12: 19:00:00
  Filled 2015-09-12: qty 1

## 2015-09-12 MED ORDER — PROPOFOL 10 MG/ML IV BOLUS
INTRAVENOUS | Status: DC | PRN
  Administered 2015-09-12: 150 mg via INTRAVENOUS
  Administered 2015-09-12: 50 mg via INTRAVENOUS
  Administered 2015-09-12 (×3): 20 mg via INTRAVENOUS

## 2015-09-12 MED ORDER — CEFAZOLIN SODIUM-DEXTROSE 2-3 GM-% IV SOLR
INTRAVENOUS | Status: AC
  Filled 2015-09-12: qty 50

## 2015-09-12 MED ORDER — ACETAMINOPHEN 325 MG PO TABS: 650.0000 mg | ORAL_TABLET | ORAL | Status: DC | PRN

## 2015-09-12 MED ORDER — THROMBIN 20000 UNITS EX SOLR
CUTANEOUS | Status: DC | PRN
  Administered 2015-09-12: 10:00:00 via TOPICAL

## 2015-09-12 MED ORDER — DIPHENHYDRAMINE HCL 50 MG/ML IJ SOLN
INTRAMUSCULAR | Status: AC
  Filled 2015-09-12: qty 1

## 2015-09-12 MED ORDER — FENTANYL CITRATE (PF) 100 MCG/2ML IJ SOLN
INTRAMUSCULAR | Status: DC | PRN
  Administered 2015-09-12 (×3): 50 ug via INTRAVENOUS
  Administered 2015-09-12: 100 ug via INTRAVENOUS
  Administered 2015-09-12 (×5): 50 ug via INTRAVENOUS

## 2015-09-12 MED ORDER — PANTOPRAZOLE SODIUM 40 MG IV SOLR: 40.0000 mg | Freq: Every day | INTRAVENOUS | Status: DC

## 2015-09-12 MED ORDER — SODIUM CHLORIDE 0.9 % IJ SOLN: 3.0000 mL | INTRAMUSCULAR | Status: DC | PRN

## 2015-09-12 MED ORDER — HYDROMORPHONE HCL 1 MG/ML IJ SOLN
1.0000 mg | INTRAMUSCULAR | Status: DC | PRN
  Filled 2015-09-12: qty 1

## 2015-09-12 MED ORDER — ACETAMINOPHEN 650 MG RE SUPP: 650.0000 mg | RECTAL | Status: DC | PRN

## 2015-09-12 MED ORDER — EPHEDRINE SULFATE 50 MG/ML IJ SOLN
INTRAMUSCULAR | Status: DC | PRN
  Administered 2015-09-12 (×3): 5 mg via INTRAVENOUS

## 2015-09-12 MED ORDER — DEXAMETHASONE SODIUM PHOSPHATE 4 MG/ML IJ SOLN
INTRAMUSCULAR | Status: AC
  Filled 2015-09-12: qty 1

## 2015-09-12 MED ORDER — ROCURONIUM BROMIDE 50 MG/5ML IV SOLN
INTRAVENOUS | Status: AC
  Filled 2015-09-12: qty 1

## 2015-09-12 MED ORDER — DEXAMETHASONE SODIUM PHOSPHATE 4 MG/ML IJ SOLN
INTRAMUSCULAR | Status: DC | PRN
  Administered 2015-09-12: 4 mg via INTRAVENOUS

## 2015-09-12 MED ORDER — SODIUM CHLORIDE 0.9 % IV SOLN: 250.0000 mL | INTRAVENOUS | Status: DC

## 2015-09-12 MED ORDER — DOCUSATE SODIUM 100 MG PO CAPS
100.0000 mg | ORAL_CAPSULE | Freq: Two times a day (BID) | ORAL | Status: DC
  Administered 2015-09-13 – 2015-09-15 (×4): 100 mg via ORAL
  Filled 2015-09-12 (×5): qty 1

## 2015-09-12 MED ORDER — PROPOFOL 10 MG/ML IV BOLUS
INTRAVENOUS | Status: AC
  Filled 2015-09-12: qty 40

## 2015-09-12 MED ORDER — MIDAZOLAM HCL 2 MG/2ML IJ SOLN
INTRAMUSCULAR | Status: AC
  Filled 2015-09-12: qty 2

## 2015-09-12 MED ORDER — CYCLOBENZAPRINE HCL 10 MG PO TABS
10.0000 mg | ORAL_TABLET | Freq: Three times a day (TID) | ORAL | Status: DC | PRN
  Administered 2015-09-13 – 2015-09-15 (×3): 10 mg via ORAL
  Filled 2015-09-12 (×3): qty 1

## 2015-09-12 MED ORDER — INSULIN ASPART 100 UNIT/ML ~~LOC~~ SOLN
0.0000 [IU] | SUBCUTANEOUS | Status: DC
  Administered 2015-09-12 – 2015-09-13 (×5): 3 [IU] via SUBCUTANEOUS
  Administered 2015-09-14 (×3): 2 [IU] via SUBCUTANEOUS
  Administered 2015-09-14: 3 [IU] via SUBCUTANEOUS
  Administered 2015-09-15 (×3): 2 [IU] via SUBCUTANEOUS

## 2015-09-12 MED ORDER — PHENOL 1.4 % MT LIQD
1.0000 | OROMUCOSAL | Status: DC | PRN
Start: 2015-09-12 — End: 2015-09-15

## 2015-09-12 MED ORDER — HYDROMORPHONE HCL 1 MG/ML IJ SOLN: 0.2500 mg | INTRAMUSCULAR | Status: DC | PRN

## 2015-09-12 MED ORDER — BUPIVACAINE HCL (PF) 0.5 % IJ SOLN
INTRAMUSCULAR | Status: DC | PRN
  Administered 2015-09-12: 20 mL
  Administered 2015-09-12: 30 mL

## 2015-09-12 MED ORDER — STERILE WATER FOR INJECTION IJ SOLN
INTRAMUSCULAR | Status: AC
  Filled 2015-09-12: qty 10

## 2015-09-12 MED ORDER — PHENYLEPHRINE HCL 10 MG/ML IJ SOLN
10.0000 mg | INTRAMUSCULAR | Status: DC | PRN
  Administered 2015-09-12: 10 ug/min via INTRAVENOUS

## 2015-09-12 MED ORDER — SUCCINYLCHOLINE CHLORIDE 20 MG/ML IJ SOLN
INTRAMUSCULAR | Status: AC
  Filled 2015-09-12: qty 1

## 2015-09-12 MED ORDER — MIDAZOLAM HCL 5 MG/5ML IJ SOLN
INTRAMUSCULAR | Status: DC | PRN
  Administered 2015-09-12: 2 mg via INTRAVENOUS

## 2015-09-12 MED ORDER — LIDOCAINE HCL (CARDIAC) 20 MG/ML IV SOLN
INTRAVENOUS | Status: AC
  Filled 2015-09-12: qty 5

## 2015-09-12 MED ORDER — ONDANSETRON HCL 4 MG/2ML IJ SOLN
INTRAMUSCULAR | Status: AC
  Filled 2015-09-12: qty 2

## 2015-09-12 MED ORDER — KCL IN DEXTROSE-NACL 20-5-0.45 MEQ/L-%-% IV SOLN
80.0000 mL/h | INTRAVENOUS | Status: DC
  Filled 2015-09-12: qty 1000

## 2015-09-12 MED ORDER — LIDOCAINE HCL (CARDIAC) 20 MG/ML IV SOLN
INTRAVENOUS | Status: DC | PRN
  Administered 2015-09-12: 100 mg via INTRAVENOUS

## 2015-09-12 MED ORDER — HEMOSTATIC AGENTS (NO CHARGE) OPTIME
TOPICAL | Status: DC | PRN
  Administered 2015-09-12: 1 via TOPICAL

## 2015-09-12 MED ORDER — CEFAZOLIN SODIUM-DEXTROSE 2-3 GM-% IV SOLR
INTRAVENOUS | Status: DC | PRN
  Administered 2015-09-12 (×2): 2 g via INTRAVENOUS

## 2015-09-12 SURGICAL SUPPLY — 68 items
BAG DECANTER FOR FLEXI CONT (MISCELLANEOUS) ×2 IMPLANT
BENZOIN TINCTURE PRP APPL 2/3 (GAUZE/BANDAGES/DRESSINGS) ×2 IMPLANT
BLADE CLIPPER SURG (BLADE) IMPLANT
BONE EQUIVA 5CC (Bone Implant) ×2 IMPLANT
BRUSH SCRUB EZ PLAIN DRY (MISCELLANEOUS) ×2 IMPLANT
BUR CUTTER 7.0 ROUND (BURR) ×4 IMPLANT
BUR MATCHSTICK NEURO 3.0 LAGG (BURR) ×2 IMPLANT
CANISTER SUCT 3000ML PPV (MISCELLANEOUS) ×2 IMPLANT
CONT SPEC 4OZ CLIKSEAL STRL BL (MISCELLANEOUS) ×2 IMPLANT
COVER BACK TABLE 60X90IN (DRAPES) ×2 IMPLANT
DERMABOND ADVANCED (GAUZE/BANDAGES/DRESSINGS) ×5
DERMABOND ADVANCED .7 DNX12 (GAUZE/BANDAGES/DRESSINGS) ×5 IMPLANT
DRAPE C-ARM 42X72 X-RAY (DRAPES) ×4 IMPLANT
DRAPE C-ARMOR (DRAPES) ×2 IMPLANT
DRAPE LAPAROTOMY 100X72X124 (DRAPES) ×2 IMPLANT
DRAPE SURG 17X23 STRL (DRAPES) ×4 IMPLANT
DRSG OPSITE POSTOP 4X6 (GAUZE/BANDAGES/DRESSINGS) ×6 IMPLANT
DURAPREP 26ML APPLICATOR (WOUND CARE) ×2 IMPLANT
ELECT REM PT RETURN 9FT ADLT (ELECTROSURGICAL) ×2
ELECTRODE REM PT RTRN 9FT ADLT (ELECTROSURGICAL) ×1 IMPLANT
EVACUATOR 1/8 PVC DRAIN (DRAIN) IMPLANT
GAUZE SPONGE 4X4 12PLY STRL (GAUZE/BANDAGES/DRESSINGS) ×2 IMPLANT
GAUZE SPONGE 4X4 16PLY XRAY LF (GAUZE/BANDAGES/DRESSINGS) IMPLANT
GLOVE BIOGEL PI IND STRL 7.5 (GLOVE) ×2 IMPLANT
GLOVE BIOGEL PI IND STRL 8 (GLOVE) ×4 IMPLANT
GLOVE BIOGEL PI INDICATOR 7.5 (GLOVE) ×2
GLOVE BIOGEL PI INDICATOR 8 (GLOVE) ×4
GLOVE ECLIPSE 7.5 STRL STRAW (GLOVE) ×6 IMPLANT
GLOVE ECLIPSE 8.0 STRL XLNG CF (GLOVE) ×4 IMPLANT
GLOVE EXAM NITRILE LRG STRL (GLOVE) IMPLANT
GLOVE EXAM NITRILE MD LF STRL (GLOVE) IMPLANT
GLOVE EXAM NITRILE XS STR PU (GLOVE) IMPLANT
GOWN STRL REUS W/ TWL LRG LVL3 (GOWN DISPOSABLE) IMPLANT
GOWN STRL REUS W/ TWL XL LVL3 (GOWN DISPOSABLE) ×3 IMPLANT
GOWN STRL REUS W/TWL 2XL LVL3 (GOWN DISPOSABLE) ×4 IMPLANT
GOWN STRL REUS W/TWL LRG LVL3 (GOWN DISPOSABLE)
GOWN STRL REUS W/TWL XL LVL3 (GOWN DISPOSABLE) ×3
HANDLE PEDIGUARD CANNULATED (INSTRUMENTS) ×2 IMPLANT
K-WIRE NITHNOL TROCAR TIP (WIRE) ×16 IMPLANT
KIT BASIN OR (CUSTOM PROCEDURE TRAY) ×2 IMPLANT
KIT ROOM TURNOVER OR (KITS) ×2 IMPLANT
LIQUID BAND (GAUZE/BANDAGES/DRESSINGS) IMPLANT
NEEDLE 1 PEDIGUARD CANNULATED (NEEDLE) ×4 IMPLANT
NEEDLE HYPO 22GX1.5 SAFETY (NEEDLE) ×2 IMPLANT
NS IRRIG 1000ML POUR BTL (IV SOLUTION) ×2 IMPLANT
PACK LAMINECTOMY NEURO (CUSTOM PROCEDURE TRAY) ×2 IMPLANT
PAD ARMBOARD 7.5X6 YLW CONV (MISCELLANEOUS) ×6 IMPLANT
PATTIES SURGICAL .75X.75 (GAUZE/BANDAGES/DRESSINGS) IMPLANT
ROD PREBENT PERC 10MM (Rod) ×4 IMPLANT
SCREW MIN INVASIVE 6.5X45 (Screw) ×12 IMPLANT
SCREW PATHFINDER 6.5X50 (Screw) ×4 IMPLANT
SHEATH PAT (SHEATH) ×2 IMPLANT
SPACER ZYSTON STR 12X25X10 CVX (Spacer) ×4 IMPLANT
SPONGE LAP 4X18 X RAY DECT (DISPOSABLE) IMPLANT
SPONGE SURGIFOAM ABS GEL 100 (HEMOSTASIS) ×2 IMPLANT
STRIP CLOSURE SKIN 1/2X4 (GAUZE/BANDAGES/DRESSINGS) ×4 IMPLANT
STRIP CLOSURE SKIN 1/4X4 (GAUZE/BANDAGES/DRESSINGS) ×8 IMPLANT
SUT PROLENE 0 CT 1 30 (SUTURE) ×2 IMPLANT
SUT VIC AB 0 CT1 18XCR BRD8 (SUTURE) ×3 IMPLANT
SUT VIC AB 0 CT1 8-18 (SUTURE) ×3
SUT VIC AB 2-0 OS6 18 (SUTURE) ×20 IMPLANT
SUT VIC AB 3-0 CP2 18 (SUTURE) ×6 IMPLANT
TOP CLSR SEQUOIA (Orthopedic Implant) ×16 IMPLANT
TOWEL OR 17X24 6PK STRL BLUE (TOWEL DISPOSABLE) ×2 IMPLANT
TOWEL OR 17X26 10 PK STRL BLUE (TOWEL DISPOSABLE) ×2 IMPLANT
TRAP SPECIMEN MUCOUS 40CC (MISCELLANEOUS) ×2 IMPLANT
TRAY FOLEY W/METER SILVER 14FR (SET/KITS/TRAYS/PACK) ×2 IMPLANT
WATER STERILE IRR 1000ML POUR (IV SOLUTION) ×2 IMPLANT

## 2015-09-12 NOTE — Anesthesia Preprocedure Evaluation (Addendum)
Anesthesia Evaluation  Patient identified by MRN, date of birth, ID band Patient awake    Reviewed: Allergy & Precautions, H&P , NPO status , Patient's Chart, lab work & pertinent test results  History of Anesthesia Complications (+) Emergence Delirium  Airway Mallampati: II  TM Distance: >3 FB Neck ROM: full    Dental  (+) Dental Advisory Given, Chipped Left upper front chipped:   Pulmonary neg pulmonary ROS, former smoker,    Pulmonary exam normal breath sounds clear to auscultation       Cardiovascular hypertension, Pt. on medications and Pt. on home beta blockers Normal cardiovascular exam+ dysrhythmias Atrial Fibrillation  Rhythm:regular Rate:Normal  Cardiomyopathy. 2013 echo EF 40%   Neuro/Psych neuropathy CVA, No Residual Symptoms negative psych ROS   GI/Hepatic negative GI ROS, Neg liver ROS,   Endo/Other  diabetes, Well Controlled, Type 2, Insulin Dependent, Oral Hypoglycemic Agents  Renal/GU negative Renal ROS  negative genitourinary   Musculoskeletal   Abdominal   Peds  Hematology negative hematology ROS (+)   Anesthesia Other Findings   Reproductive/Obstetrics negative OB ROS                           Anesthesia Physical Anesthesia Plan  ASA: III  Anesthesia Plan: General   Post-op Pain Management:    Induction: Intravenous  Airway Management Planned: Oral ETT  Additional Equipment:   Intra-op Plan:   Post-operative Plan: Extubation in OR  Informed Consent: I have reviewed the patients History and Physical, chart, labs and discussed the procedure including the risks, benefits and alternatives for the proposed anesthesia with the patient or authorized representative who has indicated his/her understanding and acceptance.   Dental Advisory Given  Plan Discussed with: CRNA and Surgeon  Anesthesia Plan Comments:         Anesthesia Quick Evaluation

## 2015-09-12 NOTE — Transfer of Care (Signed)
Immediate Anesthesia Transfer of Care Note  Patient: Darryl Navarro  Procedure(s) Performed: Procedure(s): Stage 2 Posterior Lumbar Interbody Fusion - Lumbar Four-Five PLIF with Left Lumbar Two-Three Decompressive Laminectomy with Percutaneous Screws Lumbar Two-Lumbar Five (N/A)  Patient Location: PACU  Anesthesia Type:General  Level of Consciousness: sedated  Airway & Oxygen Therapy: Patient Spontanous Breathing and Patient connected to face mask oxygen  Post-op Assessment: Report given to RN, Post -op Vital signs reviewed and stable and Patient moving all extremities X 4  Post vital signs: Reviewed and stable  Last Vitals:  Filed Vitals:   09/12/15 1425 09/12/15 1430  BP: 95/61 92/62  Pulse: 66 63  Temp: 36.6 C   Resp: 25 20    Complications: No apparent anesthesia complications

## 2015-09-12 NOTE — Progress Notes (Signed)
Pt arrived to 5M18 @ 1551, Pt A&Ox 4, c/o pain 0/10. Dressing CDI.Marland Kitchen Pt VS taken. Foley intact, unclamped. Pt without distress. Family at the bedside. Diet ordered, will monitor.

## 2015-09-12 NOTE — Progress Notes (Signed)
Patient just left the floor for surgery.

## 2015-09-12 NOTE — Progress Notes (Signed)
Called Dr.Jackson for sign out--okay to d/c to room at this time

## 2015-09-12 NOTE — Progress Notes (Signed)
Noted that the Patient had some facial edema, patient refused bednadryl stated that he only needed some hand lotion to place on his face. Called Dr. Ronnald Ramp neuro on call physiscian was given verbal order to give 4 mg IV decadron IV

## 2015-09-12 NOTE — Op Note (Signed)
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 Localizing x-rays taken prior to incision to identify the appropriate level. Initially midline incision was made above the spinous processes of L2 and L3. The incision was carried on the spinous processes and subperiosteal dissection was then carried out on the left side of the spinous processes and lamina and subcutaneous tract was placed for exposure. X-ray showed approach the appropriate level. Using the high-speed drill the inferior one half of the L2 lamina the medial third of the facet joint the superior one third of the L3 lamina were removed. Residual bone and markedly hypertrophic ligament flavum removed in a piecemeal fashion to decompress the lateral aspect of the thecal sac and L3 nerve root. Was followed out its foramen until was well decompressed well visualized. We actually incised the disc to make sure that the interbody device was well seated weakened visualized in good position. We irrigated copiously controlled any bleeding with upper coagulation Gelfoam. We closed the wound in multiple layers of Vicryl with Dermabond and Steri-Strips on the skin. We then turned our attention to L4-5 were midline incision was made above the spinous processes. We carried the incision down the spinous processes and then did bilateral subperiosteal dissection  along the spinous processes lamina facet joint bilaterally. Self retaining tract was placed for exposure Dr. should approach the appropriate level. Using Leksell rongeur spinous processes of L4 and L5 were removed. On the patient's left side generous laminotomy was performed by removing the inferior 80% of the L4 lamina the medial three quarters the facet joint the superior one third of the L5 lamina. Residual bone and markedly hypertrophic ligament were removed to relieve the lateral recess and central stenosis. Similar decompression was then carried on the opposite side and the residual midline structures were removed to complete the bilateral decompressive laminectomy. L5 nerve roots well visualized well decompressed. Disc space was then entered and thoroughly cleaned out bilaterally with a variety of instruments. There is a large amount of herniated disc material this was removed to get excellent decompression of the central and lateral recess stenosis. We prepared the disc space for interbody fusion distracted the disc space up to 12 mm size which was found to be a good choice. We filled to 12 x 10 x 25 mm cages with a mixture of autologous bone and morselized allograft and impacted them into the disc space without difficulty. Fossae showed to be in good position. This was done bilaterally. Prior to placing the second space we put a mixture of Vitoss bone morselized allograft deep within the interspace to help with interbody fusion. We then irrigated copiously and close this part of the incision with multiple layers of Vicryl. We then placed for cutaneous pedicle screws at L2 L3 L4 and L5 bilaterally. We passed Jamshidi needles placed wires and then tapped with a 6 mm tap. We placed 6.5 x 45 mm screws at L3-L4 and L5 bilaterally and 6.5 x 50 Milner screws at L2 bilaterally. We passed rod to the towers and  secured them to the top of the screws with top loading nuts. We did tightening and final tightening with  torque and counter torque and final fluoroscopy in AP lateral direction looked excellent. Irrigation was carried out and these wounds were closed in multiple layers of Vicryl. Dermabond Steri-Strips were placed on all of the incisions followed by sterile dressing. At this time the patient was extubated to recovery in stable condition.

## 2015-09-12 NOTE — Anesthesia Procedure Notes (Signed)
Procedure Name: Intubation Date/Time: 09/12/2015 7:42 AM Performed by: [name] Pre-anesthesia Checklist: Patient identified, Emergency Drugs available, Suction available, Patient being monitored and Timeout performed Patient Re-evaluated:Patient Re-evaluated prior to inductionOxygen Delivery Method: Circle system utilized Preoxygenation: Pre-oxygenation with 100% oxygen Intubation Type: IV induction Ventilation: Mask ventilation without difficulty and Oral airway inserted - appropriate to patient size Laryngoscope Size: Mac and 4 Grade View: Grade I Tube type: Oral Tube size: 7.5 mm Number of attempts: 1 Airway Equipment and Method: Stylet Placement Confirmation: ETT inserted through vocal cords under direct vision,  positive ETCO2 and breath sounds checked- equal and bilateral Secured at: 21 cm Tube secured with: Tape Dental Injury: Teeth and Oropharynx as per pre-operative assessment

## 2015-09-12 NOTE — Progress Notes (Signed)
Orthopedic Tech Progress Note Patient Details:  Darryl Navarro [birth year] [id number]  Patient ID: Darryl Navarro, male   DOB: 09-04-1946, 70 y.o.   MRN: [id number]   [name] 09/12/2015, 7:54 AMCalled [employer] for Aspen lumbar brace.

## 2015-09-13 LAB — GLUCOSE, CAPILLARY
GLUCOSE-CAPILLARY: 184 mg/dL — AB (ref 65–99)
GLUCOSE-CAPILLARY: 148 mg/dL — AB (ref 65–99)
Glucose-Capillary: 166 mg/dL — ABNORMAL HIGH (ref 65–99)
Glucose-Capillary: 137 mg/dL — ABNORMAL HIGH (ref 65–99)
Glucose-Capillary: 152 mg/dL — ABNORMAL HIGH (ref 65–99)

## 2015-09-13 MED ORDER — PANTOPRAZOLE SODIUM 40 MG PO TBEC
40.0000 mg | DELAYED_RELEASE_TABLET | Freq: Every day | ORAL | Status: DC
  Administered 2015-09-14: 40 mg via ORAL
  Filled 2015-09-13 (×2): qty 1

## 2015-09-13 MED ORDER — TAMSULOSIN HCL 0.4 MG PO CAPS
0.4000 mg | ORAL_CAPSULE | Freq: Every day | ORAL | Status: DC
  Administered 2015-09-13 – 2015-09-15 (×3): 0.4 mg via ORAL
  Filled 2015-09-13 (×3): qty 1

## 2015-09-13 NOTE — Care Management Important Message (Signed)
Important Message  Patient Details  Name: Darryl Navarro MRN: [id number] Date of Birth: 09-10-1945   Medicare Important Message Given:  Yes    Dellene Mcgroarty P Lavontae Cornia 09/13/2015, 2:30 PM

## 2015-09-13 NOTE — Anesthesia Postprocedure Evaluation (Signed)
Anesthesia Post Note  Patient: Darryl Navarro  Procedure(s) Performed: Procedure(s) (LRB): Stage 2 Posterior Lumbar Interbody Fusion - Lumbar Four-Five PLIF with Left Lumbar Two-Three Decompressive Laminectomy with Percutaneous Screws Lumbar Two-Lumbar Five (N/A)  Patient location during evaluation: PACU Anesthesia Type: General Level of consciousness: awake and alert Pain management: pain level controlled Vital Signs Assessment: post-procedure vital signs reviewed and stable Respiratory status: spontaneous breathing, nonlabored ventilation, respiratory function stable and patient connected to nasal cannula oxygen Cardiovascular status: blood pressure returned to baseline and stable Postop Assessment: no signs of nausea or vomiting Anesthetic complications: no    Last Vitals:  Filed Vitals:   09/13/15 0208 09/13/15 0603  BP: 111/58 100/61  Pulse: 59 58  Temp: 36.7 C 36.8 C  Resp: 18 18    Last Pain:  Filed Vitals:   09/13/15 0649  PainSc: 6                  Rubie Ficco L

## 2015-09-13 NOTE — Progress Notes (Signed)
Patient ID: Darryl Navarro, male   DOB: 05-15-1946, 70 y.o.   MRN: [id number] Afeb, vss Feels good regarding leg pain.  Wound clean and dry. Ambulated well. Some uriary issues. Will start flomax. Plans to go home tomorrow.

## 2015-09-13 NOTE — Evaluation (Signed)
Physical Therapy Evaluation Patient Details Name: Darryl Navarro MRN: [id number] DOB: 25-Feb-1946 Today's Date: 09/13/2015   History of Present Illness  Pt s/p two part L2-L5 fusion with rt anterolateral approach 1/3 and posterior approach 1/5. PMH - Neuropathy, DM, CVA, HTN  Clinical Impression  Pt admitted with above diagnosis and presents to PT with functional limitations due to deficits listed below (See PT problem list). Pt needs skilled PT to maximize independence and safety to allow discharge to home with wife.      Follow Up Recommendations No PT follow up    Equipment Recommendations  None recommended by PT    Recommendations for Other Services       Precautions / Restrictions Precautions Precautions: Back Required Braces or Orthoses: Spinal Brace Spinal Brace: Thoracolumbosacral orthotic;Applied in sitting position Restrictions Weight Bearing Restrictions: No      Mobility  Bed Mobility Overal bed mobility: Needs Assistance Bed Mobility: Sidelying to Sit;Rolling;Sit to Sidelying Rolling: Supervision Sidelying to sit: Min guard     Sit to sidelying: Min guard General bed mobility comments: Verbal cues for technique  Transfers Overall transfer level: Needs assistance Equipment used: Rolling walker (2 wheeled) Transfers: Sit to/from Stand Sit to Stand: Min guard         General transfer comment: Verbal cues for hand placement  Ambulation/Gait Ambulation/Gait assistance: Supervision Ambulation Distance (Feet): 200 Feet Assistive device: Rolling walker (2 wheeled) Gait Pattern/deviations: Step-through pattern;Decreased stride length;Trunk flexed Gait velocity: decr Gait velocity interpretation: Below normal speed for age/gender General Gait Details: Verbal cues to stand more erect  Stairs Stairs: Yes Stairs assistance: Min assist Stair Management: One rail Right;Step to pattern;Forwards Number of Stairs: 2 General stair comments: Hand-held on 1  side and rail on other  Wheelchair Mobility    Modified Rankin (Stroke Patients Only)       Balance Overall balance assessment: Needs assistance Sitting-balance support: No upper extremity supported;Feet supported Sitting balance-Leahy Scale: Good     Standing balance support: No upper extremity supported Standing balance-Leahy Scale: Fair Standing balance comment: Walker for support due to pain not balance issues                             Pertinent Vitals/Pain Pain Assessment: 0-10 Pain Score: 5  Pain Location: back Pain Descriptors / Indicators: Operative site guarding;Sore Pain Intervention(s): Limited activity within patient's tolerance;Premedicated before session;Repositioned    Home Living Family/patient expects to be discharged to:: Private residence Living Arrangements: Spouse/significant other Available Help at Discharge: Family;Available 24 hours/day Type of Home: House Home Access: Stairs to enter Entrance Stairs-Rails: None Entrance Stairs-Number of Steps: 2 Home Layout: Two level;Able to live on main level with bedroom/bathroom Home Equipment: Kasandra Knudsen - single point      Prior Function Level of Independence: Independent               Hand Dominance        Extremity/Trunk Assessment   Upper Extremity Assessment: Defer to OT evaluation           Lower Extremity Assessment: Overall WFL for tasks assessed         Communication   Communication: No difficulties  Cognition Arousal/Alertness: Awake/alert Behavior During Therapy: WFL for tasks assessed/performed Overall Cognitive Status: Within Functional Limits for tasks assessed                      General Comments  Exercises        Assessment/Plan    PT Assessment Patient needs continued PT services  PT Diagnosis Difficulty walking;Acute pain   PT Problem List Decreased activity tolerance;Decreased mobility;Decreased knowledge of use of DME;Decreased  knowledge of precautions;Pain  PT Treatment Interventions DME instruction;Gait training;Functional mobility training;Therapeutic activities;Patient/family education   PT Goals (Current goals can be found in the Care Plan section) Acute Rehab PT Goals Patient Stated Goal: return home PT Goal Formulation: With patient Time For Goal Achievement: 09/20/15 Potential to Achieve Goals: Good    Frequency Min 5X/week   Barriers to discharge        Co-evaluation               End of Session Equipment Utilized During Treatment: Back brace Activity Tolerance: Patient tolerated treatment well Patient left: in bed;with call bell/phone within reach           Time: [id number] PT Time Calculation (min) (ACUTE ONLY): 20 min   Charges:   PT Evaluation $PT Eval Moderate Complexity: 1 Procedure     PT G Codes:        Darryl Navarro 10-12-2015, 9:10 AM Darryl Navarro PT [phone]

## 2015-09-13 NOTE — Care Management Note (Addendum)
Case Management Note  Patient Details  Name: Darryl Navarro MRN: [id number] Date of Birth: Jul 10, 1946  Subjective/Objective:                    Action/Plan: CM received a call that patient's wife is requesting a hospital bed for discharge.  On review of PT notes, no DME or follow-up has been recommended. CM spoke with both patient and wife at bedside to discuss hospital bed request.  Patient and wife are aware that a hospital bed requires a physician's order and must be certified as medical necessity. Per wife, if insurance does not approve the bed, they are potentially interested in private pay rental.  CM left a voicemail for [place] with [facility] DME to request information on rates.  Awaiting return call. Message was left on chart for Dr Hal Neer to notify of request. Patient and wife were encouraged to ask Dr Hal Neer when he rounds.  Bedside RN was updated. CM will continue to follow for discharge needs.  Addendum:  Patient's wife was informed that a hospital bed is approximately $150 per month, private pay.  PT is now recommending a rolling walker.  This was added to the message on the chart.  Expected Discharge Date:                  Expected Discharge Plan:  Home/Self Care  In-House Referral:     Discharge planning Services  CM Consult  Post Acute Care Choice:  Durable Medical Equipment Choice offered to:     DME Arranged:    DME Agency:     HH Arranged:    [place] Agency:     Status of Service:  In process, will continue to follow  Medicare Important Message Given:    Date Medicare IM Given:    Medicare IM give by:    Date Additional Medicare IM Given:    Additional Medicare Important Message give by:     If discussed at [place] of Stay Meetings, dates discussed:    Additional Comments:  Rolm Baptise, RN 09/13/2015, 10:55 AM [phone]

## 2015-09-13 NOTE — Evaluation (Signed)
Occupational Therapy Evaluation Patient Details Name: Darryl Navarro MRN: [id number] DOB: 1946-06-18 Today's Date: 09/13/2015    History of Present Illness Pt s/p two part L2-L5 fusion with rt anterolateral approach 1/3 and posterior approach 1/5. PMH - Neuropathy, DM, CVA, HTN   Clinical Impression   Pt admitted with above diagnosis and presents to OT with functional limitations due to deficits listed below (see OT Problem List) which impact his ability to complete ADLs at PLOF.  Pt reluctant to participate in OT eval, reporting that his wife will be with him and will provide all assistance that he needs.  Pt able to recall 3/3 back precautions.  Provided back precautions handout.  Discussed AE to assist with LB dressing and hygiene, with pt not interested but wife interested in long handled sponge.  Pt does not require follow up OT at this time.    Follow Up Recommendations  No OT follow up    Equipment Recommendations  None recommended by OT (pt has all necessary equipment)    Recommendations for Other Services       Precautions / Restrictions Precautions Precautions: Back Required Braces or Orthoses: Spinal Brace Spinal Brace: Thoracolumbosacral orthotic;Applied in sitting position Restrictions Weight Bearing Restrictions: No      Mobility Bed Mobility Overal bed mobility: Needs Assistance Bed Mobility: Rolling;Sidelying to Sit Rolling: Supervision Sidelying to sit: Supervision     Sit to sidelying: Min guard General bed mobility comments: Pt with good recall of technique from earlier PT session  Transfers Overall transfer level: Needs assistance Equipment used: Rolling walker (2 wheeled) Transfers: Sit to/from Stand Sit to Stand: Supervision         General transfer comment: Verbal cues for hand placement and upright posture    Balance Overall balance assessment: Needs assistance Sitting-balance support: No upper extremity supported;Feet  supported Sitting balance-Leahy Scale: Good     Standing balance support: No upper extremity supported Standing balance-Leahy Scale: Fair Standing balance comment: Walker for support due to pain not balance issues                            ADL Overall ADL's : Needs assistance/impaired     Grooming: Wash/dry hands;Wash/dry face;Oral care;Supervision/safety;Standing Grooming Details (indicate cue type and reason): Cues for upright standing posture             Lower Body Dressing: With caregiver independent assisting;Maximal assistance Lower Body Dressing Details (indicate cue type and reason): Pt reluctant to utilize AE to assist with LB self-care tasks, reporting that his wife will be there to assist and she has been assisting with LB dressing.  Therapist still educted pt on equipment, although he reports he won't use it Toilet Transfer: Supervision/safety;Ambulation;BSC           Functional mobility during ADLs: Supervision/safety;Rolling walker General ADL Comments: Completed grooming tasks in standing and pt declined LB dressing tasks as he reports wife will assist him with everything.  Overall supervision with RW with min cues for upright posture in standing and with ambulation     Vision Vision Assessment?: No apparent visual deficits          Pertinent Vitals/Pain Pain Assessment: 0-10 Pain Score: 5  Pain Location: back Pain Descriptors / Indicators: Operative site guarding;Sore Pain Intervention(s): Limited activity within patient's tolerance;Monitored during session;Repositioned;RN gave pain meds during session     Hand Dominance Right   Extremity/Trunk Assessment Upper Extremity Assessment Upper Extremity Assessment:  Overall [facility] for tasks assessed   Lower Extremity Assessment Lower Extremity Assessment: Overall WFL for tasks assessed       Communication Communication Communication: No difficulties   Cognition Arousal/Alertness:  Awake/alert Behavior During Therapy: WFL for tasks assessed/performed Overall Cognitive Status: Within Functional Limits for tasks assessed                                Home Living Family/patient expects to be discharged to:: Private residence Living Arrangements: Spouse/significant other Available Help at Discharge: Family;Available 24 hours/day Type of Home: House Home Access: Stairs to enter [employer] of Steps: 2 Entrance Stairs-Rails: None Home Layout: Two level;Able to live on main level with bedroom/bathroom     Bathroom Shower/Tub: Walk-in shower (built in Freight forwarder)   Bathroom Toilet: [place] - single point;Bedside commode;Shower seat          Prior Functioning/Environment Level of Independence: Independent             OT Diagnosis: Generalized weakness;Acute pain   OT Problem List: Decreased range of motion;Pain   OT Treatment/Interventions:      OT Goals(Current goals can be found in the care plan section) Acute Rehab OT Goals Patient Stated Goal: return home OT Goal Formulation: All assessment and education complete, DC therapy                End of Session Equipment Utilized During Treatment: Rolling walker;Back brace Nurse Communication: Mobility status  Activity Tolerance: Patient tolerated treatment well Patient left: in chair;with call bell/phone within reach;with family/visitor present   Time: [id number] OT Time Calculation (min): 22 min Charges:  OT General Charges $OT Visit: 1 Procedure OT Evaluation $OT Eval Moderate Complexity: 1 Procedure G-CodesSimonne Come, [id number] 09/13/2015, 12:25 PM

## 2015-09-14 LAB — GLUCOSE, CAPILLARY
GLUCOSE-CAPILLARY: 129 mg/dL — AB (ref 65–99)
GLUCOSE-CAPILLARY: 137 mg/dL — AB (ref 65–99)
GLUCOSE-CAPILLARY: 179 mg/dL — AB (ref 65–99)
Glucose-Capillary: 127 mg/dL — ABNORMAL HIGH (ref 65–99)
Glucose-Capillary: 153 mg/dL — ABNORMAL HIGH (ref 65–99)
Glucose-Capillary: 148 mg/dL — ABNORMAL HIGH (ref 65–99)

## 2015-09-14 MED ORDER — METHOCARBAMOL 750 MG PO TABS
750.0000 mg | ORAL_TABLET | Freq: Four times a day (QID) | ORAL | Status: DC | PRN
  Administered 2015-09-15 (×2): 750 mg via ORAL
  Filled 2015-09-14 (×2): qty 1

## 2015-09-14 MED ORDER — OXYCODONE-ACETAMINOPHEN 5-325 MG PO TABS: 1.0000 | ORAL_TABLET | ORAL | Status: DC | PRN

## 2015-09-14 NOTE — Discharge Instructions (Signed)
No lifting no bending no twisting no driving a riding [place] [employer] is going home or come back to see Dr. Hal Neer.

## 2015-09-14 NOTE — Progress Notes (Signed)
Pt noted to have some minor swelling to lower back just below Honeycomb dressing, Pt does not c/o unusual pain to area nor upon palpation. Attempted to apply pressure dressing, Pt advised he would prefer to wait until tomorrow A.M. That it felt fine at this time.

## 2015-09-14 NOTE — Progress Notes (Addendum)
Discharge was cancelled per Dr Cyndy Freeze due to inclement weather. Pt and wife were offered transportation via EMS and refused. Pt experiencing nausea, relieved by 4 mg Zofran. Case Management notified of Pt need for hospital bed in  home, order placed. Pt incisions are clean dry intact, small amount of old drainage, slight swelling under mid lower back incision moving toward sacrum. Pt states no increase in discomfort or pain, ambulated and repositioned. MD notified, per MD compression dressing for swelling.

## 2015-09-14 NOTE — Progress Notes (Signed)
Patient ID: AADVIK Navarro, male   DOB: 03/19/1946, 70 y.o.   MRN: [id number] Patient doing well minimal leg pain back pain well-controlled  Strength out of 5 wound clean dry and intact  Discharge home

## 2015-09-14 NOTE — Discharge Summary (Signed)
  Physician Discharge Summary  Patient ID: Darryl Navarro MRN: [id number] DOB/AGE: 10/26/1945 70 y.o.  Admit date: 09/10/2015 Discharge date: 09/14/2015  Admission Diagnoses: Lumbar spondylosis and stenosis  Discharge Diagnoses: Same Active Problems:   Lumbar stenosis with neurogenic claudication   Discharged Condition: good  Hospital Course: Patient admitted hospital underwent decompression stabilization procedure postop patient did very well recovered in the floor on the floor was angling and voiding spontaneously tolerating regular diet stable for discharge home.  Consults: Significant Diagnostic Studies: Treatments: Decompression stabilization lumbar Discharge Exam: Blood pressure 129/65, pulse 64, temperature 98.1 F (36.7 C), temperature source Oral, resp. rate 18, height 5\' 10"  (1.778 m), weight 78.79 kg (173 lb 11.2 oz), SpO2 95 %. Strength 5 out of 5 wound clean dry and intact  Disposition: Home  Discharge Instructions    DME Hospital bed    Complete by:  As directed   The above medical condition requires:  Patient requires the ability to reposition frequently  Head must be elevated greater than:  30 degrees  Bed type:  Semi-electric     For home use only DME 4 wheeled rolling walker with seat    Complete by:  As directed             Medication List    TAKE these medications        amLODipine 5 MG tablet  Commonly known as:  NORVASC  Take 5 mg by mouth daily.     atorvastatin 10 MG tablet  Commonly known as:  LIPITOR  Take 10 mg by mouth daily.     dabigatran 150 MG Caps capsule  Commonly known as:  [name]  TAKE ONE CAPSULE EVERY 12 HOURS     glimepiride 2 MG tablet  Commonly known as:  AMARYL  Take 2 mg by mouth daily with breakfast.     ibuprofen 200 MG tablet  Commonly known as:  ADVIL,MOTRIN  Take 800 mg by mouth every 6 (six) hours as needed. For pain     lisinopril 20 MG tablet  Commonly known as:  PRINIVIL,ZESTRIL  Take 20 mg by mouth  daily.     metFORMIN 1000 MG tablet  Commonly known as:  GLUCOPHAGE  Take 1,000 mg by mouth 2 (two) times daily with a meal.     metoprolol 50 MG tablet  Commonly known as:  LOPRESSOR  Take 50 mg by mouth daily.     oxyCODONE-acetaminophen 5-325 MG tablet  Commonly known as:  PERCOCET/ROXICET  Take 1-2 tablets by mouth every 4 (four) hours as needed for moderate pain.           Follow-up Information    Follow up with Faythe Ghee, MD.   Specialty:  Neurosurgery   Contact information:   [address] [place] [address] [phone]       Signed: Elaina Hoops 09/14/2015, 7:42 AM

## 2015-09-14 NOTE — Progress Notes (Signed)
Physical Therapy Treatment Patient Details Name: Darryl Navarro MRN: [id number] DOB: 1946-08-10 Today's Date: 09/14/2015    History of Present Illness Pt s/p two part L2-L5 fusion with rt anterolateral approach 1/3 and posterior approach 1/5. PMH - Neuropathy, DM, CVA, HTN    PT Comments    Pt progressing towards physical therapy goals. Was able to perform transfers and ambulation with gross supervision for safety, however required hands-on assist for stair training. Pt was educated on back precautions, brace management, and general safety awareness with mobility. Will continue to follow and progress as able per POC.   Follow Up Recommendations  No PT follow up     Equipment Recommendations  None recommended by PT    Recommendations for Other Services       Precautions / Restrictions Precautions Precautions: Back Required Braces or Orthoses: Spinal Brace Spinal Brace: Lumbar corset;Applied in sitting position Restrictions Weight Bearing Restrictions: No    Mobility  Bed Mobility Overal bed mobility: Needs Assistance Bed Mobility: Rolling;Sidelying to Sit Rolling: Modified independent (Device/Increase time) Sidelying to sit: Supervision       General bed mobility comments: Appears painful. HOB flat and railings lowered to simulate home environment. Pt was able to transition to EOB without assistance.   Transfers Overall transfer level: Needs assistance Equipment used: Rolling walker (2 wheeled) Transfers: Sit to/from Stand Sit to Stand: Supervision         General transfer comment: VC's for hand placement on seated surface for safety. Pt was able to power-up to full standing position without physical assist, however appears unsteady when reaching from the bed to the walker.   Ambulation/Gait Ambulation/Gait assistance: Supervision Ambulation Distance (Feet): 200 Feet Assistive device: Rolling walker (2 wheeled) Gait Pattern/deviations: Step-through  pattern;Decreased stride length;Trunk flexed Gait velocity: Decreased overall however appears to be rushing at first Gait velocity interpretation: Below normal speed for age/gender General Gait Details: Multimodal cues for improved posture. Pt able to achieve in static standing but was unable to maintain during ambulation.    Stairs Stairs: Yes Stairs assistance: Min guard Stair Management: No rails;Step to pattern;Backwards;With walker Number of Stairs: 4 (2 stairs x2) General stair comments: Pt states he does not have rails to enter home, and was instructed in RW use. Unable to complete with UE support. Practiced x2 on large steps in rehab gym. Wife was educated on safety and assist required upon return to the room.   Wheelchair Mobility    Modified Rankin (Stroke Patients Only)       Balance Overall balance assessment: Needs assistance Sitting-balance support: Feet supported;No upper extremity supported Sitting balance-Leahy Scale: Good     Standing balance support: No upper extremity supported;During functional activity Standing balance-Leahy Scale: Fair                      Cognition Arousal/Alertness: Awake/alert Behavior During Therapy: WFL for tasks assessed/performed Overall Cognitive Status: Within Functional Limits for tasks assessed                      Exercises      General Comments        Pertinent Vitals/Pain Pain Assessment: Faces Faces Pain Scale: Hurts even more Pain Location: Back Pain Descriptors / Indicators: Operative site guarding;Discomfort Pain Intervention(s): Limited activity within patient's tolerance;Monitored during session;Repositioned    Home Living  Prior Function            PT Goals (current goals can now be found in the care plan section) Acute Rehab PT Goals Patient Stated Goal: return home today PT Goal Formulation: With patient Time For Goal Achievement: 09/20/15 Potential  to Achieve Goals: Good Progress towards PT goals: Progressing toward goals    Frequency  Min 5X/week    PT Plan Current plan remains appropriate    Co-evaluation             End of Session Equipment Utilized During Treatment: Back brace Activity Tolerance: Patient tolerated treatment well Patient left: in bed;with call bell/phone within reach     Time: 0818-0840 PT Time Calculation (min) (ACUTE ONLY): 22 min  Charges:  $Gait Training: 8-22 mins                    G Codes:      Rolinda Roan 09-27-15, 9:42 AM  Rolinda Roan, PT, DPT Acute Rehabilitation Services Pager: [phone]

## 2015-09-15 MED ORDER — METHOCARBAMOL 750 MG PO TABS: 750.0000 mg | ORAL_TABLET | Freq: Four times a day (QID) | ORAL | Status: DC | PRN

## 2015-09-15 NOTE — Care Management (Signed)
Case manager received request to order a rollator for patient. CM contacted Merry Proud, DME liaison with [facility] and placed order. Patient's hospital bed will not be delivered to the home until Monday due to the weather. Case manager notified patient's RN of this.  Gilford Rile will be delivered to patient's room.

## 2015-09-15 NOTE — Progress Notes (Signed)
Doing well AVSS Moving lower extremities well Incision c/d/i Ready for discharge

## 2015-09-17 ENCOUNTER — Telehealth: Payer: Self-pay | Admitting: Neurology

## 2015-09-17 ENCOUNTER — Encounter (HOSPITAL_COMMUNITY): Payer: Self-pay | Admitting: Neurosurgery

## 2015-09-17 NOTE — Telephone Encounter (Signed)
LFt message with family member for patient to call back about changing medications.

## 2015-09-17 NOTE — Telephone Encounter (Signed)
Pt called said his [name] 150 mg went from Tier 3 at $45/mth to tier 2 at $95/mth.. Eliquis and Xerelto fall under Tier 3 plan at $45/mth. He is inquiring if Dr Leonie Man prescribes either of these and if RX can be changed.

## 2015-09-19 ENCOUNTER — Other Ambulatory Visit: Payer: Self-pay

## 2015-09-19 DIAGNOSIS — I639 Cerebral infarction, unspecified: Secondary | ICD-10-CM

## 2015-09-19 MED ORDER — APIXABAN 5 MG PO TABS
5.0000 mg | ORAL_TABLET | Freq: Two times a day (BID) | ORAL | Status: DC
Start: 2015-09-19 — End: 2016-05-04

## 2015-09-19 NOTE — Telephone Encounter (Signed)
Patient is returning your call.  

## 2015-09-19 NOTE — Telephone Encounter (Signed)
Rn call patient back about if Dr.Sethi can change his [name] to another medication. Pt stated the [name] is now 95.00 monthly co payment. Pt would like to see if he can have eliquis or Xarelto. Message sent to Dr. Leonie Man.

## 2015-09-19 NOTE — Telephone Encounter (Signed)
Ok to change to eliquis 5 mg twice daily

## 2015-09-20 NOTE — Telephone Encounter (Signed)
Rn call patient to let him know that the Eliquis medication was sent to [facility]. Pt verbalized understanding and will pick up the medication. Pt will not be taking the [name] due to the co payment being high.

## 2015-11-14 ENCOUNTER — Encounter (HOSPITAL_BASED_OUTPATIENT_CLINIC_OR_DEPARTMENT_OTHER): Payer: Medicare Other | Attending: Internal Medicine

## 2015-11-14 DIAGNOSIS — Z7984 Long term (current) use of oral hypoglycemic drugs: Secondary | ICD-10-CM | POA: Diagnosis not present

## 2015-11-14 DIAGNOSIS — A4902 Methicillin resistant Staphylococcus aureus infection, unspecified site: Secondary | ICD-10-CM | POA: Insufficient documentation

## 2015-11-14 DIAGNOSIS — Z8673 Personal history of transient ischemic attack (TIA), and cerebral infarction without residual deficits: Secondary | ICD-10-CM | POA: Insufficient documentation

## 2015-11-14 DIAGNOSIS — I4891 Unspecified atrial fibrillation: Secondary | ICD-10-CM | POA: Diagnosis not present

## 2015-11-14 DIAGNOSIS — T8189XA Other complications of procedures, not elsewhere classified, initial encounter: Secondary | ICD-10-CM | POA: Diagnosis present

## 2015-11-14 DIAGNOSIS — E1140 Type 2 diabetes mellitus with diabetic neuropathy, unspecified: Secondary | ICD-10-CM | POA: Insufficient documentation

## 2015-11-14 DIAGNOSIS — M19042 Primary osteoarthritis, left hand: Secondary | ICD-10-CM | POA: Diagnosis not present

## 2015-11-14 DIAGNOSIS — M19041 Primary osteoarthritis, right hand: Secondary | ICD-10-CM | POA: Diagnosis not present

## 2015-11-14 DIAGNOSIS — Z87891 Personal history of nicotine dependence: Secondary | ICD-10-CM | POA: Insufficient documentation

## 2015-11-14 DIAGNOSIS — Y838 Other surgical procedures as the cause of abnormal reaction of the patient, or of later complication, without mention of misadventure at the time of the procedure: Secondary | ICD-10-CM | POA: Insufficient documentation

## 2015-11-21 DIAGNOSIS — T8189XA Other complications of procedures, not elsewhere classified, initial encounter: Secondary | ICD-10-CM | POA: Diagnosis not present

## 2015-11-28 DIAGNOSIS — T8189XA Other complications of procedures, not elsewhere classified, initial encounter: Secondary | ICD-10-CM | POA: Diagnosis not present

## 2015-12-05 DIAGNOSIS — T8189XA Other complications of procedures, not elsewhere classified, initial encounter: Secondary | ICD-10-CM | POA: Diagnosis not present

## 2015-12-17 ENCOUNTER — Ambulatory Visit: Payer: No Typology Code available for payment source | Admitting: Infectious Disease

## 2015-12-19 ENCOUNTER — Encounter (HOSPITAL_BASED_OUTPATIENT_CLINIC_OR_DEPARTMENT_OTHER): Payer: Medicare Other | Attending: Internal Medicine

## 2015-12-19 DIAGNOSIS — E1140 Type 2 diabetes mellitus with diabetic neuropathy, unspecified: Secondary | ICD-10-CM | POA: Diagnosis not present

## 2015-12-19 DIAGNOSIS — I10 Essential (primary) hypertension: Secondary | ICD-10-CM | POA: Diagnosis not present

## 2015-12-19 DIAGNOSIS — T8189XA Other complications of procedures, not elsewhere classified, initial encounter: Secondary | ICD-10-CM | POA: Insufficient documentation

## 2015-12-19 DIAGNOSIS — Y838 Other surgical procedures as the cause of abnormal reaction of the patient, or of later complication, without mention of misadventure at the time of the procedure: Secondary | ICD-10-CM | POA: Diagnosis not present

## 2015-12-19 DIAGNOSIS — L98422 Non-pressure chronic ulcer of back with fat layer exposed: Secondary | ICD-10-CM | POA: Insufficient documentation

## 2015-12-19 DIAGNOSIS — E11622 Type 2 diabetes mellitus with other skin ulcer: Secondary | ICD-10-CM | POA: Insufficient documentation

## 2015-12-23 ENCOUNTER — Ambulatory Visit
Admission: RE | Admit: 2015-12-23 | Discharge: 2015-12-23 | Disposition: A | Discharge status: Home / Self Care | Payer: No Typology Code available for payment source | Source: Ambulatory Visit | Attending: Infectious Disease | Admitting: Infectious Disease

## 2015-12-23 ENCOUNTER — Encounter: Payer: Self-pay | Admitting: Infectious Disease

## 2015-12-23 ENCOUNTER — Ambulatory Visit: Payer: No Typology Code available for payment source | Attending: Infectious Disease | Admitting: Infectious Disease

## 2015-12-23 DIAGNOSIS — B9562 Methicillin resistant Staphylococcus aureus infection as the cause of diseases classified elsewhere: Secondary | ICD-10-CM | POA: Insufficient documentation

## 2015-12-23 DIAGNOSIS — E7801 Familial hypercholesterolemia: Secondary | ICD-10-CM | POA: Insufficient documentation

## 2015-12-23 DIAGNOSIS — Z7902 Long term (current) use of antithrombotics/antiplatelets: Secondary | ICD-10-CM | POA: Insufficient documentation

## 2015-12-23 DIAGNOSIS — L98423 Non-pressure chronic ulcer of back with necrosis of muscle: Secondary | ICD-10-CM

## 2015-12-23 DIAGNOSIS — L98422 Non-pressure chronic ulcer of back with fat layer exposed: Secondary | ICD-10-CM | POA: Insufficient documentation

## 2015-12-23 DIAGNOSIS — E11622 Type 2 diabetes mellitus with other skin ulcer: Secondary | ICD-10-CM | POA: Insufficient documentation

## 2015-12-23 DIAGNOSIS — A4902 Methicillin resistant Staphylococcus aureus infection, unspecified site: Secondary | ICD-10-CM

## 2015-12-23 DIAGNOSIS — M47816 Spondylosis without myelopathy or radiculopathy, lumbar region: Secondary | ICD-10-CM | POA: Insufficient documentation

## 2015-12-23 DIAGNOSIS — I10 Essential (primary) hypertension: Secondary | ICD-10-CM | POA: Insufficient documentation

## 2015-12-23 LAB — CBC AND DIFFERENTIAL
Basophils Absolute Automated: 0.01 10*3/uL (ref 0.00–0.20)
Basophils Automated: 0 %
Eosinophils Absolute Automated: 0.11 10*3/uL (ref 0.00–0.70)
Eosinophils Automated: 1 %
Hematocrit: 36.9 % — ABNORMAL LOW (ref 42.0–52.0)
Hgb: 11.7 g/dL — ABNORMAL LOW (ref 13.0–17.0)
Immature Granulocytes Absolute: 0.02 10*3/uL
Immature Granulocytes: 0 %
Lymphocytes Absolute Automated: 1.69 10*3/uL (ref 0.50–4.40)
Lymphocytes Automated: 21 %
MCH: 29.2 pg (ref 28.0–32.0)
MCHC: 31.7 g/dL — ABNORMAL LOW (ref 32.0–36.0)
MCV: 92 fL (ref 80.0–100.0)
MPV: 12.1 fL (ref 9.4–12.3)
Monocytes Absolute Automated: 0.82 10*3/uL (ref 0.00–1.20)
Monocytes: 10 %
Neutrophils Absolute: 5.27 10*3/uL (ref 1.80–8.10)
Neutrophils: 66 %
Nucleated RBC: 0 /100 WBC (ref 0–1)
Platelets: 133 10*3/uL — ABNORMAL LOW (ref 140–400)
RBC: 4.01 10*6/uL — ABNORMAL LOW (ref 4.70–6.00)
RDW: 15 % (ref 12–15)
WBC: 7.92 10*3/uL (ref 3.50–10.80)

## 2015-12-23 LAB — COMPREHENSIVE METABOLIC PANEL
ALT: 20 U/L (ref 0–55)
AST (SGOT): 21 U/L (ref 5–34)
Albumin/Globulin Ratio: 1.4 (ref 0.9–2.2)
Albumin: 3.9 g/dL (ref 3.5–5.0)
Alkaline Phosphatase: 79 U/L (ref 38–106)
BUN: 27 mg/dL (ref 9.0–28.0)
Bilirubin, Total: 0.3 mg/dL (ref 0.1–1.2)
CO2: 22 mEq/L (ref 21–30)
Calcium: 9.5 mg/dL (ref 7.9–10.2)
Chloride: 109 mEq/L (ref 100–111)
Creatinine: 1.2 mg/dL (ref 0.5–1.5)
Globulin: 2.7 g/dL (ref 2.0–3.7)
Glucose: 109 mg/dL — ABNORMAL HIGH (ref 70–100)
Potassium: 5 mEq/L (ref 3.5–5.3)
Protein, Total: 6.6 g/dL (ref 6.0–8.3)
Sodium: 142 mEq/L (ref 135–146)

## 2015-12-23 LAB — HEMOLYSIS INDEX: Hemolysis Index: 6 (ref 0–18)

## 2015-12-23 LAB — SEDIMENTATION RATE: Sed Rate: 12 mm/Hr (ref 0–15)

## 2015-12-23 LAB — C-REACTIVE PROTEIN: C-Reactive Protein: 0.3 mg/dL (ref 0.0–0.8)

## 2015-12-23 LAB — GFR: EGFR: 59.5

## 2015-12-23 NOTE — Progress Notes (Signed)
See [employer] notes in media

## 2015-12-24 MED ORDER — SULFAMETHOXAZOLE-TRIMETHOPRIM 800-160 MG PO TABS
1.0000 | ORAL_TABLET | Freq: Two times a day (BID) | ORAL | Status: AC
Start: 2015-12-24 — End: 2016-01-07

## 2015-12-24 NOTE — Addendum Note (Signed)
Addended by: Lambert Mody on: 12/24/2015 04:49 PM     Modules accepted: Orders

## 2016-01-01 ENCOUNTER — Ambulatory Visit (INDEPENDENT_AMBULATORY_CARE_PROVIDER_SITE_OTHER): Payer: Medicare Other | Admitting: Ophthalmology

## 2016-01-10 ENCOUNTER — Ambulatory Visit: Payer: No Typology Code available for payment source

## 2016-01-13 ENCOUNTER — Ambulatory Visit: Payer: No Typology Code available for payment source | Attending: Family

## 2016-01-13 DIAGNOSIS — L98423 Non-pressure chronic ulcer of back with necrosis of muscle: Secondary | ICD-10-CM

## 2016-01-13 DIAGNOSIS — L98422 Non-pressure chronic ulcer of back with fat layer exposed: Secondary | ICD-10-CM | POA: Insufficient documentation

## 2016-01-13 DIAGNOSIS — Z7902 Long term (current) use of antithrombotics/antiplatelets: Secondary | ICD-10-CM | POA: Insufficient documentation

## 2016-01-13 DIAGNOSIS — Z8614 Personal history of Methicillin resistant Staphylococcus aureus infection: Secondary | ICD-10-CM | POA: Insufficient documentation

## 2016-01-13 NOTE — Progress Notes (Signed)
See scanned [employer] documentation.

## 2016-01-20 ENCOUNTER — Encounter: Payer: Self-pay | Admitting: Family

## 2016-01-20 ENCOUNTER — Ambulatory Visit: Payer: No Typology Code available for payment source | Attending: Family | Admitting: Family

## 2016-01-20 DIAGNOSIS — Z8614 Personal history of Methicillin resistant Staphylococcus aureus infection: Secondary | ICD-10-CM | POA: Insufficient documentation

## 2016-01-20 DIAGNOSIS — I10 Essential (primary) hypertension: Secondary | ICD-10-CM | POA: Insufficient documentation

## 2016-01-20 DIAGNOSIS — L98423 Non-pressure chronic ulcer of back with necrosis of muscle: Secondary | ICD-10-CM

## 2016-01-20 DIAGNOSIS — L98422 Non-pressure chronic ulcer of back with fat layer exposed: Secondary | ICD-10-CM | POA: Insufficient documentation

## 2016-01-20 DIAGNOSIS — E7801 Familial hypercholesterolemia: Secondary | ICD-10-CM | POA: Insufficient documentation

## 2016-01-20 NOTE — Progress Notes (Signed)
See [employer] notes in media

## 2016-02-04 ENCOUNTER — Encounter: Payer: Self-pay | Admitting: Family

## 2016-02-04 ENCOUNTER — Ambulatory Visit: Payer: No Typology Code available for payment source | Attending: Family | Admitting: Family

## 2016-02-04 DIAGNOSIS — L98422 Non-pressure chronic ulcer of back with fat layer exposed: Secondary | ICD-10-CM | POA: Insufficient documentation

## 2016-02-04 DIAGNOSIS — I10 Essential (primary) hypertension: Secondary | ICD-10-CM | POA: Insufficient documentation

## 2016-02-04 DIAGNOSIS — Z8614 Personal history of Methicillin resistant Staphylococcus aureus infection: Secondary | ICD-10-CM | POA: Insufficient documentation

## 2016-02-04 DIAGNOSIS — E7801 Familial hypercholesterolemia: Secondary | ICD-10-CM | POA: Insufficient documentation

## 2016-02-04 DIAGNOSIS — L98423 Non-pressure chronic ulcer of back with necrosis of muscle: Secondary | ICD-10-CM

## 2016-02-04 NOTE — Progress Notes (Signed)
See scanned [employer] documentation.

## 2016-02-18 ENCOUNTER — Ambulatory Visit: Payer: No Typology Code available for payment source | Attending: Family

## 2016-02-18 DIAGNOSIS — E11622 Type 2 diabetes mellitus with other skin ulcer: Secondary | ICD-10-CM | POA: Insufficient documentation

## 2016-02-18 DIAGNOSIS — I10 Essential (primary) hypertension: Secondary | ICD-10-CM | POA: Insufficient documentation

## 2016-02-18 DIAGNOSIS — L98423 Non-pressure chronic ulcer of back with necrosis of muscle: Secondary | ICD-10-CM

## 2016-02-18 DIAGNOSIS — L98422 Non-pressure chronic ulcer of back with fat layer exposed: Secondary | ICD-10-CM | POA: Insufficient documentation

## 2016-02-18 DIAGNOSIS — B9562 Methicillin resistant Staphylococcus aureus infection as the cause of diseases classified elsewhere: Secondary | ICD-10-CM | POA: Insufficient documentation

## 2016-02-18 DIAGNOSIS — E7801 Familial hypercholesterolemia: Secondary | ICD-10-CM | POA: Insufficient documentation

## 2016-02-18 NOTE — Progress Notes (Signed)
See scanned [employer] documentation.

## 2016-03-03 ENCOUNTER — Encounter: Payer: Self-pay | Admitting: Family

## 2016-03-03 ENCOUNTER — Ambulatory Visit: Payer: No Typology Code available for payment source | Attending: Family | Admitting: Family

## 2016-03-03 DIAGNOSIS — L98423 Non-pressure chronic ulcer of back with necrosis of muscle: Secondary | ICD-10-CM

## 2016-03-03 DIAGNOSIS — Z872 Personal history of diseases of the skin and subcutaneous tissue: Secondary | ICD-10-CM | POA: Insufficient documentation

## 2016-03-03 DIAGNOSIS — I10 Essential (primary) hypertension: Secondary | ICD-10-CM | POA: Insufficient documentation

## 2016-03-03 DIAGNOSIS — Z8614 Personal history of Methicillin resistant Staphylococcus aureus infection: Secondary | ICD-10-CM | POA: Insufficient documentation

## 2016-03-03 DIAGNOSIS — E7801 Familial hypercholesterolemia: Secondary | ICD-10-CM | POA: Insufficient documentation

## 2016-03-03 DIAGNOSIS — Z09 Encounter for follow-up examination after completed treatment for conditions other than malignant neoplasm: Secondary | ICD-10-CM | POA: Insufficient documentation

## 2016-03-03 NOTE — Progress Notes (Signed)
See [employer] notes in media

## 2016-04-07 ENCOUNTER — Ambulatory Visit: Payer: Medicare Other | Admitting: Adult Health

## 2016-04-21 ENCOUNTER — Ambulatory Visit: Payer: Medicare Other | Admitting: Adult Health

## 2016-04-27 DIAGNOSIS — G629 Polyneuropathy, unspecified: Secondary | ICD-10-CM | POA: Insufficient documentation

## 2016-04-27 DIAGNOSIS — Z0000 Encounter for general adult medical examination without abnormal findings: Secondary | ICD-10-CM | POA: Insufficient documentation

## 2016-04-27 HISTORY — DX: Polyneuropathy, unspecified: G62.9

## 2016-05-04 ENCOUNTER — Ambulatory Visit (INDEPENDENT_AMBULATORY_CARE_PROVIDER_SITE_OTHER): Payer: Medicare Other | Admitting: Adult Health

## 2016-05-04 ENCOUNTER — Encounter: Payer: Self-pay | Admitting: Adult Health

## 2016-05-04 VITALS — BP 169/72 | HR 44 | Ht 70.0 in | Wt 177.8 lb

## 2016-05-04 DIAGNOSIS — Z8673 Personal history of transient ischemic attack (TIA), and cerebral infarction without residual deficits: Secondary | ICD-10-CM

## 2016-05-04 MED ORDER — APIXABAN 5 MG PO TABS: 5.0000 mg | ORAL_TABLET | Freq: Two times a day (BID) | ORAL | 5 refills | Status: DC

## 2016-05-04 NOTE — Patient Instructions (Signed)
Blood Pressure <130/90 Cholesterol LDL <100 Hemoglobin A1c <6.5 If your symptoms worsen or you develop new symptoms please let us know.
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
## 2016-05-07 ENCOUNTER — Telehealth: Payer: Self-pay | Admitting: *Deleted

## 2016-05-07 NOTE — Telephone Encounter (Signed)
Approval for eliquis [id number]  Good thru 09-06-16. [place] [id number]. [phone].  Spoke to [id number] went thru. 05-07-16 ssy.  [facility].

## 2016-05-07 NOTE — Telephone Encounter (Signed)
PA initiated for Eliquis.

## 2016-05-07 NOTE — Telephone Encounter (Signed)
Pt release faxed to [facility] requesting labs.

## 2016-05-07 NOTE — Progress Notes (Signed)
I agree with the above plan 

## 2016-05-25 ENCOUNTER — Telehealth: Payer: Self-pay | Admitting: Adult Health

## 2016-05-25 NOTE — Telephone Encounter (Signed)
I received the patient's lab work from his primary care provider. His glucose was high at 138, BUN 28, HDL 36 and hemoglobin A1c 6.5

## 2016-08-05 DIAGNOSIS — N401 Enlarged prostate with lower urinary tract symptoms: Secondary | ICD-10-CM

## 2016-08-05 DIAGNOSIS — N138 Other obstructive and reflux uropathy: Secondary | ICD-10-CM

## 2016-08-05 HISTORY — DX: Benign prostatic hyperplasia with lower urinary tract symptoms: N40.1

## 2016-08-05 HISTORY — DX: Other obstructive and reflux uropathy: N13.8

## 2016-10-23 ENCOUNTER — Other Ambulatory Visit: Payer: Self-pay | Admitting: Adult Health

## 2016-10-23 DIAGNOSIS — Z8673 Personal history of transient ischemic attack (TIA), and cerebral infarction without residual deficits: Secondary | ICD-10-CM

## 2017-03-01 ENCOUNTER — Other Ambulatory Visit (INDEPENDENT_AMBULATORY_CARE_PROVIDER_SITE_OTHER): Payer: Self-pay | Admitting: Family Medicine

## 2017-05-31 DIAGNOSIS — M545 Low back pain, unspecified: Secondary | ICD-10-CM | POA: Insufficient documentation

## 2017-08-16 ENCOUNTER — Ambulatory Visit (INDEPENDENT_AMBULATORY_CARE_PROVIDER_SITE_OTHER): Payer: Self-pay | Admitting: Cardiovascular Disease

## 2017-09-07 HISTORY — PX: CERVICAL SPINE SURGERY: SHX589

## 2017-09-29 ENCOUNTER — Other Ambulatory Visit (INDEPENDENT_AMBULATORY_CARE_PROVIDER_SITE_OTHER): Payer: Self-pay | Admitting: Family Medicine

## 2017-11-29 ENCOUNTER — Other Ambulatory Visit (INDEPENDENT_AMBULATORY_CARE_PROVIDER_SITE_OTHER): Payer: Self-pay | Admitting: Family Medicine

## 2017-11-29 DIAGNOSIS — E782 Mixed hyperlipidemia: Secondary | ICD-10-CM | POA: Insufficient documentation

## 2017-11-29 DIAGNOSIS — E785 Hyperlipidemia, unspecified: Secondary | ICD-10-CM | POA: Insufficient documentation

## 2018-04-25 DIAGNOSIS — D649 Anemia, unspecified: Secondary | ICD-10-CM | POA: Insufficient documentation

## 2018-05-01 ENCOUNTER — Other Ambulatory Visit (INDEPENDENT_AMBULATORY_CARE_PROVIDER_SITE_OTHER): Payer: Self-pay | Admitting: Family Medicine

## 2018-05-02 ENCOUNTER — Other Ambulatory Visit (INDEPENDENT_AMBULATORY_CARE_PROVIDER_SITE_OTHER): Payer: Self-pay | Admitting: Family Medicine

## 2018-05-12 DIAGNOSIS — R200 Anesthesia of skin: Secondary | ICD-10-CM | POA: Insufficient documentation

## 2018-11-14 DIAGNOSIS — I739 Peripheral vascular disease, unspecified: Secondary | ICD-10-CM | POA: Insufficient documentation

## 2018-11-14 HISTORY — DX: Peripheral vascular disease, unspecified: I73.9

## 2018-11-23 ENCOUNTER — Other Ambulatory Visit (INDEPENDENT_AMBULATORY_CARE_PROVIDER_SITE_OTHER): Payer: Self-pay | Admitting: Family Medicine

## 2019-02-22 LAB — HEMOGLOBIN A1C: Hemoglobin A1C: 6.4 % — ABNORMAL HIGH (ref 4.8–5.6)

## 2019-02-22 LAB — LIPID PANEL
Cholesterol / HDL Ratio: 3.4 ratio (ref 0.0–5.0)
Cholesterol: 134 mg/dL (ref 100–199)
HDL: 39 mg/dL — ABNORMAL LOW (ref >39)
LDL Calculated: 69 mg/dL (ref 0–99)
Triglycerides: 128 mg/dL (ref 0–149)
VLDL Calculated: 26 mg/dL (ref 5–40)

## 2019-02-22 LAB — COMPREHENSIVE METABOLIC PANEL
ALT: 24 IU/L (ref 0–44)
AST (SGOT): 28 IU/L (ref 0–40)
Albumin/Globulin Ratio: 2.1 (ref 1.2–2.2)
Albumin: 4.6 g/dL (ref 3.5–4.8)
Alkaline Phosphatase: 75 IU/L (ref 39–117)
BUN / Creatinine Ratio: 23 (ref 10–24)
BUN: 27 mg/dL (ref 8–27)
Bilirubin, Total: 0.4 mg/dL (ref 0.0–1.2)
CO2: 22 mmol/L (ref 20–29)
Calcium: 9.7 mg/dL (ref 8.6–10.2)
Chloride: 106 mmol/L (ref 96–106)
Creatinine: 1.15 mg/dL (ref 0.76–1.27)
EGFR: 63 mL/min/{1.73_m2} (ref >59)
EGFR: 73 mL/min/{1.73_m2} (ref >59)
Globulin, Total: 2.2 g/dL (ref 1.5–4.5)
Glucose: 140 mg/dL — ABNORMAL HIGH (ref 65–99)
Potassium: 4.8 mmol/L (ref 3.5–5.2)
Protein, Total: 6.8 g/dL (ref 6.0–8.5)
Sodium: 142 mmol/L (ref 134–144)

## 2019-02-22 LAB — URINE CULTURE

## 2019-02-22 LAB — PSA: Prostate Specific Antigen, Total: 0.8 ng/mL (ref 0.0–4.0)

## 2019-04-12 LAB — CBC AND DIFFERENTIAL
Baso(Absolute): 0 10*3/uL (ref 0.0–0.2)
Baso(Absolute): 0 10*3/uL (ref 0.0–0.2)
Basos: 0 %
Basos: 0 %
Eos: 1 %
Eos: 2 %
Eosinophils Absolute: 0.1 10*3/uL (ref 0.0–0.4)
Eosinophils Absolute: 0.2 10*3/uL (ref 0.0–0.4)
Hematocrit: 35.8 % — ABNORMAL LOW (ref 37.5–51.0)
Hematocrit: 36.6 % — ABNORMAL LOW (ref 37.5–51.0)
Hemoglobin: 12.4 g/dL — ABNORMAL LOW (ref 13.0–17.7)
Hemoglobin: 11.7 g/dL — ABNORMAL LOW (ref 13.0–17.7)
Immature Granulocytes Absolute: 0 10*3/uL (ref 0.0–0.1)
Immature Granulocytes Absolute: 0 10*3/uL (ref 0.0–0.1)
Immature Granulocytes: 0 %
Immature Granulocytes: 0 %
Lymphocytes Absolute: 2 10*3/uL (ref 0.7–3.1)
Lymphocytes Absolute: 2.1 10*3/uL (ref 0.7–3.1)
Lymphocytes: 25 %
Lymphocytes: 31 %
MCH: 31.2 pg (ref 26.6–33.0)
MCH: 27.3 pg (ref 26.6–33.0)
MCHC: 34.6 g/dL (ref 31.5–35.7)
MCHC: 32 g/dL (ref 31.5–35.7)
MCV: 90 fL (ref 79–97)
MCV: 86 fL (ref 79–97)
Monocytes Absolute: 0.6 10*3/uL (ref 0.1–0.9)
Monocytes Absolute: 0.6 10*3/uL (ref 0.1–0.9)
Monocytes: 8 %
Monocytes: 9 %
Neutrophils Absolute: 5 10*3/uL (ref 1.4–7.0)
Neutrophils Absolute: 3.9 10*3/uL (ref 1.4–7.0)
Neutrophils: 66 %
Neutrophils: 58 %
Platelets: 206 10*3/uL (ref 150–450)
Platelets: 159 10*3/uL (ref 150–450)
RBC: 3.98 x10E6/uL — ABNORMAL LOW (ref 4.14–5.80)
RBC: 4.28 x10E6/uL (ref 4.14–5.80)
RDW: 12.8 % (ref 11.6–15.4)
RDW: 16.5 % — ABNORMAL HIGH (ref 12.3–15.4)
WBC: 7.7 10*3/uL (ref 3.4–10.8)
WBC: 6.8 10*3/uL (ref 3.4–10.8)

## 2019-04-12 LAB — COMPREHENSIVE METABOLIC PANEL
ALT: 21 IU/L (ref 0–44)
AST (SGOT): 27 IU/L (ref 0–40)
Albumin/Globulin Ratio: 1.9 (ref 1.2–2.2)
Albumin: 4.6 g/dL (ref 3.5–4.8)
Alkaline Phosphatase: 83 IU/L (ref 39–117)
BUN / Creatinine Ratio: 22 (ref 10–24)
BUN: 27 mg/dL (ref 8–27)
Bilirubin, Total: 0.5 mg/dL (ref 0.0–1.2)
CO2: 21 mmol/L (ref 20–29)
Calcium: 9.9 mg/dL (ref 8.6–10.2)
Chloride: 106 mmol/L (ref 96–106)
Creatinine: 1.21 mg/dL (ref 0.76–1.27)
EGFR: 59 mL/min/{1.73_m2} — ABNORMAL LOW (ref >59)
EGFR: 68 mL/min/{1.73_m2} (ref >59)
Globulin, Total: 2.4 g/dL (ref 1.5–4.5)
Glucose: 108 mg/dL — ABNORMAL HIGH (ref 65–99)
Potassium: 5.3 mmol/L — ABNORMAL HIGH (ref 3.5–5.2)
Protein, Total: 7 g/dL (ref 6.0–8.5)
Sodium: 141 mmol/L (ref 134–144)

## 2019-04-12 LAB — MICROALBUMIN, RANDOM URINE
Creatinine, UR: 41.3 mg/dL
Creatinine, UR: 112.6 mg/dL
Microalb/Crt. Ratio: 15 mg/g creat (ref 0–29)
Microalb/Crt. Ratio: 5.2 mg/g creat (ref 0.0–30.0)
Microalbumin, UR: 6 ug/mL
Microalbumin, UR: 5.8 ug/mL

## 2019-04-12 LAB — BASIC METABOLIC PANEL
BUN / Creatinine Ratio: 19 (ref 10–24)
BUN: 20 mg/dL (ref 8–27)
CO2: 22 mmol/L (ref 20–29)
Calcium: 9.8 mg/dL (ref 8.6–10.2)
Chloride: 98 mmol/L (ref 96–106)
Creatinine: 1.07 mg/dL (ref 0.76–1.27)
EGFR: 68 mL/min/{1.73_m2} (ref >59)
EGFR: 79 mL/min/{1.73_m2} (ref >59)
Glucose: 138 mg/dL — ABNORMAL HIGH (ref 65–99)
Potassium: 4.5 mmol/L (ref 3.5–5.2)
Sodium: 140 mmol/L (ref 134–144)

## 2019-04-12 LAB — LIPID PANEL
Cholesterol / HDL Ratio: 3.1 ratio (ref 0.0–5.0)
Cholesterol / HDL Ratio: 3.8 ratio (ref 0.0–5.0)
Cholesterol: 151 mg/dL (ref 100–199)
Cholesterol: 138 mg/dL (ref 100–199)
HDL: 48 mg/dL (ref >39)
HDL: 36 mg/dL — ABNORMAL LOW (ref >39)
LDL Calculated: 81 mg/dL (ref 0–99)
LDL Calculated: 71 mg/dL (ref 0–99)
Triglycerides: 110 mg/dL (ref 0–149)
Triglycerides: 156 mg/dL — ABNORMAL HIGH (ref 0–149)
VLDL Calculated: 22 mg/dL (ref 5–40)
VLDL Calculated: 31 mg/dL (ref 5–40)

## 2019-04-12 LAB — HEPATIC FUNCTION PANEL
ALT: 24 IU/L (ref 0–44)
AST (SGOT): 32 IU/L (ref 0–40)
Albumin: 4.8 g/dL — ABNORMAL HIGH (ref 3.7–4.7)
Alkaline Phosphatase: 80 IU/L (ref 39–117)
Bilirubin Direct: 0.12 mg/dL (ref 0.00–0.40)
Bilirubin, Total: 0.4 mg/dL (ref 0.0–1.2)
Protein, Total: 6.9 g/dL (ref 6.0–8.5)

## 2019-04-12 LAB — FERRITIN
Ferritin: 47 ng/mL (ref 30–400)
Ferritin: 27 ng/mL — ABNORMAL LOW (ref 30–400)

## 2019-04-12 LAB — HEMOGLOBIN A1C
Hemoglobin A1C: 6.1 % — ABNORMAL HIGH (ref 4.8–5.6)
Hemoglobin A1C: 6.7 % — ABNORMAL HIGH (ref 4.8–5.6)

## 2019-04-12 LAB — STOOL OCCULT BLOOD, IMMUNOASSAY, (NOT GUAIAC BASED): Occult Blood, Fecal, IA: NEGATIVE

## 2019-05-31 ENCOUNTER — Encounter (INDEPENDENT_AMBULATORY_CARE_PROVIDER_SITE_OTHER): Payer: Self-pay

## 2019-06-02 ENCOUNTER — Other Ambulatory Visit (INDEPENDENT_AMBULATORY_CARE_PROVIDER_SITE_OTHER): Payer: Self-pay | Admitting: Family Medicine

## 2019-08-29 ENCOUNTER — Other Ambulatory Visit (INDEPENDENT_AMBULATORY_CARE_PROVIDER_SITE_OTHER): Payer: Self-pay | Admitting: Family Medicine

## 2019-08-29 DIAGNOSIS — E1140 Type 2 diabetes mellitus with diabetic neuropathy, unspecified: Secondary | ICD-10-CM

## 2019-08-29 DIAGNOSIS — I10 Essential (primary) hypertension: Secondary | ICD-10-CM

## 2019-08-29 NOTE — Telephone Encounter (Signed)
Please see pended rx #[id number] no refills.   Patient last seen 11/14/2018

## 2019-08-31 MED ORDER — METFORMIN HCL 1000 MG PO TABS
1000.0000 mg | ORAL_TABLET | Freq: Two times a day (BID) | ORAL | 0 refills | Status: DC
Start: 2019-08-31 — End: 2019-11-06

## 2019-08-31 NOTE — Telephone Encounter (Signed)
I have refilled his medications.  He is overdue for follow-up appointment, these get him scheduled.

## 2019-09-04 NOTE — Telephone Encounter (Signed)
lmtcb and schedule

## 2019-10-05 ENCOUNTER — Telehealth (INDEPENDENT_AMBULATORY_CARE_PROVIDER_SITE_OTHER): Payer: Self-pay

## 2019-10-05 NOTE — Telephone Encounter (Signed)
Please schedule patient for VV to discuss exposure and recommendations with provider.

## 2019-10-05 NOTE — Telephone Encounter (Signed)
lmtcb and schedule vv

## 2019-10-05 NOTE — Telephone Encounter (Signed)
Pt is calling stating his daughter tested positive for covid today, pt stated he is not having any symptoms, but would like to inform the dr of this information. Pt would like a callback from his nurse or when the dr has replied on what pt should in this situation. Please advise.

## 2019-10-06 ENCOUNTER — Encounter (INDEPENDENT_AMBULATORY_CARE_PROVIDER_SITE_OTHER): Payer: Self-pay | Admitting: Family Medicine

## 2019-10-10 ENCOUNTER — Telehealth (INDEPENDENT_AMBULATORY_CARE_PROVIDER_SITE_OTHER): Payer: No Typology Code available for payment source | Admitting: Family Medicine

## 2019-10-10 ENCOUNTER — Encounter (INDEPENDENT_AMBULATORY_CARE_PROVIDER_SITE_OTHER): Payer: Self-pay | Admitting: Family Medicine

## 2019-10-10 DIAGNOSIS — Z20822 Contact with and (suspected) exposure to covid-19: Secondary | ICD-10-CM | POA: Insufficient documentation

## 2019-10-10 NOTE — Progress Notes (Signed)
[facility] [place] [facility]                       Date of Virtual Visit: 10/10/2019 8:52 AM        Patient ID: Brian Pena is a 74 y.o. male.  Attending Physician: Staci Acosta, MD       Telemedicine Eligibility:    State Location:  [x]  [place]  []  [place]  []  [place] []  [place]  []  Other:    Physical Location:  [x]  Home  []         []        []          []  Other:    Patient Identity Verification:  [x]  State Issued ID  []  Insurance Eligibility Check  []  Other:    Physical Address Verification: (for 911)  [x]  Yes  []  No    Personal identity shared with patient:  [x]  Yes  []  No    Education on nature of video visit shared with patient:  [x]  Yes  []  No    Emergency plan agreed upon with patient:  [x]  Yes  []  No    If the patient had not had this virtual visit, what would they have done?  []         []         []        []          []  Other:    Visit terminated since not appropriate for virtual care:  [x]  N/A  []  Reason:         Chief Complaint:    Chief Complaint   Patient presents with   . Covid-19 Screening     exposed                HPI:    67 yom presents today for     X 1 week    exposed to covid   States he has no sxs    Exposed to dtr w/Covid. Started feeling bad on the 26th, her test was positive that day. She was exposed on the 23rd. She lives in their basement apartment and is quarantining there. Wears a mask and gloves when comes up and he goes outside. Wife out of town.  Patient has hypertension and type 2 diabetes.  They have a pulse oximeter at home.            Problem List:    Patient Active Problem List   Diagnosis   . Bruit   . Transient global amnesia   . Cardiomyopathy   . Cerebral infarction   . Type 2 diabetes mellitus   . Lumbar stenosis with neurogenic claudication   . Neuropathy   . Paroxysmal atrial fibrillation   . Anemia   . Basal cell carcinoma of skin   . Benign prostatic hyperplasia with urinary obstruction   . Essential hypertension   .  History of cerebrovascular accident   . Hyperlipidemia   . Low back pain   . Numbness of hand   . Peripheral vascular disease   . Well adult health check   . Exposure to COVID-19 virus             Current Meds:    Outpatient Medications Marked as Taking for the 10/10/19 encounter (Telemedicine Visit) with Staci Acosta, MD   Medication Sig Dispense Refill   . ALPRAZolam (XANAX) 0.5 MG tablet TK 1  T PO NIGHTLY PRN FOR ANXIETY. TK 1 TABLET BEFORE MRI FOR UP TO 2 DOSES     . amLODIPine (NORVASC) 5 MG tablet Take 5 mg by mouth daily.     Marland Kitchen atorvastatin (LIPITOR) 10 MG tablet Take 10 mg by mouth daily.     Marland Kitchen glucose blood test strip Accu-Chek Aviva Plus test strips     . Lancets (accu-chek multiclix) lancets Accu-Chek Softclix Lancets     . lisinopril (ZESTRIL) 20 MG tablet TAKE 1 TABLET BY MOUTH EVERY DAY. NEED TO SCHEDULE APPOINTMENT 90 tablet 0   . metFORMIN (GLUCOPHAGE) 1000 MG tablet Take 1 tablet (1,000 mg total) by mouth 2 (two) times daily 180 tablet 0   . metoprolol tartrate (LOPRESSOR) 50 MG tablet metoprolol tartrate 50 mg tablet   TAKE 1 TABLET BY MOUTH ONCE DAILY     . tamsulosin (FLOMAX) 0.4 MG Cap Take 0.4 mg by mouth 2 (two) times daily     . [DISCONTINUED] glimepiride (AMARYL) 2 MG tablet TAKE 1 TABLET BY MOUTH EVERY DAY. NEED TO SCHEDULE APPOINTMENT 90 tablet 0          Allergies:    Allergies   Allergen Reactions   . Linezolid Nausea Only, Other (See Comments) and Anaphylaxis     weakness  Decreased appetite, nausea, fatigue.  Decreased appetite, nausea, fatigue.               Past Surgical History:    Past Surgical History:   Procedure Laterality Date   . BACK SURGERY  2017    scoliosis and stenosis - 3 surgeries total   . KNEE SURGERY Left 10/08/1978    medial meniscectomy           Family History:    Family History   Problem Relation Age of Onset   . Diabetes Mother    . Heart disease Father    . Stroke Father    . Diabetes Father            Social History:    Social History     Tobacco Use   .  Smoking status: Former Smoker     Packs/day: 0.50     Years: 50.00     Pack years: 25.00     Quit date: 2014     Years since quitting: 7.0   . Smokeless tobacco: Never Used   Substance Use Topics   . Alcohol use: Not on file   . Drug use: Not on file          The following sections were reviewed this encounter by the provider:            Vital Signs:    BP 142/74   Pulse 61   Temp 97.8 F (36.6 C)   Ht 1.778 m (5\' 10" )   Wt 77.1 kg (170 lb)   BMI 24.39 kg/m          ROS:    Review of Systems   See HPI        Physical Exam:    Physical Exam   GENERAL APPEARANCE: alert, in no acute distress, pleasant, well nourished.   HEAD: normal appearance  EYES: no discharge  EARS: normal hearing  CHEST: no evidence of respiratory distress. Breathing comfortably  PSYCH: appropriate affect, appropriate mood, normal speech, normal attention        Assessment:    1. Exposure to COVID-19 virus  - COVID-19 ASYMPTOMATIC Patient with Provider Order;  Future            Plan:    I would recommend testing for Covid.  Order done.  He is at high risk because of his age and underlying health conditions.  Recommend monitoring for symptoms.  If he develops symptoms, please let us know, I think he would be a good candidate for outpatient infusion treatment.  I will be in touch when I have the results of his test.  He may go to a local center to get tested (it is where his daughter was tested).  He should quarantine for 10 days from the 26th if he does not develop symptoms.          Follow-up:    Return if symptoms worsen or fail to improve.         Staci Acosta, MD

## 2019-10-12 ENCOUNTER — Telehealth (INDEPENDENT_AMBULATORY_CARE_PROVIDER_SITE_OTHER): Payer: Self-pay | Admitting: Family Medicine

## 2019-10-12 NOTE — Telephone Encounter (Signed)
Per patient's wife he is already aware of this results and so far no sxs but will have him call back if he does.

## 2019-10-12 NOTE — Telephone Encounter (Signed)
Please call patient, Covid test is negative.  He should let us know if he develops any symptoms.

## 2019-10-13 ENCOUNTER — Encounter (INDEPENDENT_AMBULATORY_CARE_PROVIDER_SITE_OTHER): Payer: Self-pay | Admitting: Family Medicine

## 2019-10-24 ENCOUNTER — Encounter (INDEPENDENT_AMBULATORY_CARE_PROVIDER_SITE_OTHER): Payer: Self-pay | Admitting: Family Medicine

## 2019-11-06 ENCOUNTER — Encounter (INDEPENDENT_AMBULATORY_CARE_PROVIDER_SITE_OTHER): Payer: Self-pay | Admitting: Family Medicine

## 2019-11-06 ENCOUNTER — Encounter (INDEPENDENT_AMBULATORY_CARE_PROVIDER_SITE_OTHER): Payer: Self-pay

## 2019-11-06 ENCOUNTER — Ambulatory Visit (INDEPENDENT_AMBULATORY_CARE_PROVIDER_SITE_OTHER): Payer: No Typology Code available for payment source | Admitting: Family Medicine

## 2019-11-06 VITALS — BP 122/60 | HR 71 | Temp 97.8°F | Ht 70.0 in | Wt 168.0 lb

## 2019-11-06 DIAGNOSIS — I739 Peripheral vascular disease, unspecified: Secondary | ICD-10-CM

## 2019-11-06 DIAGNOSIS — Z8673 Personal history of transient ischemic attack (TIA), and cerebral infarction without residual deficits: Secondary | ICD-10-CM

## 2019-11-06 DIAGNOSIS — E1142 Type 2 diabetes mellitus with diabetic polyneuropathy: Secondary | ICD-10-CM

## 2019-11-06 DIAGNOSIS — D649 Anemia, unspecified: Secondary | ICD-10-CM

## 2019-11-06 DIAGNOSIS — I480 Paroxysmal atrial fibrillation: Secondary | ICD-10-CM

## 2019-11-06 DIAGNOSIS — I10 Essential (primary) hypertension: Secondary | ICD-10-CM

## 2019-11-06 DIAGNOSIS — E1140 Type 2 diabetes mellitus with diabetic neuropathy, unspecified: Secondary | ICD-10-CM

## 2019-11-06 DIAGNOSIS — E782 Mixed hyperlipidemia: Secondary | ICD-10-CM

## 2019-11-06 DIAGNOSIS — Z1159 Encounter for screening for other viral diseases: Secondary | ICD-10-CM

## 2019-11-06 DIAGNOSIS — Z1211 Encounter for screening for malignant neoplasm of colon: Secondary | ICD-10-CM

## 2019-11-06 DIAGNOSIS — L723 Sebaceous cyst: Secondary | ICD-10-CM

## 2019-11-06 MED ORDER — LISINOPRIL 20 MG PO TABS
20.0000 mg | ORAL_TABLET | Freq: Every day | ORAL | 1 refills | Status: DC
Start: 2019-11-06 — End: 2020-06-15

## 2019-11-06 MED ORDER — GLIMEPIRIDE 2 MG PO TABS
2.0000 mg | ORAL_TABLET | Freq: Every morning | ORAL | 1 refills | Status: DC
Start: 2019-11-06 — End: 2020-05-28

## 2019-11-06 MED ORDER — METFORMIN HCL 1000 MG PO TABS
1000.0000 mg | ORAL_TABLET | Freq: Two times a day (BID) | ORAL | 0 refills | Status: DC
Start: 2019-11-06 — End: 2020-05-02

## 2019-11-06 MED ORDER — AMLODIPINE BESYLATE 5 MG PO TABS
5.0000 mg | ORAL_TABLET | Freq: Every day | ORAL | 1 refills | Status: DC
Start: 2019-11-06 — End: 2020-07-06

## 2019-11-06 MED ORDER — ATORVASTATIN CALCIUM 10 MG PO TABS
10.0000 mg | ORAL_TABLET | Freq: Every day | ORAL | 1 refills | Status: DC
Start: 2019-11-06 — End: 2020-05-24

## 2019-11-06 MED ORDER — METOPROLOL TARTRATE 50 MG PO TABS
50.0000 mg | ORAL_TABLET | Freq: Every day | ORAL | 1 refills | Status: DC
Start: 2019-11-06 — End: 2020-04-10

## 2019-11-06 NOTE — Progress Notes (Signed)
[facility]                       Date of Exam: 11/06/2019 1:43 PM        Patient ID: Brian Pena is a 74 y.o. male.  Attending Physician: Staci Acosta, MD        Chief Complaint:    Chief Complaint   Patient presents with   . Chronic CAre Follow up               HPI:    Hypertension  The patient is being seen for a routine follow-up of hypertension. Hypertension is classified as essential. Comorbid illnesses include hyperlipidemia and diabetes. There are no recent interval events.  There is no interval history of chest pain, chest pressure/discomfort, claudication, dyspnea, exertional chest pressure/discomfort, fatigue, irregular heart beat, low blood pressure, lower extremity edema, near-syncope, orthopnea, palpitations, paroxysmal nocturnal dyspnea, syncope and tachypnea.  Patient is compliant with the current regimen.   Average ambulatory systolic blood pressure is not checked by the patient. Average ambulatory diastolic blood pressure is not checked by the patient. Lifestyle changes have included improved nutrition.The patient's exercise routine consists of walking.     Diabetes Follow-upDiabetes is classified as type II. Diabetes was diagnosed years ago. Comorbid illnesses include hyperlipidemia and hypertension.    Symptoms include foot paresthesias. Patient denies hypoglycemia, blurred vision, chest pain, fatigue, foot ulceration, polydipsia, polyphagia, polyuria, visual changes, weakness, weight loss, dyspnea, lower extremity edema and palpitations. Current therapy includes metformin.  Patient is compliant with the current regimen.  He does not monitor blood glucose at home. Lifestyle changes have included improved nutrition.  The patient's exercise routine consists of walking.  The patient's last retinal exam was >1 year ago. The patient's last podiatry exam was >1 year ago. The patient's last microalbumin testing was >1 year ago.      Hyperlipidemia  The  patient is being seen for a routine follow-up of hyperlipidemia. 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 Comorbid illnesses include hypertension and diabetes.  There are no recent interval events. There is no interval history of fatigue, palpitations, myalgias, chest pain, chest pressure/discomfort, claudication, dyspnea, exertional chest pressure/discomfort, irregular heart beat, lower extremity edema, near-syncope, orthopnea and paroxysmal nocturnal dyspnea. Current therapy includes a statin.  Patient is compliant with the current regimen. Lifestyle changes have included improved nutrition.  The patient's exercise routine consists of walking.              Problem List:    Patient Active Problem List   Diagnosis   . Transient global amnesia   . Cardiomyopathy   . Type 2 diabetes mellitus   . Lumbar stenosis with neurogenic claudication   . Neuropathy   . Paroxysmal atrial fibrillation   . Anemia   . Basal cell carcinoma of skin   . Benign prostatic hyperplasia with urinary obstruction   . Essential hypertension   . History of cerebrovascular accident   . Hyperlipidemia   . Low back pain   . Numbness of hand   . Peripheral vascular disease   . Well adult health check             Current Meds:    Outpatient Medications Marked as Taking for the 11/06/19 encounter (Office Visit) with Staci Acosta, MD   Medication Sig Dispense Refill   . amLODIPine (NORVASC) 5 MG tablet Take 1 tablet (5 mg total) by mouth daily  90 tablet 1   . atorvastatin (LIPITOR) 10 MG tablet Take 1 tablet (10 mg total) by mouth daily 90 tablet 1   . glucose blood test strip Accu-Chek Aviva Plus test strips     . Lancets (accu-chek multiclix) lancets Accu-Chek Softclix Lancets     . lisinopril (ZESTRIL) 20 MG tablet Take 1 tablet (20 mg total) by mouth daily 90 tablet 1   . metFORMIN (GLUCOPHAGE) 1000 MG tablet Take 1 tablet (1,000 mg total) by mouth 2 (two) times daily 180 tablet 0   . metoprolol tartrate (LOPRESSOR) 50 MG  tablet Take 1 tablet (50 mg total) by mouth daily 90 tablet 1   . tamsulosin (FLOMAX) 0.4 MG Cap Take 0.4 mg by mouth 2 (two) times daily     . [DISCONTINUED] amLODIPine (NORVASC) 5 MG tablet Take 5 mg by mouth daily.     . [DISCONTINUED] atorvastatin (LIPITOR) 10 MG tablet Take 10 mg by mouth daily.     . [DISCONTINUED] lisinopril (ZESTRIL) 20 MG tablet TAKE 1 TABLET BY MOUTH EVERY DAY. NEED TO SCHEDULE APPOINTMENT 90 tablet 0   . [DISCONTINUED] metFORMIN (GLUCOPHAGE) 1000 MG tablet Take 1 tablet (1,000 mg total) by mouth 2 (two) times daily 180 tablet 0   . [DISCONTINUED] metoprolol tartrate (LOPRESSOR) 50 MG tablet metoprolol tartrate 50 mg tablet   TAKE 1 TABLET BY MOUTH ONCE DAILY            Allergies:    Allergies   Allergen Reactions   . Linezolid Nausea Only, Other (See Comments) and Anaphylaxis     weakness  Decreased appetite, nausea, fatigue.  Decreased appetite, nausea, fatigue.               Past Surgical History:    Past Surgical History:   Procedure Laterality Date   . BACK SURGERY  2017    scoliosis and stenosis - 3 surgeries total   . KNEE SURGERY Left 10/08/1978    medial meniscectomy           Family History:    Family History   Problem Relation Age of Onset   . Diabetes Mother    . Heart disease Father    . Stroke Father    . Diabetes Father            Social History:    Social History     Tobacco Use   . Smoking status: Former Smoker     Packs/day: 0.50     Years: 50.00     Pack years: 25.00     Quit date: 2014     Years since quitting: 7.1   . Smokeless tobacco: Never Used   Substance Use Topics   . Alcohol use: Not on file   . Drug use: Not on file          The following sections were reviewed this encounter by the provider:            Vital Signs:    BP 122/60 (BP Site: Right arm, Patient Position: Sitting, Cuff Size: Large)   Pulse 71   Temp 97.8 F (36.6 C) (Temporal)   Ht 1.778 m (5\' 10" )   Wt 76.2 kg (168 lb)   SpO2 98%   BMI 24.11 kg/m          ROS:    Review of Systems    Constitutional: Negative for fatigue and fever.   HENT: Positive for hearing loss. Negative for dental problem,  ear pain and sore throat.         Using ear wax remover   Eyes: Negative for visual disturbance.        UTD with optometrist   Respiratory: Negative for cough, shortness of breath and wheezing.    Cardiovascular: Negative for chest pain and palpitations.        No problems with afib in a long time.   Gastrointestinal: Negative for abdominal pain, constipation, diarrhea, nausea and vomiting.   Endocrine:        Sugar goes up into 200s post knee injections   Genitourinary: Positive for frequency and urgency. Negative for difficulty urinating and dysuria.        Seeing NP at urologist office for frequency, using Flomax with benefit.   Musculoskeletal: Positive for arthralgias. Negative for myalgias.        R knee arthritis, getting knee injections every 3 months.   Skin: Negative for rash.        Moderate size sebaceous cyst left anterior chest. It has been getting bigger.   Neurological: Positive for numbness.        Numbness in feet and hand. Has bilateral CTS. Surgery on L wrist w/50% improvement. No change with nocturnal bracing.   Psychiatric/Behavioral: Positive for dysphoric mood. Negative for sleep disturbance. The patient is not nervous/anxious.         Housebound, has affected mood. Doesn't drive because feet are numb. Down because used to be v. Active.  Doesn't feel like eating, snaps at wife, frustrated with disability. No anxiety. Sleep disrupted by knee pain and restless legs.              Physical Exam:    Physical Exam  Vitals signs and nursing note reviewed.   Constitutional:       Appearance: Normal appearance.   HENT:      Right Ear: Tympanic membrane, ear canal and external ear normal.      Left Ear: Tympanic membrane, ear canal and external ear normal.      Mouth/Throat:      Mouth: Mucous membranes are moist.      Pharynx: No oropharyngeal exudate.   Eyes:      Conjunctiva/sclera:  Conjunctivae normal.   Neck:      Musculoskeletal: No muscular tenderness.   Cardiovascular:      Rate and Rhythm: Normal rate and regular rhythm.      Heart sounds: Normal heart sounds. No murmur. No gallop.       Comments: DP pulses +1 both feet.  Pulmonary:      Effort: Pulmonary effort is normal. No respiratory distress.      Breath sounds: No wheezing, rhonchi or rales.   Abdominal:      General: Bowel sounds are normal. There is no distension.      Palpations: Abdomen is soft. There is no mass.      Tenderness: There is no abdominal tenderness. There is no guarding.   Lymphadenopathy:      Cervical: No cervical adenopathy.   Skin:     General: Skin is warm and dry.      Comments: 2 x 3 cm sebaceous cyst on left mid anterior chest.  No skin lesions on his feet.  Toenails are long and curled under, digging into the pads of his toes.   Neurological:      Mental Status: He is alert.      Comments: Sensation on the soles of his feet  is spotty on the right and mostly absent on the left monofilament.   Psychiatric:         Mood and Affect: Affect normal.              Assessment:    1. Type 2 diabetes mellitus with diabetic polyneuropathy, without long-term current use of insulin  - Basic Metabolic Panel  - Hemoglobin A1C  - metFORMIN (GLUCOPHAGE) 1000 MG tablet; Take 1 tablet (1,000 mg total) by mouth 2 (two) times daily  Dispense: 180 tablet; Refill: 0  - glimepiride (AMARYL) 2 MG tablet; Take 1 tablet (2 mg total) by mouth every morning  Dispense: 90 tablet; Refill: 1  - Ambulatory referral to Podiatry    2. Essential hypertension  - Basic Metabolic Panel  - TSH  - Urine Microalbumin Random  - lisinopril (ZESTRIL) 20 MG tablet; Take 1 tablet (20 mg total) by mouth daily  Dispense: 90 tablet; Refill: 1  - amLODIPine (NORVASC) 5 MG tablet; Take 1 tablet (5 mg total) by mouth daily  Dispense: 90 tablet; Refill: 1  - metoprolol tartrate (LOPRESSOR) 50 MG tablet; Take 1 tablet (50 mg total) by mouth daily  Dispense: 90  tablet; Refill: 1    3. Mixed hyperlipidemia  - Hepatic function panel (LFT)  - Lipid panel  - atorvastatin (LIPITOR) 10 MG tablet; Take 1 tablet (10 mg total) by mouth daily  Dispense: 90 tablet; Refill: 1    4. History of cerebrovascular accident    5. Anemia, unspecified type  - CBC and differential    6. Paroxysmal atrial fibrillation    7. Peripheral vascular disease    8. Screen for colon cancer  - Stool Occult Blood, Immunoassay, (Not Guaiac Based)    9. Need for hepatitis C screening test  - Hepatitis C (HCV) antibody, Total    10. Essential (primary) hypertension    11. Type 2 diabetes mellitus with diabetic neuropathy, unspecified whether long term insulin use    12. Sebaceous cyst  - Referral to General Surgery - EXTERNAL            Plan:    He is feeling really restricted by his disabilities, even before Covid started.  He cannot drive because of the neuropathy in his feet and he cannot walk without a walker.  He wants to continue walking with a walker, even though it is slow, because he needs the exercise.  He does not feel a scooter would be helpful.  He admits to feeling depressed but does not want to take medication.  Discussed this as an option, if he feels like symptoms are getting worse, we might want to reconsider.  Medications refilled and blood work updated.  Strongly encouraged to get the Covid vaccine if not for himself at least for his family.  I think the skin lesion on his scalp is a seborrheic keratosis.  He has an enlarging sebaceous cyst on his left chest, referral to surgery for excision.  Toenails are long and curled under.  He has a hard time reaching his toes.  Referral to podiatry for treatment.  Continue to follow-up with urology regarding his urinary frequency and urgency (BPH).  Routine follow-up in 6 months, sooner for problems.            Follow-up:    No follow-ups on file.         Staci Acosta, MD

## 2019-11-10 ENCOUNTER — Other Ambulatory Visit (INDEPENDENT_AMBULATORY_CARE_PROVIDER_SITE_OTHER): Payer: Self-pay | Admitting: Family Medicine

## 2019-11-18 LAB — CBC AND DIFFERENTIAL
Baso(Absolute): 0 10*3/uL (ref 0.0–0.2)
Basos: 0 %
Eos: 2 %
Eosinophils Absolute: 0.1 10*3/uL (ref 0.0–0.4)
Hematocrit: 36.9 % — ABNORMAL LOW (ref 37.5–51.0)
Hemoglobin: 12.2 g/dL — ABNORMAL LOW (ref 13.0–17.7)
Immature Granulocytes Absolute: 0 10*3/uL (ref 0.0–0.1)
Immature Granulocytes: 0 %
Lymphocytes Absolute: 1.7 10*3/uL (ref 0.7–3.1)
Lymphocytes: 31 %
MCH: 30.8 pg (ref 26.6–33.0)
MCHC: 33.1 g/dL (ref 31.5–35.7)
MCV: 93 fL (ref 79–97)
Monocytes Absolute: 0.5 10*3/uL (ref 0.1–0.9)
Monocytes: 9 %
Neutrophils Absolute: 3.3 10*3/uL (ref 1.4–7.0)
Neutrophils: 58 %
Platelets: 171 10*3/uL (ref 150–450)
RBC: 3.96 x10E6/uL — ABNORMAL LOW (ref 4.14–5.80)
RDW: 12 % (ref 11.6–15.4)
WBC: 5.6 10*3/uL (ref 3.4–10.8)

## 2019-11-18 LAB — HEPATIC FUNCTION PANEL
ALT: 18 IU/L (ref 0–44)
AST (SGOT): 22 IU/L (ref 0–40)
Albumin: 4.4 g/dL (ref 3.7–4.7)
Alkaline Phosphatase: 70 IU/L (ref 39–117)
Bilirubin Direct: 0.12 mg/dL (ref 0.00–0.40)
Bilirubin, Total: 0.4 mg/dL (ref 0.0–1.2)
Protein, Total: 6.1 g/dL (ref 6.0–8.5)

## 2019-11-18 LAB — LIPID PANEL
Cholesterol / HDL Ratio: 3.1 ratio (ref 0.0–5.0)
Cholesterol: 125 mg/dL (ref 100–199)
HDL: 40 mg/dL (ref >39)
LDL Chol Calculated (NIH): 62 mg/dL (ref 0–99)
Triglycerides: 133 mg/dL (ref 0–149)
VLDL Calculated: 23 mg/dL (ref 5–40)

## 2019-11-18 LAB — BASIC METABOLIC PANEL
African American eGFR: 61 mL/min/{1.73_m2} (ref >59)
BUN / Creatinine Ratio: 20 (ref 10–24)
BUN: 26 mg/dL (ref 8–27)
CO2: 23 mmol/L (ref 20–29)
Calcium: 9.7 mg/dL (ref 8.6–10.2)
Chloride: 105 mmol/L (ref 96–106)
Creatinine: 1.32 mg/dL — ABNORMAL HIGH (ref 0.76–1.27)
Glucose: 113 mg/dL — ABNORMAL HIGH (ref 65–99)
Potassium: 4.9 mmol/L (ref 3.5–5.2)
Sodium: 140 mmol/L (ref 134–144)
non-African American eGFR: 52 mL/min/{1.73_m2} — ABNORMAL LOW (ref >59)

## 2019-11-18 LAB — MICROALBUMIN, RANDOM URINE
Creatinine, UR: 106.2 mg/dL
Microalb/Crt. Ratio: 28 mg/g creat (ref 0–29)
Microalbumin, UR: 29.7 ug/mL

## 2019-11-18 LAB — HEMOGLOBIN A1C: Hemoglobin A1C: 6.4 % — ABNORMAL HIGH (ref 4.8–5.6)

## 2019-11-18 LAB — TSH: TSH: 1.01 u[IU]/mL (ref 0.450–4.500)

## 2019-11-18 LAB — HEPATITIS C ANTIBODY: HCV AB: <0.1 s/co ratio (ref 0.0–0.9)

## 2019-11-21 NOTE — Progress Notes (Signed)
Dear Mr. Brian Pena,  Your blood count is normal except for the mild anemia. Please make sure you do your test for blood in the stool.Your blood chemistries are normal except for the sugar of 113, the creatinine of 1.32 and the eGFR of 52. Your creatinine is a little higher than it has been. The sugar is pretty good. The other numbers are measures of kidney function and indicate decreased kidney function. Your numbers of been bouncing up and down in this range for several years. This test is very sensitive to dehydration so it is important when you get your bloodwork done to make sure that you have plenty of fluid the day before and the day of testing. We should continue to monitor this when we do your every six months bloodwork. If your eGFR falls below 30 it will be time to see a kidney specialist. Your liver enzymes are normal. Your screening hepatitis C test is negative. Your cholesterol is well-controlled. Your thyroid and urine microalbumin creatinine ratio (another kidney test) are normal. Your  hemoglobin A1c shows excellent diabetic control. Let me know if you have any questions.  Dr. Elwyn Lade

## 2019-11-24 ENCOUNTER — Other Ambulatory Visit (INDEPENDENT_AMBULATORY_CARE_PROVIDER_SITE_OTHER): Payer: Self-pay | Admitting: Family Medicine

## 2019-11-24 DIAGNOSIS — E782 Mixed hyperlipidemia: Secondary | ICD-10-CM

## 2019-11-24 NOTE — Telephone Encounter (Signed)
**  1st COURTESY**     30 tablet, 0 refills pending for rosuvastatin (CRESTOR) 5 MG tablet    Last VV - 10/10/19 (Covid Screening)  Last OV (Per [facility]) - 11/14/18 (6 month follow-up)  Last OV (Per Jake Samples) - 06/28/18 (AWV)     FRONT DESK - 74 y/o male; overdue for physical.    Please assess, thank you!

## 2019-11-24 NOTE — Telephone Encounter (Signed)
Patient had chronic care follow-up 11/06/2019.  He does not need follow-up at this time.  He will be due for follow-up in 6 months.

## 2019-11-30 ENCOUNTER — Other Ambulatory Visit (INDEPENDENT_AMBULATORY_CARE_PROVIDER_SITE_OTHER): Payer: Self-pay | Admitting: Family Medicine

## 2019-12-07 LAB — STOOL OCCULT BLOOD, IMMUNOASSAY, (NOT GUAIAC BASED): Occult Blood, Fecal, IA: NEGATIVE

## 2019-12-12 NOTE — Progress Notes (Signed)
Dear Mr. Esperanza Richters,  Your test for blood in the stool is negative. We should repeat it in one year. Let me know if you have any questions.  Dr. Elwyn Lade

## 2020-01-16 ENCOUNTER — Encounter (INDEPENDENT_AMBULATORY_CARE_PROVIDER_SITE_OTHER): Payer: Self-pay | Admitting: Family Medicine

## 2020-01-25 ENCOUNTER — Encounter (INDEPENDENT_AMBULATORY_CARE_PROVIDER_SITE_OTHER): Payer: Self-pay | Admitting: Family Medicine

## 2020-02-15 ENCOUNTER — Encounter (INDEPENDENT_AMBULATORY_CARE_PROVIDER_SITE_OTHER): Payer: Self-pay | Admitting: Family Medicine

## 2020-03-05 ENCOUNTER — Telehealth: Payer: Self-pay

## 2020-03-05 NOTE — Telephone Encounter (Signed)
Medication Adherence    Pt was identified to have poor adherence to Lisinopril medication via report.     Subjective    03/05/2020 @ 3:25 PM Outreached to pt to assess poor adherence. Unable to reach patient. Left VM with a callback number.     Last medication pick-up: 02/10/2020  Patient's last BP reading obtained 11/06/2019: 122/60 mmHg       Second attempt 03/06/2020 @ 11:10 AM pt returned call to assess poor adherence.   Patient never misses his medications.   He lays it out every morning when he is taking his breakfast.   He checks his blood pressure regularly and his blood pressure reading are usually 140-150/70-80 mmHg.  This morning his blood pressure reading was 130/75 mmHg.  He denies dizziness, angioedema, shortness of breath and dry cough.   Local pharmacy of patient filled 90 day supply for better adherence.         Objective    Current Outpatient Medications   Medication Sig   . amLODIPine (NORVASC) 5 MG tablet Take 1 tablet (5 mg total) by mouth daily   . atorvastatin (LIPITOR) 10 MG tablet Take 1 tablet (10 mg total) by mouth daily   . glimepiride (AMARYL) 2 MG tablet Take 1 tablet (2 mg total) by mouth every morning   . glucose blood test strip Accu-Chek Aviva Plus test strips   . Lancets (accu-chek multiclix) lancets Accu-Chek Softclix Lancets   . lisinopril (ZESTRIL) 20 MG tablet Take 1 tablet (20 mg total) by mouth daily   . metFORMIN (GLUCOPHAGE) 1000 MG tablet Take 1 tablet (1,000 mg total) by mouth 2 (two) times daily   . metoprolol tartrate (LOPRESSOR) 50 MG tablet Take 1 tablet (50 mg total) by mouth daily   . rosuvastatin (CRESTOR) 5 MG tablet TAKE 1 TABLET BY MOUTH ONCE DAILY   . tamsulosin (FLOMAX) 0.4 MG Cap Take 0.4 mg by mouth 2 (two) times daily       Sodium   Date Value Ref Range Status   11/17/2019 140 134 - 144 mmol/L Final     Potassium   Date Value Ref Range Status   11/17/2019 4.9 3.5 - 5.2 mmol/L Final     Chloride   Date Value Ref Range Status   11/17/2019 105 96 - 106 mmol/L  Final     BUN   Date Value Ref Range Status   11/17/2019 26 8 - 27 mg/dL Final     Creatinine   Date Value Ref Range Status   11/17/2019 1.32 (H) 0.76 - 1.27 mg/dL Final     Glucose   Date Value Ref Range Status   11/17/2019 113 (H) 65 - 99 mg/dL Final   54/05/8118 147 (H) 70 - 100 mg/dL Final     Comment:     ADA guidelines for diabetes mellitus:  Fasting:  Equal to or greater than 126 mg/dL  Random:   Equal to or greater than 200 mg/dL         Lab Results   Component Value Date    WBC 5.6 11/17/2019    HGB 12.2 (L) 11/17/2019    HCT 36.9 (L) 11/17/2019    MCV 93 11/17/2019    PLT 171 11/17/2019       Lab Results   Component Value Date    MICROALB 29.7 11/17/2019       Lab Results   Component Value Date    CHOL 125 11/17/2019    CHOL 151  11/23/2018    CHOL 138 05/02/2018     Lab Results   Component Value Date    HDL 40 11/17/2019    HDL 48 11/23/2018    HDL 36 (L) 05/02/2018     Lab Results   Component Value Date    LDL 62 11/17/2019    LDL 81 11/23/2018    LDL 71 05/02/2018     Lab Results   Component Value Date    TRIG 133 11/17/2019    TRIG 110 11/23/2018    TRIG 156 (H) 05/02/2018       Lab Results   Component Value Date    ALT 18 11/17/2019    AST 22 11/17/2019    ALKPHOS 70 11/17/2019    BILITOTAL 0.4 11/17/2019       Lab Results   Component Value Date    HGBA1C 6.4 (H) 11/17/2019    HGBA1C 6.1 (H) 11/23/2018    HGBA1C 6.7 (H) 05/02/2018       BP Readings from Last 3 Encounters:   11/06/19 122/60   10/10/19 142/74   11/14/18 116/60     Pulse Readings from Last 3 Encounters:   11/06/19 71   10/10/19 61   11/14/18 (!) 51       No results found for: B12    Assessment/Plan    Educate pt the use of Lisinopril medication   Educate patient on taking book to next doctor's appointment  Utilize pill box to improve medication adherence  Refill the prescriptions    Joia Doyle Joe-Louis  PharmD Candidate  [address]  [place], [place] [address]  [place] [phone]

## 2020-03-07 NOTE — Telephone Encounter (Signed)
I have reviewed and agree with the student's assessment and medication history.    Marvene Strohm J Tobechukwu Emmick, PharmD  Clinical Pharmacy Specialist  [address]  [place], [place] [address]  O [phone]    C [phone]

## 2020-04-10 ENCOUNTER — Other Ambulatory Visit (INDEPENDENT_AMBULATORY_CARE_PROVIDER_SITE_OTHER): Payer: Self-pay | Admitting: Family Medicine

## 2020-04-10 DIAGNOSIS — I10 Essential (primary) hypertension: Secondary | ICD-10-CM

## 2020-04-10 NOTE — Telephone Encounter (Signed)
Pt requesting a refill of metoprolol 50mg  to be submitted to [facility]. Pt has 1 tab left. Pls assess and f/u as needed

## 2020-04-10 NOTE — Telephone Encounter (Signed)
Please review Rx refill    90 tablet, 0 refill pended for metoprolol tartrate (LOPRESSOR) 50 MG tablet      Last OV 11/06/19 for Type 2 diabetes mellitus with diabetic polyneuropathy, without long-term current use of insulin +11 more    Please refill if appropriate,    Thank you.

## 2020-04-11 MED ORDER — METOPROLOL TARTRATE 50 MG PO TABS
50.0000 mg | ORAL_TABLET | Freq: Every day | ORAL | 0 refills | Status: DC
Start: 2020-04-11 — End: 2020-07-17

## 2020-04-11 NOTE — Telephone Encounter (Signed)
Patient is due for follow-up appointment in September, please get him scheduled.

## 2020-04-18 ENCOUNTER — Telehealth: Payer: Self-pay

## 2020-04-18 NOTE — Telephone Encounter (Signed)
Medication Adherence    Pt was identified to have poor adherence to Lisinopril 20mg  via report.     Subjective    Outreached to pt to assess poor adherence.   [facility] confirms Pt picked up an 88-day supply on 04/10/20  Unable to reach patient. Left message with patient and requested callback.    Rockney Ghee  PharmD Candidate  [address]  [place], [place] [address]  [place] [phone]

## 2020-04-19 NOTE — Telephone Encounter (Signed)
I have reviewed and agree with the student's assessment and medication history.    Derrell Milanes, Pharm.D.  Clinical Pharmacy Specialist  [facility]  O [phone] I C [phone]

## 2020-04-30 ENCOUNTER — Encounter (INDEPENDENT_AMBULATORY_CARE_PROVIDER_SITE_OTHER): Payer: Self-pay | Admitting: Family Medicine

## 2020-04-30 ENCOUNTER — Telehealth (INDEPENDENT_AMBULATORY_CARE_PROVIDER_SITE_OTHER): Payer: Self-pay | Admitting: Family Medicine

## 2020-04-30 NOTE — Telephone Encounter (Signed)
Pt calling to see if he can be seen for infected cyst on his chest. Pt states he has had the infection for bout 5 days however it is now getting worse. Pt states he has been able to express fluid for the cyst (gray liquid like texture, pt states there was odor as well). Pt is a diabetic and fears it will get worse. [facility] completely booked

## 2020-04-30 NOTE — Telephone Encounter (Signed)
I could see pt tomorrow at 12:30, he will need to be added as work in. If he needs care sooner rec. [facility] on [address]. Or [facility] walk in clinic at [facility] [address] Next to [facility]. I will also try contacting by MyChart.

## 2020-04-30 NOTE — Telephone Encounter (Signed)
Scheduled

## 2020-05-01 ENCOUNTER — Ambulatory Visit (INDEPENDENT_AMBULATORY_CARE_PROVIDER_SITE_OTHER): Payer: No Typology Code available for payment source | Admitting: Family Medicine

## 2020-05-01 ENCOUNTER — Encounter (INDEPENDENT_AMBULATORY_CARE_PROVIDER_SITE_OTHER): Payer: Self-pay | Admitting: Family Medicine

## 2020-05-01 ENCOUNTER — Telehealth: Payer: Self-pay

## 2020-05-01 VITALS — BP 118/72 | HR 45 | Temp 97.9°F | Ht 70.0 in | Wt 165.0 lb

## 2020-05-01 DIAGNOSIS — L089 Local infection of the skin and subcutaneous tissue, unspecified: Secondary | ICD-10-CM

## 2020-05-01 DIAGNOSIS — L723 Sebaceous cyst: Secondary | ICD-10-CM

## 2020-05-01 MED ORDER — CEFUROXIME AXETIL 250 MG PO TABS
250.00 mg | ORAL_TABLET | Freq: Two times a day (BID) | ORAL | 0 refills | Status: AC
Start: 2020-05-01 — End: 2020-05-08

## 2020-05-01 NOTE — Progress Notes (Signed)
[facility] [place] [facility]                       Date of Exam: 05/01/2020 2:05 PM        Patient ID: Brian Pena is a 74 y.o. male.  Attending Physician: Staci Acosta, MD        Chief Complaint:    Chief Complaint   Patient presents with   . Cyst     Chest; sebaceous                HPI:    74 y/o male presents to clinic for an sebaceous cyst on his chest; left upper side. White in color, redness surrounding cyst, strong odor and drainage is greenish-gray in color per patient report. Pain upon touch 1/10 on numeric scale. Patient reports having sebaceous cyst for many years and has continued to grow throughout the years.     Long-standing cyst in the anterior left chest. Over the last week the cyst has been become inflamed, more swollen and tender.  It is draining foul-smelling material.  No allergies to anesthetics.  No fever or chills.            Problem List:    Patient Active Problem List   Diagnosis   . Transient global amnesia   . Cardiomyopathy   . Type 2 diabetes mellitus   . Lumbar stenosis with neurogenic claudication   . Neuropathy   . Paroxysmal atrial fibrillation   . Anemia   . Basal cell carcinoma of skin   . Benign prostatic hyperplasia with urinary obstruction   . Essential hypertension   . History of cerebrovascular accident   . Hyperlipidemia   . Low back pain   . Numbness of hand   . Peripheral vascular disease   . Well adult health check             Current Meds:    Outpatient Medications Marked as Taking for the 05/01/20 encounter (Office Visit) with Staci Acosta, MD   Medication Sig Dispense Refill   . acetaminophen (TYLENOL) 500 MG tablet Take 500 mg by mouth every 6 (six) hours as needed for Pain     . amLODIPine (NORVASC) 5 MG tablet Take 1 tablet (5 mg total) by mouth daily 90 tablet 1   . CALCIUM PO Take 1,000 mg by mouth daily     . glimepiride (AMARYL) 2 MG tablet Take 1 tablet (2 mg total) by mouth every morning 90 tablet 1   . glucose blood test  strip Accu-Chek Aviva Plus test strips     . Lancets (accu-chek multiclix) lancets Accu-Chek Softclix Lancets     . lisinopril (ZESTRIL) 20 MG tablet Take 1 tablet (20 mg total) by mouth daily 90 tablet 1   . metFORMIN (GLUCOPHAGE) 1000 MG tablet Take 1 tablet (1,000 mg total) by mouth 2 (two) times daily 180 tablet 0   . metoprolol tartrate (LOPRESSOR) 50 MG tablet Take 1 tablet (50 mg total) by mouth daily 90 tablet 0   . Multiple Vitamin (MULTIVITAMIN ADULT PO) Take by mouth daily     . rosuvastatin (CRESTOR) 5 MG tablet TAKE 1 TABLET BY MOUTH ONCE DAILY 90 tablet 1   . tamsulosin (FLOMAX) 0.4 MG Cap Take 0.4 mg by mouth 2 (two) times daily            Allergies:    Allergies   Allergen Reactions   .  Linezolid Nausea Only, Other (See Comments) and Anaphylaxis     weakness  Decreased appetite, nausea, fatigue.  Decreased appetite, nausea, fatigue.               Past Surgical History:    Past Surgical History:   Procedure Laterality Date   . BACK SURGERY  2017    scoliosis and stenosis - 3 surgeries total   . KNEE SURGERY Left 10/08/1978    medial meniscectomy           Family History:    Family History   Problem Relation Age of Onset   . Diabetes Mother    . Heart disease Father    . Stroke Father    . Diabetes Father            Social History:    Social History     Tobacco Use   . Smoking status: Former Smoker     Packs/day: 0.50     Years: 50.00     Pack years: 25.00     Quit date: 2014     Years since quitting: 7.6   . Smokeless tobacco: Never Used   Vaping Use   . Vaping Use: Never used   Substance Use Topics   . Alcohol use: Not Currently     Alcohol/week: 0.0 standard drinks   . Drug use: Never          The following sections were reviewed this encounter by the provider:            Vital Signs:    BP 118/72 (BP Site: Left arm, Patient Position: Sitting, Cuff Size: Medium)   Pulse (!) 45 Comment: Patient reports range from 45-55 is WNL.  Temp 97.9 F (36.6 C) (Tympanic)   Ht 1.778 m (5\' 10" )   Wt 74.8 kg  (165 lb)   SpO2 98%   BMI 23.68 kg/m          ROS:    Review of Systems   See HPI        Physical Exam:    Physical Exam  Vitals and nursing note reviewed.   Constitutional:       Appearance: Normal appearance. He is not ill-appearing.   Pulmonary:      Effort: Pulmonary effort is normal. No respiratory distress.   Skin:     Comments: There is a 4-5 cm area of erythema in the left anterior chest with two areas that are spontaneously draining.  The area is fluctuant and tender.   Neurological:      Mental Status: He is alert.   Psychiatric:         Mood and Affect: Mood normal.         Behavior: Behavior normal.              Assessment:    1. Infected sebaceous cyst  - cefuroxime (CEFTIN) 250 MG tablet; Take 1 tablet (250 mg total) by mouth 2 (two) times daily for 7 days  Dispense: 14 tablet; Refill: 0  - Wound culture (IL/LC/QU)            Plan:    The procedure of I&D was reviewed with the patient.  He is not allergic to anesthetics.  The skin was anesthetized with 1% Xylocaine without epinephrine.  An incision was made over the upper draining site which had been draining longer.  Incision was approximately 1 cm and horizontal length.  A large amount of purulent/caseous  material was expressed.  Suture holders were used to break up any adhesions.  After the caseous material had been expressed the wound was packed with 1% quarter inch gauze.  This required a fairly long piece of gauze.  A dry sterile dressing was applied.  He should change the dressing at least daily and if it gets saturated.  Please be careful when removing the dressing since the packing will likely adhere to the undersurface and may be pulled out if the dressing is removed too vigorously.  Follow-up in the office on the 27th for reevaluation/removal of packing/repacking.  Start cefuroxime 250 twice a day for 7 days.  Please take with breakfast and dinner and take a probiotic such as Culturelle or Florastor at bedtime for 1 month.           Follow-up:    12:30 August 27        Staci Acosta, MD

## 2020-05-01 NOTE — Telephone Encounter (Signed)
Medication Adherence        Subjective     Outreached to pt to assess poor adherence.   Pt was identified to have poor adherence to Lisinopril 20 mg via report.    [facility] confirms Pt picked up an 88-day supply on 04/10/20   Reviewed medication list with patient, patient reported discontinuation of Crestor 5mg  and Accucheck products. Pt reports using One touch kit currently.    Pt denies using a pill box; also reports no missed doses       Added:     Multivitamin daily     Calcium 500mg  daily   Ibuprofen 200mg  twice  Daily    One touch kit/lancets/strips     Discontinued:   One touch products   Crestor 5mg        Objective    Current Outpatient Medications   Medication Sig   . amLODIPine (NORVASC) 5 MG tablet Take 1 tablet (5 mg total) by mouth daily   . atorvastatin (LIPITOR) 10 MG tablet Take 1 tablet (10 mg total) by mouth daily   . glimepiride (AMARYL) 2 MG tablet Take 1 tablet (2 mg total) by mouth every morning   . glucose blood test strip Accu-Chek Aviva Plus test strips   . Lancets (accu-chek multiclix) lancets Accu-Chek Softclix Lancets   . lisinopril (ZESTRIL) 20 MG tablet Take 1 tablet (20 mg total) by mouth daily   . metFORMIN (GLUCOPHAGE) 1000 MG tablet Take 1 tablet (1,000 mg total) by mouth 2 (two) times daily   . metoprolol tartrate (LOPRESSOR) 50 MG tablet Take 1 tablet (50 mg total) by mouth daily   . rosuvastatin (CRESTOR) 5 MG tablet TAKE 1 TABLET BY MOUTH ONCE DAILY   . tamsulosin (FLOMAX) 0.4 MG Cap Take 0.4 mg by mouth 2 (two) times daily       Sodium   Date Value Ref Range Status   11/17/2019 140 134 - 144 mmol/L Final     Potassium   Date Value Ref Range Status   11/17/2019 4.9 3.5 - 5.2 mmol/L Final     Chloride   Date Value Ref Range Status   11/17/2019 105 96 - 106 mmol/L Final     BUN   Date Value Ref Range Status   11/17/2019 26 8 - 27 mg/dL Final     Creatinine   Date Value Ref Range Status   11/17/2019 1.32 (H) 0.76 - 1.27 mg/dL Final     Glucose   Date Value Ref Range  Status   11/17/2019 113 (H) 65 - 99 mg/dL Final   16/06/9603 540 (H) 70 - 100 mg/dL Final     Comment:     ADA guidelines for diabetes mellitus:  Fasting:  Equal to or greater than 126 mg/dL  Random:   Equal to or greater than 200 mg/dL         Lab Results   Component Value Date    WBC 5.6 11/17/2019    HGB 12.2 (L) 11/17/2019    HCT 36.9 (L) 11/17/2019    MCV 93 11/17/2019    PLT 171 11/17/2019       Lab Results   Component Value Date    MICROALB 29.7 11/17/2019       Lab Results   Component Value Date    CHOL 125 11/17/2019    CHOL 151 11/23/2018    CHOL 138 05/02/2018     Lab Results   Component Value Date    HDL 40  11/17/2019    HDL 48 11/23/2018    HDL 36 (L) 05/02/2018     Lab Results   Component Value Date    LDL 62 11/17/2019    LDL 81 11/23/2018    LDL 71 05/02/2018     Lab Results   Component Value Date    TRIG 133 11/17/2019    TRIG 110 11/23/2018    TRIG 156 (H) 05/02/2018       Lab Results   Component Value Date    ALT 18 11/17/2019    AST 22 11/17/2019    ALKPHOS 70 11/17/2019    BILITOTAL 0.4 11/17/2019       Lab Results   Component Value Date    HGBA1C 6.4 (H) 11/17/2019    HGBA1C 6.1 (H) 11/23/2018    HGBA1C 6.7 (H) 05/02/2018       BP Readings from Last 3 Encounters:   11/06/19 122/60   10/10/19 142/74   11/14/18 116/60     Pulse Readings from Last 3 Encounters:   11/06/19 71   10/10/19 61   11/14/18 (!) 51       No results found for: B12    Assessment/Plan        Pt should utilize a pill box to improve medication adherence   Pt should continue all medications as prescribed and report an medication side effects       Rockney Ghee  PharmD Candidate  [address]  [place], [place] [address]  [place] [phone]

## 2020-05-01 NOTE — Telephone Encounter (Signed)
I have reviewed and agree with the student's assessment and medication history.    Richelle Glick, Pharm.D.  PGY2 Ambulatory Care Pharmacy Resident  [address], [address]   [place], [place] [address]  O [phone]

## 2020-05-02 ENCOUNTER — Other Ambulatory Visit (INDEPENDENT_AMBULATORY_CARE_PROVIDER_SITE_OTHER): Payer: Self-pay | Admitting: Family Medicine

## 2020-05-02 DIAGNOSIS — E782 Mixed hyperlipidemia: Secondary | ICD-10-CM

## 2020-05-02 DIAGNOSIS — E1142 Type 2 diabetes mellitus with diabetic polyneuropathy: Secondary | ICD-10-CM

## 2020-05-02 NOTE — Telephone Encounter (Signed)
Please review Rx refill and advise    Last OV 11/06/19 for Type 2 diabetes mellitus with diabetic polyneuropathy, without long-term current use of insulin +11 more    Patient has appt schedule for tomorrow (05/03/20) at 12:30    180 tablet, 1 refill pended for metFORMIN (GLUCOPHAGE) 1000 MG tablet    AND    90 tablet, 1 refill pended for rosuvastatin (CRESTOR) 5 MG tablet    Please refill if appropriate    Thank you

## 2020-05-03 ENCOUNTER — Ambulatory Visit (INDEPENDENT_AMBULATORY_CARE_PROVIDER_SITE_OTHER): Payer: No Typology Code available for payment source | Admitting: Family Medicine

## 2020-05-03 ENCOUNTER — Encounter (INDEPENDENT_AMBULATORY_CARE_PROVIDER_SITE_OTHER): Payer: Self-pay | Admitting: Family Medicine

## 2020-05-03 DIAGNOSIS — L089 Local infection of the skin and subcutaneous tissue, unspecified: Secondary | ICD-10-CM

## 2020-05-03 DIAGNOSIS — L723 Sebaceous cyst: Secondary | ICD-10-CM

## 2020-05-03 NOTE — Progress Notes (Signed)
[facility] [place] [facility]                       Date of Exam: 05/03/2020 9:43 PM        Patient ID: Brian Pena is a 74 y.o. male.  Attending Physician: Staci Acosta, MD        Chief Complaint:    Chief Complaint   Patient presents with   . Wound Infection               HPI:    Patient presents to clinic for a wound evaluation from a cyst removal.    Here for follow-up infected sebaceous cyst that was drained on the 25th. He has not had much bleeding from it and changed the dressing yesterday.  He is feeling better.            Problem List:    Patient Active Problem List   Diagnosis   . Transient global amnesia   . Cardiomyopathy   . Type 2 diabetes mellitus   . Lumbar stenosis with neurogenic claudication   . Neuropathy   . Paroxysmal atrial fibrillation   . Anemia   . Basal cell carcinoma of skin   . Benign prostatic hyperplasia with urinary obstruction   . Essential hypertension   . History of cerebrovascular accident   . Hyperlipidemia   . Low back pain   . Numbness of hand   . Peripheral vascular disease   . Well adult health check   . Infected sebaceous cyst             Current Meds:    No outpatient medications have been marked as taking for the 05/03/20 encounter (Office Visit) with Staci Acosta, MD.          Allergies:    Allergies   Allergen Reactions   . Linezolid Nausea Only, Other (See Comments) and Anaphylaxis     weakness  Decreased appetite, nausea, fatigue.  Decreased appetite, nausea, fatigue.               Past Surgical History:    Past Surgical History:   Procedure Laterality Date   . BACK SURGERY  2017    scoliosis and stenosis - 3 surgeries total   . KNEE SURGERY Left 10/08/1978    medial meniscectomy           Family History:    Family History   Problem Relation Age of Onset   . Diabetes Mother    . Heart disease Father    . Stroke Father    . Diabetes Father            Social History:    Social History     Tobacco Use   . Smoking status: Former Smoker      Packs/day: 0.50     Years: 50.00     Pack years: 25.00     Quit date: 2014     Years since quitting: 7.6   . Smokeless tobacco: Never Used   Vaping Use   . Vaping Use: Never used   Substance Use Topics   . Alcohol use: Not Currently     Alcohol/week: 0.0 standard drinks   . Drug use: Never          The following sections were reviewed this encounter by the provider:            Vital Signs:  BP 128/62 (BP Site: Left arm, Patient Position: Sitting, Cuff Size: Medium)   Pulse (!) 48   Temp 97.5 F (36.4 C) (Tympanic)   SpO2 99%          ROS:    Review of Systems   See HPI        Physical Exam:    Physical Exam  Vitals and nursing note reviewed.   Constitutional:       Appearance: Normal appearance. He is not ill-appearing.   Pulmonary:      Effort: Pulmonary effort is normal. No respiratory distress.   Skin:     Comments: There is a large sebaceous cyst in the left anterior chest. This is much less inflamed than it was previously. There is minimal bloody drainage present.   Neurological:      Mental Status: He is alert.   Psychiatric:         Mood and Affect: Mood normal.         Behavior: Behavior normal.              Assessment:    1. Infected sebaceous cyst            Plan:    Inflammation has substantially subsided compared with two days ago. Approximately 4 cm of packing was removed. Dressing was applied to the wound. Please change at home tomorrow and return on Monday the 30th for reevaluation. He thinks his family would be able to pull the packing out at home. If that is the case they could remove 2 inches a day.          Follow-up:    Monday, August 30.        Staci Acosta, MD

## 2020-05-05 DIAGNOSIS — L089 Local infection of the skin and subcutaneous tissue, unspecified: Secondary | ICD-10-CM | POA: Insufficient documentation

## 2020-05-05 DIAGNOSIS — L723 Sebaceous cyst: Secondary | ICD-10-CM | POA: Insufficient documentation

## 2020-05-06 ENCOUNTER — Ambulatory Visit (INDEPENDENT_AMBULATORY_CARE_PROVIDER_SITE_OTHER): Payer: No Typology Code available for payment source | Admitting: Family Medicine

## 2020-05-06 ENCOUNTER — Encounter (INDEPENDENT_AMBULATORY_CARE_PROVIDER_SITE_OTHER): Payer: Self-pay | Admitting: Family Medicine

## 2020-05-06 VITALS — BP 118/84 | HR 45 | Temp 98.3°F | Wt 165.0 lb

## 2020-05-06 DIAGNOSIS — L723 Sebaceous cyst: Secondary | ICD-10-CM

## 2020-05-06 DIAGNOSIS — L089 Local infection of the skin and subcutaneous tissue, unspecified: Secondary | ICD-10-CM

## 2020-05-06 LAB — CULTURE + GRAM STAIN,AEROBIC, WOUND

## 2020-05-06 NOTE — Progress Notes (Signed)
[facility] [place] [facility]                       Date of Exam: 05/06/2020 1:05 PM        Patient ID: Brian Pena is a 74 y.o. male.  Attending Physician: Staci Acosta, MD        Chief Complaint:    Chief Complaint   Patient presents with   . Wound Check     Follow up               HPI:    HPI     Visit to follow-up I&D large abscess anterior left chest wall.  He reports that he is doing okay.  It has been painful to pull the packing out over the weekend and he has a little bit of bleeding after that is encouraged, otherwise he is doing well.            Problem List:    Patient Active Problem List   Diagnosis   . Transient global amnesia   . Cardiomyopathy   . Type 2 diabetes mellitus   . Lumbar stenosis with neurogenic claudication   . Neuropathy   . Paroxysmal atrial fibrillation   . Anemia   . Basal cell carcinoma of skin   . Benign prostatic hyperplasia with urinary obstruction   . Essential hypertension   . History of cerebrovascular accident   . Hyperlipidemia   . Low back pain   . Numbness of hand   . Peripheral vascular disease   . Well adult health check   . Infected sebaceous cyst             Current Meds:    Outpatient Medications Marked as Taking for the 05/06/20 encounter (Office Visit) with Staci Acosta, MD   Medication Sig Dispense Refill   . acetaminophen (TYLENOL) 500 MG tablet Take 500 mg by mouth every 6 (six) hours as needed for Pain     . amLODIPine (NORVASC) 5 MG tablet Take 1 tablet (5 mg total) by mouth daily 90 tablet 1   . atorvastatin (LIPITOR) 10 MG tablet Take 1 tablet (10 mg total) by mouth daily 90 tablet 1   . CALCIUM PO Take 1,000 mg by mouth daily     . cefuroxime (CEFTIN) 250 MG tablet Take 1 tablet (250 mg total) by mouth 2 (two) times daily for 7 days 14 tablet 0   . glimepiride (AMARYL) 2 MG tablet Take 1 tablet (2 mg total) by mouth every morning 90 tablet 1   . glucose blood test strip Accu-Chek Aviva Plus test strips     . Lancets  (accu-chek multiclix) lancets Accu-Chek Softclix Lancets     . lisinopril (ZESTRIL) 20 MG tablet Take 1 tablet (20 mg total) by mouth daily 90 tablet 1   . metFORMIN (GLUCOPHAGE) 1000 MG tablet TAKE 1 TABLET BY MOUTH TWICE DAILY 180 tablet 1   . metoprolol tartrate (LOPRESSOR) 50 MG tablet Take 1 tablet (50 mg total) by mouth daily 90 tablet 0   . Multiple Vitamin (MULTIVITAMIN ADULT PO) Take by mouth daily     . rosuvastatin (CRESTOR) 5 MG tablet TAKE 1 TABLET BY MOUTH ONCE DAILY 90 tablet 1   . tamsulosin (FLOMAX) 0.4 MG Cap Take 0.4 mg by mouth 2 (two) times daily            Allergies:    Allergies  Allergen Reactions   . Linezolid Nausea Only, Other (See Comments) and Anaphylaxis     weakness  Decreased appetite, nausea, fatigue.  Decreased appetite, nausea, fatigue.               Past Surgical History:    Past Surgical History:   Procedure Laterality Date   . BACK SURGERY  2017    scoliosis and stenosis - 3 surgeries total   . KNEE SURGERY Left 10/08/1978    medial meniscectomy           Family History:    Family History   Problem Relation Age of Onset   . Diabetes Mother    . Heart disease Father    . Stroke Father    . Diabetes Father            Social History:    Social History     Tobacco Use   . Smoking status: Former Smoker     Packs/day: 0.50     Years: 50.00     Pack years: 25.00     Quit date: 2014     Years since quitting: 7.6   . Smokeless tobacco: Never Used   Vaping Use   . Vaping Use: Never used   Substance Use Topics   . Alcohol use: Not Currently     Alcohol/week: 0.0 standard drinks   . Drug use: Never          The following sections were reviewed this encounter by the provider:            Vital Signs:    BP 118/84 (BP Site: Right arm, Patient Position: Sitting, Cuff Size: Medium)   Pulse (!) 45   Temp 98.3 F (36.8 C) (Tympanic)   Wt 74.8 kg (165 lb)   SpO2 97%   BMI 23.68 kg/m          ROS:    Review of Systems           Physical Exam:    Physical Exam  Vitals and nursing note  reviewed.   Constitutional:       Appearance: Normal appearance. He is not ill-appearing.   Pulmonary:      Effort: Pulmonary effort is normal. No respiratory distress.   Skin:     Comments: The inflammation around the incision has almost completely subsided.  There is a little bit of bloody drainage on the dressing but nothing that comes out when I remove the packing.   Neurological:      Mental Status: He is alert.   Psychiatric:         Mood and Affect: Mood normal.         Behavior: Behavior normal.              Assessment:    1. Infected sebaceous cyst            Plan:    Satisfactory improvement in the appearance of the infected sebaceous cyst post I&D.  His son will pull 2 inches of packing out a day and we will follow up either at the end of this week at the beginning of next week depending on whether he is going away for the holidays.  Five cm of packing was removed today and dressing reapplied.          Follow-up:    Follow-up in 5-7 days.        Staci Acosta, MD

## 2020-05-06 NOTE — Progress Notes (Signed)
Dear Mr. Olden,  Your skin culture is growing normal skin bacteria. This is a pretty typical finding for this sort of infection. The most important thing to do is drain the abscess, which we have done.  Dr. Elwyn Lade

## 2020-05-16 ENCOUNTER — Ambulatory Visit (INDEPENDENT_AMBULATORY_CARE_PROVIDER_SITE_OTHER): Payer: No Typology Code available for payment source | Admitting: Family Medicine

## 2020-05-16 ENCOUNTER — Encounter (INDEPENDENT_AMBULATORY_CARE_PROVIDER_SITE_OTHER): Payer: Self-pay | Admitting: Family Medicine

## 2020-05-16 VITALS — BP 124/60 | HR 45 | Temp 96.7°F | Wt 164.0 lb

## 2020-05-16 DIAGNOSIS — L723 Sebaceous cyst: Secondary | ICD-10-CM

## 2020-05-16 DIAGNOSIS — L089 Local infection of the skin and subcutaneous tissue, unspecified: Secondary | ICD-10-CM

## 2020-05-16 NOTE — Progress Notes (Signed)
[facility] [place] [facility]                       Date of Exam: 05/16/2020 3:40 PM        Patient ID: Brian Pena is a 74 y.o. male.  Attending Physician: Staci Acosta, MD        Chief Complaint:    Chief Complaint   Patient presents with   . Wound Check               HPI:    Patient presents for a wound check from a prior cyst removal procedure. Patient reporting some itchiness, states that it may be from the dressing. Patient reports seeing scant serosanguinous drainage while in the shower, and a slight odor. Patient denies pain and soreness.    Here for follow-up infected sebaceous cyst.  He says the wick came out on the fifth.  Since then it is felt a little squishy under the incision and he has been able to express some material that is foul-smelling.  The area is not painful.            Problem List:    Patient Active Problem List   Diagnosis   . Transient global amnesia   . Cardiomyopathy   . Type 2 diabetes mellitus   . Lumbar stenosis with neurogenic claudication   . Neuropathy   . Paroxysmal atrial fibrillation   . Anemia   . Basal cell carcinoma of skin   . Benign prostatic hyperplasia with urinary obstruction   . Essential hypertension   . History of cerebrovascular accident   . Hyperlipidemia   . Low back pain   . Numbness of hand   . Peripheral vascular disease   . Well adult health check   . Infected sebaceous cyst             Current Meds:    Outpatient Medications Marked as Taking for the 05/16/20 encounter (Office Visit) with Staci Acosta, MD   Medication Sig Dispense Refill   . acetaminophen (TYLENOL) 500 MG tablet Take 500 mg by mouth every 6 (six) hours as needed for Pain     . amLODIPine (NORVASC) 5 MG tablet Take 1 tablet (5 mg total) by mouth daily 90 tablet 1   . atorvastatin (LIPITOR) 10 MG tablet Take 1 tablet (10 mg total) by mouth daily 90 tablet 1   . CALCIUM PO Take 1,000 mg by mouth daily     . glimepiride (AMARYL) 2 MG tablet Take 1 tablet (2 mg  total) by mouth every morning 90 tablet 1   . glucose blood test strip Accu-Chek Aviva Plus test strips     . Lancets (accu-chek multiclix) lancets Accu-Chek Softclix Lancets     . lisinopril (ZESTRIL) 20 MG tablet Take 1 tablet (20 mg total) by mouth daily 90 tablet 1   . metFORMIN (GLUCOPHAGE) 1000 MG tablet TAKE 1 TABLET BY MOUTH TWICE DAILY 180 tablet 1   . metoprolol tartrate (LOPRESSOR) 50 MG tablet Take 1 tablet (50 mg total) by mouth daily 90 tablet 0   . Multiple Vitamin (MULTIVITAMIN ADULT PO) Take by mouth daily     . rosuvastatin (CRESTOR) 5 MG tablet TAKE 1 TABLET BY MOUTH ONCE DAILY 90 tablet 1   . tamsulosin (FLOMAX) 0.4 MG Cap Take 0.4 mg by mouth 2 (two) times daily  Allergies:    Allergies   Allergen Reactions   . Linezolid Nausea Only, Other (See Comments) and Anaphylaxis     weakness  Decreased appetite, nausea, fatigue.  Decreased appetite, nausea, fatigue.               Past Surgical History:    Past Surgical History:   Procedure Laterality Date   . BACK SURGERY  2017    scoliosis and stenosis - 3 surgeries total   . KNEE SURGERY Left 10/08/1978    medial meniscectomy           Family History:    Family History   Problem Relation Age of Onset   . Diabetes Mother    . Heart disease Father    . Stroke Father    . Diabetes Father            Social History:    Social History     Tobacco Use   . Smoking status: Former Smoker     Packs/day: 0.50     Years: 50.00     Pack years: 25.00     Quit date: 2014     Years since quitting: 7.6   . Smokeless tobacco: Never Used   Vaping Use   . Vaping Use: Never used   Substance Use Topics   . Alcohol use: Not Currently     Alcohol/week: 0.0 standard drinks   . Drug use: Never          The following sections were reviewed this encounter by the provider:            Vital Signs:    BP 124/60 (BP Site: Left arm, Patient Position: Sitting, Cuff Size: Large)   Pulse (!) 45   Temp (!) 96.7 F (35.9 C) (Tympanic)   Wt 74.4 kg (164 lb)   SpO2 99%   BMI  23.53 kg/m          ROS:   Hormone level Review of Systems   See HPI        Physical Exam:    Physical Exam  Vitals and nursing note reviewed.   Constitutional:       Appearance: Normal appearance. He is not ill-appearing.   Pulmonary:      Effort: Pulmonary effort is normal. No respiratory distress.   Skin:     Comments: The incision in the left mid chest is almost closed but I can express a little bit of sebaceous material through it.  There is a small area of fluctuance below and medial to the incision.  The area is not erythematous or tender.   Neurological:      Mental Status: He is alert.   Psychiatric:         Mood and Affect: Mood normal.         Behavior: Behavior normal.              Assessment:    1. Infected sebaceous cyst            Plan:    The cyst infection has resolved but he started to reaccumulate with sebaceous material in the lower pocket of the cyst.  Recommend local anesthesia, reopening the incision and repacking of the wound.  Patient agrees to procedure.  The 1 mL of lidocaine was injected around the deeper parts of the wound.  The wound was opened with a needle holder and the cyst pocket explored and adhesions broken up.  Sebaceous  material was expressed until no more could be expressed and the wound cleaned with a gauze pad wound around the needle holders.  The wound was repacked with 1/4 inch plain gauze.  A dry sterile dressing was applied.  Please change the dressing daily but do not remove any packing.  Follow-up with me on September 13 (appointment made).  Follow-up sooner for problems.            Follow-up:    No follow-ups on file.         Staci Acosta, MD

## 2020-05-20 ENCOUNTER — Encounter (INDEPENDENT_AMBULATORY_CARE_PROVIDER_SITE_OTHER): Payer: Self-pay | Admitting: Family Medicine

## 2020-05-20 ENCOUNTER — Ambulatory Visit (INDEPENDENT_AMBULATORY_CARE_PROVIDER_SITE_OTHER): Payer: No Typology Code available for payment source | Admitting: Family Medicine

## 2020-05-20 VITALS — BP 118/62 | HR 45 | Temp 97.9°F | Ht 70.0 in | Wt 164.0 lb

## 2020-05-20 DIAGNOSIS — L723 Sebaceous cyst: Secondary | ICD-10-CM

## 2020-05-20 DIAGNOSIS — L089 Local infection of the skin and subcutaneous tissue, unspecified: Secondary | ICD-10-CM

## 2020-05-20 NOTE — Progress Notes (Signed)
[facility] [place] [facility]                       Date of Exam: 05/20/2020 10:11 PM        Patient ID: Brian Pena is a 74 y.o. male.  Attending Physician: Staci Acosta, MD        Chief Complaint:    Chief Complaint   Patient presents with   . Abscess Re-check               HPI:    74 y/o male presents to clinic for abscess re-check.     Here for follow-up on sebaceous cyst abscess on left anterior chest. At his last visit on the ninth had to reopen the wound because of the development of another fluid collection. Bloody sebaceous material was expressed and a wick reinserted. He has the change the dressing over the weekend but I asked him not to pull the wick out until he was evaluated today.            Problem List:    Patient Active Problem List   Diagnosis   . Transient global amnesia   . Cardiomyopathy   . Type 2 diabetes mellitus   . Lumbar stenosis with neurogenic claudication   . Neuropathy   . Paroxysmal atrial fibrillation   . Anemia   . Basal cell carcinoma of skin   . Benign prostatic hyperplasia with urinary obstruction   . Essential hypertension   . History of cerebrovascular accident   . Hyperlipidemia   . Low back pain   . Numbness of hand   . Peripheral vascular disease   . Well adult health check   . Infected sebaceous cyst             Current Meds:    Outpatient Medications Marked as Taking for the 05/20/20 encounter (Office Visit) with Staci Acosta, MD   Medication Sig Dispense Refill   . acetaminophen (TYLENOL) 500 MG tablet Take 500 mg by mouth every 6 (six) hours as needed for Pain     . amLODIPine (NORVASC) 5 MG tablet Take 1 tablet (5 mg total) by mouth daily 90 tablet 1   . CALCIUM PO Take 1,000 mg by mouth daily     . glimepiride (AMARYL) 2 MG tablet Take 1 tablet (2 mg total) by mouth every morning 90 tablet 1   . glucose blood test strip Accu-Chek Aviva Plus test strips     . Lancets (accu-chek multiclix) lancets Accu-Chek Softclix Lancets     .  lisinopril (ZESTRIL) 20 MG tablet Take 1 tablet (20 mg total) by mouth daily 90 tablet 1   . metFORMIN (GLUCOPHAGE) 1000 MG tablet TAKE 1 TABLET BY MOUTH TWICE DAILY 180 tablet 1   . metoprolol tartrate (LOPRESSOR) 50 MG tablet Take 1 tablet (50 mg total) by mouth daily 90 tablet 0   . Multiple Vitamin (MULTIVITAMIN ADULT PO) Take by mouth daily     . rosuvastatin (CRESTOR) 5 MG tablet TAKE 1 TABLET BY MOUTH ONCE DAILY 90 tablet 1   . tamsulosin (FLOMAX) 0.4 MG Cap Take 0.4 mg by mouth 2 (two) times daily            Allergies:    Allergies   Allergen Reactions   . Linezolid Nausea Only, Other (See Comments) and Anaphylaxis     weakness  Decreased appetite, nausea, fatigue.  Decreased appetite, nausea, fatigue.  Past Surgical History:    Past Surgical History:   Procedure Laterality Date   . BACK SURGERY  2017    scoliosis and stenosis - 3 surgeries total   . KNEE SURGERY Left 10/08/1978    medial meniscectomy           Family History:    Family History   Problem Relation Age of Onset   . Diabetes Mother    . Heart disease Father    . Stroke Father    . Diabetes Father            Social History:    Social History     Tobacco Use   . Smoking status: Former Smoker     Packs/day: 0.50     Years: 50.00     Pack years: 25.00     Quit date: 2014     Years since quitting: 7.7   . Smokeless tobacco: Never Used   Vaping Use   . Vaping Use: Never used   Substance Use Topics   . Alcohol use: Not Currently     Alcohol/week: 0.0 standard drinks   . Drug use: Never          The following sections were reviewed this encounter by the provider:            Vital Signs:    BP 118/62 (BP Site: Right arm, Patient Position: Sitting, Cuff Size: Medium)   Pulse (!) 45 Comment: Patient reports WNL  Temp 97.9 F (36.6 C) (Tympanic)   Ht 1.778 m (5\' 10" )   Wt 74.4 kg (164 lb)   SpO2 98%   BMI 23.53 kg/m          ROS:    Review of Systems   See HPI        Physical Exam:    Physical Exam  Vitals and nursing note reviewed.    Constitutional:       Appearance: Normal appearance. He is not ill-appearing.   Pulmonary:      Effort: Pulmonary effort is normal. No respiratory distress.   Skin:     Comments: There is no induration around the incision. Approximately 3 cm of wick was removed.   Neurological:      Mental Status: He is alert.   Psychiatric:         Mood and Affect: Mood normal.         Behavior: Behavior normal.              Assessment:    1. Infected sebaceous cyst            Plan:    The wick was partially removed. He may return to removing an inch at a time and will follow up on the 17th.          Follow-up:    Appointment scheduled for September 17th.        Staci Acosta, MD

## 2020-05-24 ENCOUNTER — Ambulatory Visit (INDEPENDENT_AMBULATORY_CARE_PROVIDER_SITE_OTHER): Payer: No Typology Code available for payment source | Admitting: Family Medicine

## 2020-05-24 ENCOUNTER — Encounter (INDEPENDENT_AMBULATORY_CARE_PROVIDER_SITE_OTHER): Payer: Self-pay | Admitting: Family Medicine

## 2020-05-24 VITALS — BP 92/60 | HR 86 | Temp 97.7°F | Ht 70.0 in | Wt 164.0 lb

## 2020-05-24 DIAGNOSIS — L723 Sebaceous cyst: Secondary | ICD-10-CM

## 2020-05-24 NOTE — Progress Notes (Signed)
[facility] [place] [facility]                       Date of Exam: 05/24/2020 12:12 PM        Patient ID: Brian Pena is a 74 y.o. male.  Attending Physician: Staci Acosta, MD        Chief Complaint:    Chief Complaint   Patient presents with   . Follow-up     Cyst               HPI:    Brian Pena presents today for follow sebaceus cyst.    Here to follow-up sebaceous cyst.  I had previously treated this in August and it was doing well and then started to recur.  I opened the wound again a week ago and cleaned out the debris that was there.  Put another week and and he has been removing it since Monday the 13th.  He came out a couple of days ago when the area has healed up and looks good.  He had a little bit of drainage yesterday but none at all today.    He plans on getting his flu shot in October.  He has been going to the pharmacy to get this for the last 15 years and will continue that pattern.            Problem List:    Patient Active Problem List   Diagnosis   . Transient global amnesia   . Cardiomyopathy   . Type 2 diabetes mellitus   . Lumbar stenosis with neurogenic claudication   . Neuropathy   . Paroxysmal atrial fibrillation   . Anemia   . Basal cell carcinoma of skin   . Benign prostatic hyperplasia with urinary obstruction   . Essential hypertension   . History of cerebrovascular accident   . Hyperlipidemia   . Low back pain   . Numbness of hand   . Peripheral vascular disease   . Well adult health check   . Infected sebaceous cyst             Current Meds:    Outpatient Medications Marked as Taking for the 05/24/20 encounter (Office Visit) with Staci Acosta, MD   Medication Sig Dispense Refill   . acetaminophen (TYLENOL) 500 MG tablet Take 500 mg by mouth every 6 (six) hours as needed for Pain     . amLODIPine (NORVASC) 5 MG tablet Take 1 tablet (5 mg total) by mouth daily 90 tablet 1   . CALCIUM PO Take 1,000 mg by mouth daily     . glimepiride (AMARYL) 2 MG tablet  Take 1 tablet (2 mg total) by mouth every morning 90 tablet 1   . glucose blood test strip Accu-Chek Aviva Plus test strips     . Lancets (accu-chek multiclix) lancets Accu-Chek Softclix Lancets     . lisinopril (ZESTRIL) 20 MG tablet Take 1 tablet (20 mg total) by mouth daily 90 tablet 1   . metFORMIN (GLUCOPHAGE) 1000 MG tablet TAKE 1 TABLET BY MOUTH TWICE DAILY 180 tablet 1   . metoprolol tartrate (LOPRESSOR) 50 MG tablet Take 1 tablet (50 mg total) by mouth daily 90 tablet 0   . Multiple Vitamin (MULTIVITAMIN ADULT PO) Take by mouth daily     . rosuvastatin (CRESTOR) 5 MG tablet TAKE 1 TABLET BY MOUTH ONCE DAILY 90 tablet 1   . tamsulosin (FLOMAX) 0.4  MG Cap Take 0.4 mg by mouth 2 (two) times daily     . [DISCONTINUED] atorvastatin (LIPITOR) 10 MG tablet Take 1 tablet (10 mg total) by mouth daily 90 tablet 1          Allergies:    Allergies   Allergen Reactions   . Linezolid Nausea Only, Other (See Comments) and Anaphylaxis     weakness  Decreased appetite, nausea, fatigue.  Decreased appetite, nausea, fatigue.               Past Surgical History:    Past Surgical History:   Procedure Laterality Date   . BACK SURGERY  2017    scoliosis and stenosis - 3 surgeries total   . KNEE SURGERY Left 10/08/1978    medial meniscectomy           Family History:    Family History   Problem Relation Age of Onset   . Diabetes Mother    . Heart disease Father    . Stroke Father    . Diabetes Father            Social History:    Social History     Tobacco Use   . Smoking status: Former Smoker     Packs/day: 0.50     Years: 50.00     Pack years: 25.00     Quit date: 2014     Years since quitting: 7.7   . Smokeless tobacco: Never Used   Vaping Use   . Vaping Use: Never used   Substance Use Topics   . Alcohol use: Not Currently     Alcohol/week: 0.0 standard drinks   . Drug use: Never          The following sections were reviewed this encounter by the provider:   Tobacco  Allergies  Meds  Problems  Med Hx  Surg Hx  Fam Hx              Vital Signs:    BP 92/60   Pulse 86   Temp 97.7 F (36.5 C)   Ht 1.778 m (5\' 10" )   Wt 74.4 kg (164 lb)   SpO2 97%   BMI 23.53 kg/m          ROS:    Review of Systems   See HPI        Physical Exam:    Physical Exam  Vitals and nursing note reviewed.   Constitutional:       Appearance: Normal appearance. He is not ill-appearing.   Pulmonary:      Effort: Pulmonary effort is normal. No respiratory distress.   Skin:     Comments: There is a 1 cm well-healed incision in the left upper chest.  There is a little firmness around it consistent with scar tissue but no erythema, warmth or drainage.   Neurological:      Mental Status: He is alert.   Psychiatric:         Mood and Affect: Mood normal.         Behavior: Behavior normal.              Assessment:    1. Sebaceous cyst  - Ambulatory referral to General Surgery            Plan:    I had previously sent him to a local surgeon but he did not feel comfortable with the interaction in the office and they recommended surgery  in [place].  I done a referral for him to see Dr. Zollie Beckers or one of the docs in [place].  I would suggest getting established with them so that if the cyst starts to recur he can call them and have it excised.  Follow-up as needed.          Follow-up:    No follow-ups on file.         Staci Acosta, MD

## 2020-05-28 ENCOUNTER — Other Ambulatory Visit (INDEPENDENT_AMBULATORY_CARE_PROVIDER_SITE_OTHER): Payer: Self-pay | Admitting: Family Medicine

## 2020-05-28 DIAGNOSIS — E1142 Type 2 diabetes mellitus with diabetic polyneuropathy: Secondary | ICD-10-CM

## 2020-06-15 ENCOUNTER — Other Ambulatory Visit (INDEPENDENT_AMBULATORY_CARE_PROVIDER_SITE_OTHER): Payer: Self-pay | Admitting: Family Medicine

## 2020-06-15 DIAGNOSIS — I10 Essential (primary) hypertension: Secondary | ICD-10-CM

## 2020-07-05 ENCOUNTER — Other Ambulatory Visit (INDEPENDENT_AMBULATORY_CARE_PROVIDER_SITE_OTHER): Payer: Self-pay | Admitting: Family Medicine

## 2020-07-05 DIAGNOSIS — I10 Essential (primary) hypertension: Secondary | ICD-10-CM

## 2020-07-16 ENCOUNTER — Other Ambulatory Visit (INDEPENDENT_AMBULATORY_CARE_PROVIDER_SITE_OTHER): Payer: Self-pay | Admitting: Family Medicine

## 2020-07-16 DIAGNOSIS — I10 Essential (primary) hypertension: Secondary | ICD-10-CM

## 2020-07-16 NOTE — Telephone Encounter (Signed)
90 tablets / 0 refills   metoprolol tartrate (LOPRESSOR) 50 MG tablet    Last OV:   -05/24/2020 (dx: sebaceous cyst)  -11/06/2019 (dx: type 2 DM + 11 more, essential hypertension)    Dr Elwyn Lade - please assess.    [facility] - 74yo is due for 6 month medication review. Please call to schedule.     Thank you!

## 2020-07-18 NOTE — Telephone Encounter (Signed)
Scheduled for 12/2

## 2020-07-19 ENCOUNTER — Other Ambulatory Visit (INDEPENDENT_AMBULATORY_CARE_PROVIDER_SITE_OTHER): Payer: Self-pay | Admitting: Family Medicine

## 2020-07-19 DIAGNOSIS — I10 Essential (primary) hypertension: Secondary | ICD-10-CM

## 2020-07-19 NOTE — Telephone Encounter (Signed)
30 tablets / 0 refills   lisinopril (ZESTRIL) 20 MG tablet    Last OV:   -05/24/2020 (dx: sebaceous cyst)  -11/06/2019 (dx: type 2 DM + 11 more, essential hypertension)    Dr Elwyn Lade - please assess. Patient is scheduled for 08/08/2020.    Thank you!

## 2020-08-08 ENCOUNTER — Encounter (INDEPENDENT_AMBULATORY_CARE_PROVIDER_SITE_OTHER): Payer: Self-pay | Admitting: Family Medicine

## 2020-08-08 ENCOUNTER — Ambulatory Visit (INDEPENDENT_AMBULATORY_CARE_PROVIDER_SITE_OTHER): Payer: No Typology Code available for payment source | Admitting: Family Medicine

## 2020-08-08 VITALS — BP 108/70 | HR 90 | Temp 98.4°F | Wt 162.4 lb

## 2020-08-08 DIAGNOSIS — I480 Paroxysmal atrial fibrillation: Secondary | ICD-10-CM

## 2020-08-08 DIAGNOSIS — Z23 Encounter for immunization: Secondary | ICD-10-CM

## 2020-08-08 DIAGNOSIS — R3915 Urgency of urination: Secondary | ICD-10-CM | POA: Insufficient documentation

## 2020-08-08 DIAGNOSIS — N3000 Acute cystitis without hematuria: Secondary | ICD-10-CM

## 2020-08-08 DIAGNOSIS — I428 Other cardiomyopathies: Secondary | ICD-10-CM

## 2020-08-08 DIAGNOSIS — M48062 Spinal stenosis, lumbar region with neurogenic claudication: Secondary | ICD-10-CM

## 2020-08-08 DIAGNOSIS — E1142 Type 2 diabetes mellitus with diabetic polyneuropathy: Secondary | ICD-10-CM

## 2020-08-08 DIAGNOSIS — I10 Essential (primary) hypertension: Secondary | ICD-10-CM

## 2020-08-08 DIAGNOSIS — I739 Peripheral vascular disease, unspecified: Secondary | ICD-10-CM

## 2020-08-08 DIAGNOSIS — E782 Mixed hyperlipidemia: Secondary | ICD-10-CM

## 2020-08-08 LAB — POCT URINALYSIS DIPSTIX (10)(MULTI-TEST)
Bilirubin, UA POCT: NEGATIVE
Glucose, UA POCT: NEGATIVE mg/dL
Ketones, UA POCT: NEGATIVE mg/dL
Nitrite, UA POCT: NEGATIVE
POCT Spec Gravity, UA: 1.03 (ref 1.001–1.035)
POCT pH, UA: 6 (ref 5–8)
Protein, UA POCT: 30 mg/dL — AB
Urobilinogen, UA: 0.2 mg/dL

## 2020-08-08 MED ORDER — CEFUROXIME AXETIL 250 MG PO TABS
250.0000 mg | ORAL_TABLET | Freq: Two times a day (BID) | ORAL | 0 refills | Status: AC
Start: 2020-08-08 — End: 2020-08-18

## 2020-08-08 NOTE — Progress Notes (Signed)
[facility] [place] [facility]                       Date of Exam: 08/08/2020 6:01 AM        Patient ID: Brian Pena is a 74 y.o. male.  Attending Physician: Staci Acosta, MD        Chief Complaint:    Chief Complaint   Patient presents with   . Diabetes     6 months chronic follow up                HPI:    HPI     Here for routine follow-up, overall doing pretty well but the last 2 days he is developed a urgency that is quite intense during the day and can cause him to become incontinent.  The nurse practitioner at the urologist office put him on Flomax and has helped his urination a lot.  He is only getting up at night one time, sometimes not at all.  He is able to sleep okay but during the day he is going every 30 minutes to 2 hours and cannot always make it to the bathroom.  His last visit with the spine surgeon was about 8 months ago.  He feels like his legs are stable there has not been a progression in his spinal stenosis from that standpoint.  No chest pain, palpitations or shortness of breath. His chart contains a diagnosis of cardiomyopathy first noted in 2013. This report comes in from care everywhere, from his cardiologist in [place]. He does not remember ever having had that diagnosis. He has not seen a cardiologist in a while  No nausea, vomiting or diarrhea.  The sebaceous cyst that we treated over the summer has healed up.            Problem List:    Patient Active Problem List   Diagnosis   . Transient global amnesia   . Cardiomyopathy   . Type 2 diabetes mellitus   . Lumbar stenosis with neurogenic claudication   . Neuropathy   . Paroxysmal atrial fibrillation   . Anemia   . Basal cell carcinoma of skin   . Benign prostatic hyperplasia with urinary obstruction   . Essential hypertension   . History of cerebrovascular accident   . Hyperlipidemia   . Low back pain   . Numbness of hand   . Peripheral vascular disease   . Well adult health check   . Infected  sebaceous cyst   . Urinary urgency             Current Meds:    Outpatient Medications Marked as Taking for the 08/08/20 encounter (Office Visit) with Staci Acosta, MD   Medication Sig Dispense Refill   . acetaminophen (TYLENOL) 500 MG tablet Take 500 mg by mouth every 6 (six) hours as needed for Pain     . amLODIPine (NORVASC) 5 MG tablet TAKE 1 TABLET(5 MG) BY MOUTH DAILY 90 tablet 1   . CALCIUM PO Take 1,000 mg by mouth daily     . glimepiride (AMARYL) 2 MG tablet TAKE 1 TABLET(2 MG) BY MOUTH EVERY MORNING 90 tablet 1   . glucose blood test strip Accu-Chek Aviva Plus test strips     . ibuprofen (ADVIL) 200 MG tablet Take 200 mg by mouth as needed for Pain     . Lancets (accu-chek multiclix) lancets Accu-Chek Softclix Lancets     .  lisinopril (ZESTRIL) 20 MG tablet TAKE 1 TABLET(20 MG) BY MOUTH DAILY 30 tablet 0   . metFORMIN (GLUCOPHAGE) 1000 MG tablet TAKE 1 TABLET BY MOUTH TWICE DAILY 180 tablet 1   . metoprolol tartrate (LOPRESSOR) 50 MG tablet TAKE 1 TABLET(50 MG) BY MOUTH DAILY 90 tablet 0   . Multiple Vitamin (MULTIVITAMIN ADULT PO) Take by mouth daily     . rosuvastatin (CRESTOR) 5 MG tablet TAKE 1 TABLET BY MOUTH ONCE DAILY 90 tablet 1   . tamsulosin (FLOMAX) 0.4 MG Cap Take 0.4 mg by mouth 2 (two) times daily            Allergies:    Allergies   Allergen Reactions   . Linezolid Nausea Only, Other (See Comments) and Anaphylaxis     weakness  Decreased appetite, nausea, fatigue.  Decreased appetite, nausea, fatigue.               Past Surgical History:    Past Surgical History:   Procedure Laterality Date   . BACK SURGERY  2017    scoliosis and stenosis - 3 surgeries total   . KNEE SURGERY Left 10/08/1978    medial meniscectomy           Family History:    Family History   Problem Relation Age of Onset   . Diabetes Mother    . Heart disease Father    . Stroke Father    . Diabetes Father            Social History:    Social History     Tobacco Use   . Smoking status: Former Smoker     Packs/day: 0.50      Years: 50.00     Pack years: 25.00     Quit date: 2014     Years since quitting: 7.9   . Smokeless tobacco: Never Used   Vaping Use   . Vaping Use: Never used   Substance Use Topics   . Alcohol use: Not Currently     Alcohol/week: 0.0 standard drinks   . Drug use: Never          The following sections were reviewed this encounter by the provider:            Vital Signs:    BP 108/70 (BP Site: Right arm, Patient Position: Sitting, Cuff Size: Medium)   Pulse 90   Temp 98.4 F (36.9 C) (Tympanic)   Wt 73.7 kg (162 lb 6.4 oz)   SpO2 98%   BMI 23.30 kg/m          ROS:    Review of Systems   See HPI        Physical Exam:    Physical Exam  Vitals and nursing note reviewed.   Constitutional:       Appearance: Normal appearance. He is not ill-appearing.   Cardiovascular:      Rate and Rhythm: Normal rate and regular rhythm.      Heart sounds: Normal heart sounds. No murmur heard.      Pulmonary:      Effort: Pulmonary effort is normal. No respiratory distress.      Breath sounds: No wheezing or rales.   Neurological:      Mental Status: He is alert.      Gait: Gait abnormal.      Comments: Gait is slow, currently using a Rollator to aid ambulation.   Psychiatric:  Mood and Affect: Mood normal.         Behavior: Behavior normal.              Assessment:    1. Mixed hyperlipidemia  - Comprehensive metabolic panel  - Lipid panel    2. Essential hypertension  - Comprehensive metabolic panel    3. Type 2 diabetes mellitus with diabetic polyneuropathy, without long-term current use of insulin  - Comprehensive metabolic panel  - Hemoglobin A1C    4. Peripheral vascular disease    5. Paroxysmal atrial fibrillation    6. Lumbar stenosis with neurogenic claudication    7. Urinary urgency  - POCT UA Dipstix (10)(Multi-Test)  - Urine culture (IL/LC/QU)    8. Other cardiomyopathy  - Ambulatory referral to Cardiology    9. Acute cystitis without hematuria  - cefuroxime (CEFTIN) 250 MG tablet; Take 1 tablet (250 mg total)  by mouth 2 (two) times daily for 10 days  Dispense: 20 tablet; Refill: 0    10. Immunization due  - COVID-19 mRNA vaccine preservative free 0.3 mL (PFIZER)            Plan:    Suspect urinary urgency may be related to UTI. Will get urine culture and start antibiotics pending results. Please let me know if not feeling better.  Time to update labs for his blood pressure, cholesterol and diabetes.  His chart contained the diagnosis of cardiomyopathy first noted in 2013. He has not seen a cardiologist in a while. Will have him see cardiology to clarify current status of cardiomyopathy.  He will continue to follow-up with his spine surgeon regarding his spinal stenosis.  Covid booster done today.  Follow-up office visit in six months.          Follow-up:    Return in about 6 months (around 02/06/2021) for follow-up diabetes.         Staci Acosta, MD

## 2020-08-13 LAB — URINE CULTURE

## 2020-08-14 NOTE — Progress Notes (Signed)
Dear Mr. Brian Pena,  Your urine culture is positive. The bacteria is sensitive to penicillin so it should also be sensitive to cefuroxime. Please let me know if you are not feeling better with treatment.  Dr. Elwyn Lade

## 2020-08-15 LAB — COMPREHENSIVE METABOLIC PANEL
ALT: 12 IU/L (ref 0–44)
AST (SGOT): 18 IU/L (ref 0–40)
African American eGFR: 92 mL/min/{1.73_m2} (ref >59)
Albumin/Globulin Ratio: 2 (ref 1.2–2.2)
Albumin: 4.3 g/dL (ref 3.7–4.7)
Alkaline Phosphatase: 66 IU/L (ref 44–121)
BUN / Creatinine Ratio: 20 (ref 10–24)
BUN: 19 mg/dL (ref 8–27)
Bilirubin, Total: 0.5 mg/dL (ref 0.0–1.2)
CO2: 24 mmol/L (ref 20–29)
Calcium: 9.3 mg/dL (ref 8.6–10.2)
Chloride: 107 mmol/L — ABNORMAL HIGH (ref 96–106)
Creatinine: 0.93 mg/dL (ref 0.76–1.27)
Globulin, Total: 2.1 g/dL (ref 1.5–4.5)
Glucose: 104 mg/dL — ABNORMAL HIGH (ref 65–99)
Potassium: 4.3 mmol/L (ref 3.5–5.2)
Protein, Total: 6.4 g/dL (ref 6.0–8.5)
Sodium: 143 mmol/L (ref 134–144)
non-African American eGFR: 79 mL/min/{1.73_m2} (ref >59)

## 2020-08-15 LAB — LIPID PANEL
Cholesterol / HDL Ratio: 3.5 ratio (ref 0.0–5.0)
Cholesterol: 133 mg/dL (ref 100–199)
HDL: 38 mg/dL — ABNORMAL LOW (ref >39)
LDL Chol Calculated (NIH): 69 mg/dL (ref 0–99)
Triglycerides: 148 mg/dL (ref 0–149)
VLDL Calculated: 26 mg/dL (ref 5–40)

## 2020-08-15 LAB — HEMOGLOBIN A1C: Hemoglobin A1C: 6.2 % — ABNORMAL HIGH (ref 4.8–5.6)

## 2020-08-23 NOTE — Progress Notes (Signed)
Dear Mr. Brian Pena,  Dear blood chemistries are normal except for the sugar 104, which is good and the slightly elevated chloride which is not a clinically significant finding. Their cholesterol is well-controlled. Your hemoglobin A1c shows excellent control of your diabetes. Let me know if you have any questions.  Dr. Elwyn Lade

## 2020-08-28 ENCOUNTER — Encounter (INDEPENDENT_AMBULATORY_CARE_PROVIDER_SITE_OTHER): Payer: Self-pay | Admitting: Family Medicine

## 2020-09-27 NOTE — Progress Notes (Signed)
[place] [facility] OFFICE CONSULTATION NOTE    HRT [facility] OFFICE -CARDIOLOGY  [address]  [place] [address]  Dept: [phone]  Dept Fax: [phone]         Patient Name: Brian Pena, Brian Pena    Date of Visit:  October 01, 2020  Date of Birth: 03-Jun-1946  AGE: 75 y.o.  Medical Record #: [id number]  Requesting Physician: Staci Acosta, MD      CHIEF COMPLAINT:  Atrial Fibrillation      HISTORY OF PRESENT ILLNESS    Mr. Fulfer is being seen today for cardiovascular evaluation at the request of Staci Acosta, MD. He is a pleasant 75 y.o. male who cv risks,  Af.    Rosanne Ashing was seen in December 2018 as part of a preoperative assessment for repeat back surgery.  He is a retired Doctor, general practice  He was last seen in 2018 and has been referred back to [place] by Dr. Elwyn Lade to reestablish care in the context of his A. Fib and risk.    He is very limited in his activity due to peripheral neuropathy bilateral knee issues and spine issues and uses a Rollator.  No chest pain, tightness or heaviness.  No significant shortness of breath PND orthopnea peripheral edema.  He is unaware of his atrial fibrillation and has not been aware for some time although he previously knew his about his A. Fib.  He was on oral anticoagulant after his stroke for about 5 years but discontinue it for unclear reasons, he just stopped it.    PAST MEDICAL HISTORY: He has a past medical history of Anemia, Cerebrovascular accident, Diabetes mellitus, Hyperlipidemia, and Hypertension. He has a past surgical history that includes Knee surgery (Left, 10/08/1978); Back surgery (2017); and Cervical spine surgery (2019).    ALLERGIES:   Allergies   Allergen Reactions   . Linezolid Nausea Only, Other (See Comments) and Anaphylaxis     weakness  Decreased appetite, nausea, fatigue.  Decreased appetite, nausea, fatigue.         MEDICATIONS:   Current Outpatient Medications   Medication Sig   .  acetaminophen (TYLENOL) 500 MG tablet Take 500 mg by mouth every 6 (six) hours as needed for Pain   . amLODIPine (NORVASC) 5 MG tablet TAKE 1 TABLET(5 MG) BY MOUTH DAILY   . CALCIUM PO Take 1,000 mg by mouth daily   . glimepiride (AMARYL) 2 MG tablet TAKE 1 TABLET(2 MG) BY MOUTH EVERY MORNING   . glucose blood test strip Accu-Chek Aviva Plus test strips   . ibuprofen (ADVIL) 200 MG tablet Take 200 mg by mouth as needed for Pain   . Lancets (accu-chek multiclix) lancets Accu-Chek Softclix Lancets   . lisinopril (ZESTRIL) 20 MG tablet TAKE 1 TABLET(20 MG) BY MOUTH DAILY   . metFORMIN (GLUCOPHAGE) 1000 MG tablet TAKE 1 TABLET BY MOUTH TWICE DAILY   . metoprolol tartrate (LOPRESSOR) 50 MG tablet TAKE 1 TABLET(50 MG) BY MOUTH DAILY   . Multiple Vitamin (MULTIVITAMIN ADULT PO) Take by mouth daily   . raNITIdine HCl (RANITIDINE 75 PO) Take 150 mg by mouth daily   . rosuvastatin (CRESTOR) 5 MG tablet TAKE 1 TABLET BY MOUTH ONCE DAILY   . tamsulosin (FLOMAX) 0.4 MG Cap Take 0.4 mg by mouth 2 (two) times daily        FAMILY HISTORY: family history includes Diabetes in his father and mother; Heart disease in his  father; Stroke in his father.    SOCIAL HISTORY: He reports that he quit smoking about 8 years ago. He has a 25.00 pack-year smoking history. He has never used smokeless tobacco. He reports previous alcohol use. He reports that he does not use drugs.    REVIEW OF SYSTEMS:   General: Denies recent weight loss, weight gain, fever or chills or change in exercise tolerance.;   Integumentary: Denies any change in hair or nails, rashes, or skin lesions.;   Eyes: Denies diplopia, glaucoma or visual field defects.;   Ears, Nose, Throat, Mouth: Denies any hearing loss, epistaxis, hoarseness or difficulty speaking.;  Respiratory: Denies dyspnea, cough, wheezing or hemoptysis.;   Cardiovascular: Please review HPI;   Abdominal : Denies ulcer disease, hematochezia or melena.;  Musculoskeletal:Denies any venous insufficiency,  arthritic symptoms or back problems.;   Neurological : Denies any recurrent strokes, TIA, or seizure disorder.;   Psychiatric: Denies any depression, substance abuse or change in cognitive functions.;   Endocrine: Denies any weight change, heat/cold intolerance, polydipsia, or polyuria;   Hematologic/Immunologic: Denies any food allergies, seasonal allergies, bleeding disorders.   All other systems reviewed and negative except as above.       PHYSICAL EXAMINATION    Visit Vitals  BP 110/64 (BP Site: Left arm, Patient Position: Sitting, Cuff Size: Medium)   Pulse 73   Ht 1.778 m (5\' 10" )   Wt 72.9 kg (160 lb 12.8 oz)   BMI 23.07 kg/m        General Appearance:  A well-appearing male in no acute distress.    Skin: Warm and dry to touch, no apparent skin lesions, or masses noted.  Head: Normocephalic, normal hair pattern, no masses or tenderness   Eyes: EOMS Intact, PERRL, conjunctivae and lids unremarkable.  ENT: Ears, Nose and throat reveal no gross abnormalities.  No pallor or cyanosis.  Neck: JVP normal, no carotid bruit, thyroid not enlarged   Chest: Clear to auscultation bilaterally with good air movement and respiratory effort and no wheezes, rales, or rhonchi   Cardiovascular: Irregularly irregular rhythm, S1 normal, S2 normal, No S3 or S4. Apical impulse not displaced. No murmur. No gallops or rubs detected   Abdomen: Soft, nontender, nondistended, with normoactive bowel sounds. No organomegaly.  No pulsatile masses, or bruits.   Extremities: Warm without edema. No clubbing, or cyanosis. All peripheral pulses are full and equal.   Neuro: Alert and oriented x3. No gross motor or sensory deficits noted, affect appropriate.        ECG:  AF/CVR      LABS:   Lab Results   Component Value Date    WBC 5.6 11/17/2019    HGB 12.2 (L) 11/17/2019    HCT 36.9 (L) 11/17/2019    PLT 171 11/17/2019     Lab Results   Component Value Date    GLU 104 (H) 08/14/2020    BUN 19 08/14/2020    CREAT 0.93 08/14/2020    NA 143  08/14/2020    K 4.3 08/14/2020    CL 107 (H) 08/14/2020    CO2 24 08/14/2020    AST 18 08/14/2020    ALT 12 08/14/2020     Lab Results   Component Value Date    TSH 1.010 11/17/2019    HGBA1C 6.2 (H) 08/14/2020     Lab Results   Component Value Date    CHOL 133 08/14/2020    TRIG 148 08/14/2020    HDL 38 (L) 08/14/2020  LDL 69 08/14/2020           IMPRESSION:   Mr. Sevillano is a 75 y.o. male with the following problems:    1. 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 CHADS-VASc score at least 6  2. History of marked sinus bradycardia, in part iatrogenic from beta-blocker use  3. HTN  4. HLD  5. Pre-DM HgBA1C 6.2  6. Other medical issues:    A. Orthopedic issues: Recurrent back issues with prior surgeries; Bilateral knee osteoarthritis for which she has received shots in the past.    B. Prior tobacco use    C. Family medical history of heart disease      D. History of prior stroke per patient ~ 15+ yrs ago w/LUE weakness.      E. Peripheral neuropathy      F. Limited ability to walk: Uses a Rollator    G. Remote tobacco use having stopped many years ago.    Discussion: Rosanne Ashing seems to be doing reasonably well from a cardiac standpoint & does not really feel the A. Fib which may at this point will be permanent.  His rate today is controlled.  He has not been anticoagulated as he stopped his medicine about 5 years after his stroke.  I reviewed the risk benefits of alternatives using the [facility] AnticoagEvaluator app and he is now willing to resume.  The usual precautions risks benefits were discussed    RECOMMENDATIONS:  1.         Continue diet, encourage exercise as able and as directed.                            -Continued aggressive lifestyle changes.  2.         COVID-19 precautions discussed.     3.         Continue medicines as prescribed.      Begin Eliquis 5 mg p.o. twice daily with appropriate instructions, reduce NSAID and consider use of H2 blocker  4.         The  following tests have been recommended:     Echo    3 d Cardiac ambulatory monitor For A. Fib both to assess rates and whether he is going in and out of A. fib  5.         Office follow-up with     APP(NP/PA) ~ 1-2 months    Dr Clarita Crane cardiologist ~ 1 year  6.         Follow-up and copy of the above note to PCP and specialists.  7.         Low salt diet (2g Na), monitor bp.  Target <135/85.     This note was generated by the Epic system/Dragon speech recognition and may contain errors or omissions not intended by the user. Grammatical errors, random word insertions, deletions, pronoun errors, and incomplete sentences are occasional consequences of this technology due to software limitations. Not all errors are caught or corrected. If there are questions or concerns about the content of this note or information contained within the body of this dictation, they should be addressed directly with the author for clarification.  Orders Placed This Encounter   Procedures   . ECG 12 lead (Normal)       No orders of the defined types were placed in this encounter.        SIGNED:    Lamount Cranker, MD         This note was generated by the Dragon speech recognition and may contain errors or omissions not intended by the user. Grammatical errors, random word insertions, deletions, pronoun errors, and incomplete sentences are occasional consequences of this technology due to software limitations. Not all errors are caught or corrected. If there are questions or concerns about the content of this note or information contained within the body of this dictation, they should be addressed directly with the author for clarification.

## 2020-10-01 ENCOUNTER — Ambulatory Visit (INDEPENDENT_AMBULATORY_CARE_PROVIDER_SITE_OTHER): Payer: No Typology Code available for payment source | Admitting: Cardiology

## 2020-10-01 ENCOUNTER — Encounter (INDEPENDENT_AMBULATORY_CARE_PROVIDER_SITE_OTHER): Payer: Self-pay | Admitting: Cardiology

## 2020-10-01 VITALS — BP 110/64 | HR 73 | Ht 70.0 in | Wt 160.8 lb

## 2020-10-01 DIAGNOSIS — I10 Essential (primary) hypertension: Secondary | ICD-10-CM

## 2020-10-01 DIAGNOSIS — I428 Other cardiomyopathies: Secondary | ICD-10-CM

## 2020-10-01 DIAGNOSIS — I739 Peripheral vascular disease, unspecified: Secondary | ICD-10-CM

## 2020-10-01 DIAGNOSIS — E1142 Type 2 diabetes mellitus with diabetic polyneuropathy: Secondary | ICD-10-CM

## 2020-10-01 DIAGNOSIS — I480 Paroxysmal atrial fibrillation: Secondary | ICD-10-CM

## 2020-10-01 MED ORDER — APIXABAN 5 MG PO TABS
5.0000 mg | ORAL_TABLET | Freq: Two times a day (BID) | ORAL | 3 refills | Status: DC
Start: 2020-10-01 — End: 2021-03-30

## 2020-10-03 ENCOUNTER — Encounter (INDEPENDENT_AMBULATORY_CARE_PROVIDER_SITE_OTHER): Payer: Self-pay

## 2020-10-03 ENCOUNTER — Ambulatory Visit (INDEPENDENT_AMBULATORY_CARE_PROVIDER_SITE_OTHER): Payer: No Typology Code available for payment source

## 2020-10-03 DIAGNOSIS — I480 Paroxysmal atrial fibrillation: Secondary | ICD-10-CM

## 2020-10-03 NOTE — Progress Notes (Signed)
Patient enrolled online for ziopatch home delivery and flowsheet created. Patient messaged via MyChart with instructions.

## 2020-10-17 ENCOUNTER — Other Ambulatory Visit (INDEPENDENT_AMBULATORY_CARE_PROVIDER_SITE_OTHER): Payer: Self-pay | Admitting: Family Medicine

## 2020-10-17 DIAGNOSIS — E1142 Type 2 diabetes mellitus with diabetic polyneuropathy: Secondary | ICD-10-CM

## 2020-10-17 DIAGNOSIS — E782 Mixed hyperlipidemia: Secondary | ICD-10-CM

## 2020-10-17 DIAGNOSIS — I10 Essential (primary) hypertension: Secondary | ICD-10-CM

## 2020-10-17 NOTE — Telephone Encounter (Signed)
90 tablets, 0 refill pending for   - metoprolol tartrate (LOPRESSOR) 50 MG tablet  - amLODIPine (NORVASC) 5 MG tablet  - metFORMIN (GLUCOPHAGE) 1000 MG tablet  - rosuvastatin (CRESTOR) 5 MG tablet    Last OV - 08/08/20 and to f/u in 6 months    Dr. Elwyn Lade - Please review. Thank you!

## 2020-10-18 ENCOUNTER — Ambulatory Visit (INDEPENDENT_AMBULATORY_CARE_PROVIDER_SITE_OTHER): Payer: No Typology Code available for payment source | Admitting: Radiology

## 2020-10-18 DIAGNOSIS — I480 Paroxysmal atrial fibrillation: Secondary | ICD-10-CM

## 2020-10-20 ENCOUNTER — Encounter (INDEPENDENT_AMBULATORY_CARE_PROVIDER_SITE_OTHER): Payer: Self-pay | Admitting: Cardiology

## 2020-10-22 ENCOUNTER — Encounter (INDEPENDENT_AMBULATORY_CARE_PROVIDER_SITE_OTHER): Payer: No Typology Code available for payment source | Admitting: Cardiology

## 2020-10-22 ENCOUNTER — Encounter (INDEPENDENT_AMBULATORY_CARE_PROVIDER_SITE_OTHER): Payer: Self-pay | Admitting: Cardiology

## 2020-10-22 DIAGNOSIS — I480 Paroxysmal atrial fibrillation: Secondary | ICD-10-CM

## 2020-10-22 NOTE — Progress Notes (Signed)
CARDIAC AMBULATORY MONITOR REPORT    HRT [facility] OFFICE -CARDIOLOGY  [address]  [address]  [place] [address]  Dept: [phone]  Dept Fax: [phone]    NAME:  Brian Pena   SEX: Male   DOB:  06-01-1946 (75 y.o.)  MRN:  [id number]          STUDY DATE: 2/1-12/2020  INTERPRETATION DATE: 10/22/2020     REFERRING PHYSICIAN:  R Carlei Huang      INDICATION:   1. PAF    AMBULATORY MONITOR DETAILS:    Ambulatory monitor type: Zio XT Continuous Ambulatory Monitoring  Enrollment period:   3 days 1 hrs      SUMMARY OF CLINICAL FINDINGS    1-The underlying rhythm is sinus with rates varying from 35(during sleep) to 118.  Average heart rate is 51.  No significant pauses noted.  2-Occasional atrial ectopy accounting for 1.9% of beats.  No complex sustained symptomatic atrial arrhythmia noted.  No Afib seen  3-Rare ventricular ectopy (<1% beats) without complex sustained symptomatic ventricular arrhythmia noted.  4-No patient events or symptoms noted.    CONCLUSION:  1. Benign monitor without significant symptomatic sustained atrial or ventricular tachy or brady arrhythmias noted.    Results and recommendations discussed with the patient.  Reassurance, follow-up and plans as outlined previously.    The tracings for the above mentioned monitor can be requested by calling [facility] at [phone], ext. [phone].      Interpreted by : Lamount Cranker, MD

## 2020-11-21 ENCOUNTER — Other Ambulatory Visit (INDEPENDENT_AMBULATORY_CARE_PROVIDER_SITE_OTHER): Payer: Self-pay | Admitting: Family Medicine

## 2020-11-21 DIAGNOSIS — E1142 Type 2 diabetes mellitus with diabetic polyneuropathy: Secondary | ICD-10-CM

## 2020-11-22 NOTE — Telephone Encounter (Signed)
90 tablets, 0 refill pending for glimepiride (AMARYL) 2 MG tablet    Last OV - 08/08/20 and to f/u in 6 months (due 02/06/21)    Dr. Elwyn Lade- Please review. Thank you!

## 2020-11-29 ENCOUNTER — Encounter (INDEPENDENT_AMBULATORY_CARE_PROVIDER_SITE_OTHER): Payer: Self-pay | Admitting: Physician Assistant

## 2020-11-29 ENCOUNTER — Ambulatory Visit (INDEPENDENT_AMBULATORY_CARE_PROVIDER_SITE_OTHER): Payer: No Typology Code available for payment source | Admitting: Physician Assistant

## 2020-11-29 DIAGNOSIS — I480 Paroxysmal atrial fibrillation: Secondary | ICD-10-CM

## 2020-11-29 NOTE — Progress Notes (Signed)
[place] [facility] OFF[facility] PROGRESS NOTE    HRT [facility] OFFICE -CARDIOLOGY  [address]  [place] [address]  Dept: [phone]  Dept Fax: [phone]       Patient Name: Brian Pena, Brian Pena    Date of Visit:  November 29, 2020  Date of Birth: December 08, 1945  AGE: 75 y.o.  Medical Record #: [id number]  Requesting Physician: Staci Acosta, MD      CHIEF COMPLAINT: Atrial Fibrillation      HISTORY OF PRESENT ILLNESS:    He is a pleasant 75 y.o. male who presents today for follow-up on recent testing.  He was noted to be in atrial fibrillation with CVR at last visit, with which she was asymptomatic.  Dr. Octavio Graves resumed anticoagulation with Eliquis 5 mg twice daily, which patient is tolerating well citing no adverse effects.  Follow-up 3-day ambulatory monitoring showed sinus rhythm throughout with no atrial fibrillation.  Echocardiogram on 10/18/2020 showed normal LV size and systolic function (EF 60%), mild LVH, mild LAE (42 by LA VI), and an indeterminate diastolic function.    He returns today feeling well, denying any chest pain/pressure/tightness, dyspnea at rest or exertion, palpitations, peripheral edema, orthopnea, or PND.  His activity is admittedly limited by his back.  He walks with a Rollator.  He is celebrating his 50th wedding anniversary today, and he will turn 8 tomorrow.  His daughters are planning a surprise party.      PAST MEDICAL HISTORY: He has a past medical history of Anemia, Cerebrovascular accident, Diabetes mellitus, Hyperlipidemia, and Hypertension. He has a past surgical history that includes Knee surgery (Left, 10/08/1978); Back surgery (2017); and Cervical spine surgery (2019).    ALLERGIES:   Allergies   Allergen Reactions   . Linezolid Nausea Only, Other (See Comments) and Anaphylaxis     weakness  Decreased appetite, nausea, fatigue.  Decreased appetite, nausea, fatigue.         MEDICATIONS:   Current Outpatient Medications    Medication Sig   . acetaminophen (TYLENOL) 500 MG tablet Take 500 mg by mouth every 6 (six) hours as needed for Pain   . amLODIPine (NORVASC) 5 MG tablet TAKE 1 TABLET(5 MG) BY MOUTH DAILY   . apixaban (ELIQUIS) 5 MG Take 1 tablet (5 mg total) by mouth every 12 (twelve) hours   . CALCIUM PO Take 1,000 mg by mouth daily   . glimepiride (AMARYL) 2 MG tablet TAKE 1 TABLET(2 MG) BY MOUTH EVERY MORNING   . glucose blood test strip Accu-Chek Aviva Plus test strips   . ibuprofen (ADVIL) 200 MG tablet Take 200 mg by mouth as needed for Pain   . Lancets (accu-chek multiclix) lancets Accu-Chek Softclix Lancets   . lisinopril (ZESTRIL) 20 MG tablet TAKE 1 TABLET(20 MG) BY MOUTH DAILY   . metFORMIN (GLUCOPHAGE) 1000 MG tablet TAKE 1 TABLET BY MOUTH TWICE DAILY   . metoprolol tartrate (LOPRESSOR) 50 MG tablet TAKE 1 TABLET(50 MG) BY MOUTH DAILY   . Multiple Vitamin (MULTIVITAMIN ADULT PO) Take by mouth daily   . raNITIdine HCl (RANITIDINE 75 PO) Take 150 mg by mouth daily   . rosuvastatin (CRESTOR) 5 MG tablet TAKE 1 TABLET BY MOUTH ONCE DAILY   . tamsulosin (FLOMAX) 0.4 MG Cap Take 0.4 mg by mouth 2 (two) times daily        FAMILY HISTORY: family history includes Diabetes in his father and mother; Heart disease in his father;  Stroke in his father.    SOCIAL HISTORY: He reports that he quit smoking about 8 years ago. He has a 25.00 pack-year smoking history. He has never used smokeless tobacco. He reports previous alcohol use. He reports that he does not use drugs.    PHYSICAL EXAMINATION    Visit Vitals  BP 144/76 (BP Site: Left arm, Patient Position: Sitting, Cuff Size: Medium)   Pulse 72   Ht 1.778 m (5\' 10" )   Wt 73 kg (161 lb)   BMI 23.10 kg/m       General Appearance:  A well-appearing male in no acute distress.    Skin: Warm and dry to touch, no apparent skin lesions, or masses noted.  Head: Normocephalic, normal hair pattern, no masses or tenderness   Eyes: EOMS Intact, PERRL, conjunctivae and lids unremarkable.  ENT:  Ears, Nose and throat reveal no gross abnormalities.  No pallor or cyanosis.  Dentition good.   Neck: JVP normal, no carotid bruit, thyroid not enlarged   Chest: Clear to auscultation bilaterally with good air movement and respiratory effort and no wheezes, rales, or rhonchi   Cardiovascular: Regular rhythm, S1 normal, S2 normal, No S3 or S4. Apical impulse not displaced. No murmur. No gallops or rubs detected   Abdomen: Soft, nontender, nondistended, with normoactive bowel sounds. No organomegaly.  No pulsatile masses, or bruits.   Extremities: Warm without edema. No clubbing, or cyanosis. All peripheral pulses are full and equal.  Ambulates with Rollator.  Neuro: Alert and oriented x3. No gross motor or sensory deficits noted, affect appropriate.            LABS:   Lab Results   Component Value Date    WBC 5.6 11/17/2019    HGB 12.2 (L) 11/17/2019    HCT 36.9 (L) 11/17/2019    PLT 171 11/17/2019     Lab Results   Component Value Date    GLU 104 (H) 08/14/2020    BUN 19 08/14/2020    CREAT 0.93 08/14/2020    NA 143 08/14/2020    K 4.3 08/14/2020    CL 107 (H) 08/14/2020    CO2 24 08/14/2020    AST 18 08/14/2020    ALT 12 08/14/2020     Lab Results   Component Value Date    TSH 1.010 11/17/2019    HGBA1C 6.2 (H) 08/14/2020     Lab Results   Component Value Date    CHOL 133 08/14/2020    TRIG 148 08/14/2020    HDL 38 (L) 08/14/2020    LDL 69 08/14/2020           IMPRESSION:   Mr. Brian Pena is a 75 y.o. male with the following problems:    1. 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            CHADS-VASc score at least 5 (age, HTN, prior stroke). On Eliquis 5mg  BID and tolerating well.  2. History of marked sinus bradycardia, in part iatrogenic from beta-blocker use, but ASx.   3. HTN -mildly elevated today, but otherwise well controlled on therapy.  4. HLD - controlled by labs 08/2020, as above.  5. Pre-DM HgBA1C 6.2% 08/2020.  6. Other medical issues:        A.          Orthopedic issues: Recurrent back issues with prior surgeries; Bilateral knee osteoarthritis for which he has received shots in the  past.        B.         [place] tobacco use having stopped many years ago.        C.         Family medical history of heart disease        D.         History of prior stroke per patient ~ 15+ yrs ago w/LUE weakness.        E.         Peripheral neuropathy        F.         Limited ability to walk: Uses a Rollator      RECOMMENDATIONS:  1.         Continue current regimen as prescribed.  We confirm that he is indeed taking metoprolol tartrate 50 mg in the morning only, we will continue this given his good BP and HR control and lack of symptoms.    2. Diet, encourage exercise as able and as directed.    3. Follow-up and copy of the above note to PCP and specialists.  4. Barring any change in cardiac symptoms, follow-up with [place] annually as planned- or sooner if needed.                                                   No orders of the defined types were placed in this encounter.      No orders of the defined types were placed in this encounter.      SIGNED:    Dario Ave, PA          This note was generated by the Dragon speech recognition and may contain errors or omissions not intended by the user. Grammatical errors, random word insertions, deletions, pronoun errors, and incomplete sentences are occasional consequences of this technology due to software limitations. Not all errors are caught or corrected. If there are questions or concerns about the content of this note or information contained within the body of this dictation, they should be addressed directly with the author for clarification.

## 2020-12-10 NOTE — H&P (Signed)
[facility]  [address] [place], [place] [address]     [name]    Date of Visit:  08/16/2017  Date of Birth: 1946-06-07  Age: 75 yrs.   Medical Record Number: [id number]  Referring Physician: Elwyn Lade MD, [facility].  __   CURRENT DIAGNOSES     1. Hypertension (essential or benign or malignant), I10  2. Atrial Fibrillation, Paroxysmal, I48.0  3. CVA, I67.89  4. Pre-op cardiovascular examination, Z01.810   __  ALLERGIES    __   MEDICATIONS     1. aspirin 81 mg tablet,delayed release, 1 po qd  2. B Complex-Vitamin B12 tablet, 1 po qd  3. glimepiride 2 mg tablet, 1 po qd  4. ibuprofen 200 mg tablet, PRN  5.  metformin ER 1,000 mg tablet,extended release 24hr, 1 po bid  6. oxycodone-acetaminophen 5 mg-325 mg tablet, Take as Directed  7. ranitidine 150 mg capsule, 1 po qd  8. rosuvastatin 5 mg tablet, 1 po qd  9. 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 Rolinda Roan for preoperative cardiac  evaluation. Brian Pena is scheduled for elective repeat back surgery. From a cardiac standpoint he has a long history of paroxysmal atrial fibrillation dating back to 2006, occurring usually every two to three months. He has bradycardia related in  part to beta blocker usage. He has no previous history of MI or congestive heart failure. He did have a stroke in 2012 which manifested itself as left hand weakness lasting approximately eight hours but abnormality was present on head CT. He has had no  recurrence of symptoms. Although limited in his ambulation because of his current back problem, he denies any exertional shortness of breath, PND, orthopnea, peripheral edema, or claudication. Apart from paroxysmal atrial fibrillation he has had no  dysrhythmias.  He had previously been on anticoagulation for his paroxysmal atrial fibrillation but quit those due to what he says are cost considerations.   __  PAST HISTORY      Past Medical Illnesses: "cerebral infarct" July 2012, DM type II 2010, Hypertension 2010;  Past Cardiac  Illnesses: paroxysmal atrial fib 2006; Infectious Diseases: MRSA January 2017;  Surgical Procedures: Left knee 1962 and 1980, Lumbar surgery January 2017; Trauma History: Fractured left  hand 1976; Cardiology Procedures-Noninvasive: stress test 2006 & 2013. echocardiogram 2012   ___  FAMILY HISTORY  Father -- Diabetes mellitus, Hypertension  Mother --  Diabetes mellitus, CVA    __  CARDIAC RISK FACTORS     Tobacco Abuse: used to smoke, but quit;  Family History of Heart Disease: positive; Hyperlipidemia: positive;  Hypertension: positive;  Diabetes Mellitus: positive;  Prior History of Heart Disease: negative; Obesity: negative;  Sedentary Life Style:negative; NFA:[id number]  __   SOCIAL HISTORY    Alcohol Use: Does not use alcohol;  Smoking: Does not smoke; Former smoker [phone]); Diet: Caffeine use-1-2 per day;  Exercise: Exercises regularly;   __  REVIEW OF SYSTEMS     General: decreased exercise tolerance; Integumentary: Denies any change in hair or nails, rashes, or  skin lesions.; Eyes: Denies diplopia, glaucoma or visual field defects.; Ears, Nose, Throat, Mouth : Denies any hearing loss, epistaxis, hoarseness or difficulty speaking.;Respiratory: Denies dyspnea, cough, wheezing or hemoptysis.;  Cardiovascular: Denies palpitations,  chest pain, orthopnea, PND, peripheral edema, syncope or claudication.; Abdominal  : Denies ulcer disease, hematochezia or melena.;Musculoskeletal:arthritis of the back; Neurological  : previous cerebral vascular accident; Psychiatric: Denies any depression, substance abuse or change in cognitive functions.;  Endocrine: Denies any weight change, heat/cold intolerance, polydipsia, or polyuria;  Hematologic/Immunologic : Denies any food allergies, seasonal allergies, bleeding disorders.  __  PHYSICAL EXAMINATION    Vital Signs:   Blood Pressure:  146/78 Sitting, Right arm, regular cuff  148/80 Sitting, Left arm, regular cuff    Weight:  173.00 lbs.  Height: 70"  BMI: 25    Pulse: 60/min.       Constitutional: Cooperative, alert and oriented,well developed, well  nourished, in no acute distress. Skin: surgical scars well-healed Head : normocephalic Eyes: conjunctivae and lids normal ENT : No pallor or cyanosis Neck: JVP normal, no carotid bruit, thyroid not enlarged Chest : clear to auscultation bilaterally Cardiac: S1 normal, S2 normal. Apical impulse not displaced, no murmurs, gallops or rubs detected.  Abdomen: abdomen normal Peripheral Pulses: pulses below the femoral arteries are diminished  Extremities/Back: no edema present Neurological: oriented to time, person and place   __     Medications added today by the physician:    ECG:   ECG today shows marked sinus bradycardia, is otherwise within normal limits.     IMPRESSION AND RECOMMENDATION:   Brian Pena  is a 75 year old gentleman who is considered low cardiac risk for surgery. I have instructed him to hold his metoprolol the day of surgery because of his marked resting sinus bradycardia. I did discuss with him that once he has recovered from his back  surgery he should reconsider longterm anticoagulation. If he cannot afford one of the new oral anticoagulants, then he should go on therapeutic warfarin.     Thank you very much for referring him to [place].     Jaceyon Strole A. Hyacinth Meeker, MD, [facility],  FSCAI    LAM/tutlc     cc: Rolinda Roan MD FAX [phone]  [facility]   Elspeth Cho MD    ENC: ECG     el

## 2021-02-19 ENCOUNTER — Other Ambulatory Visit (INDEPENDENT_AMBULATORY_CARE_PROVIDER_SITE_OTHER): Payer: Self-pay | Admitting: Family Medicine

## 2021-02-19 DIAGNOSIS — I10 Essential (primary) hypertension: Secondary | ICD-10-CM

## 2021-02-19 NOTE — Telephone Encounter (Signed)
Pt's wife is calling to check on status of request. Pt only has 5 pills left and has an upcoming appointment on 7/11 with dr. Elwyn Lade. Please advise.

## 2021-02-19 NOTE — Telephone Encounter (Signed)
Dr Elwyn Lade - please review - see message below.    30 tablets / 0 refills   lisinopril (ZESTRIL) 20 MG tablet    Last OV: 08/08/2020 (dx: acute cystitis without hematuria + 9 more)    Patient has an appointment scheduled for 03/17/2021

## 2021-02-20 NOTE — Telephone Encounter (Signed)
Medication refilled

## 2021-03-06 ENCOUNTER — Encounter (INDEPENDENT_AMBULATORY_CARE_PROVIDER_SITE_OTHER): Payer: Self-pay | Admitting: Family Medicine

## 2021-03-17 ENCOUNTER — Ambulatory Visit (INDEPENDENT_AMBULATORY_CARE_PROVIDER_SITE_OTHER): Payer: No Typology Code available for payment source | Admitting: Family Medicine

## 2021-04-07 ENCOUNTER — Encounter (INDEPENDENT_AMBULATORY_CARE_PROVIDER_SITE_OTHER): Payer: Self-pay | Admitting: Family Medicine

## 2021-04-07 ENCOUNTER — Ambulatory Visit (INDEPENDENT_AMBULATORY_CARE_PROVIDER_SITE_OTHER): Payer: No Typology Code available for payment source | Admitting: Family Medicine

## 2021-04-07 ENCOUNTER — Telehealth (INDEPENDENT_AMBULATORY_CARE_PROVIDER_SITE_OTHER): Payer: Self-pay | Admitting: Family Medicine

## 2021-04-07 VITALS — BP 132/60 | HR 53 | Temp 98.6°F | Ht 70.0 in | Wt 161.0 lb

## 2021-04-07 DIAGNOSIS — E782 Mixed hyperlipidemia: Secondary | ICD-10-CM

## 2021-04-07 DIAGNOSIS — M48062 Spinal stenosis, lumbar region with neurogenic claudication: Secondary | ICD-10-CM

## 2021-04-07 DIAGNOSIS — R310 Gross hematuria: Secondary | ICD-10-CM

## 2021-04-07 DIAGNOSIS — G629 Polyneuropathy, unspecified: Secondary | ICD-10-CM

## 2021-04-07 DIAGNOSIS — I10 Essential (primary) hypertension: Secondary | ICD-10-CM

## 2021-04-07 DIAGNOSIS — E1142 Type 2 diabetes mellitus with diabetic polyneuropathy: Secondary | ICD-10-CM

## 2021-04-07 DIAGNOSIS — L720 Epidermal cyst: Secondary | ICD-10-CM | POA: Insufficient documentation

## 2021-04-07 DIAGNOSIS — I480 Paroxysmal atrial fibrillation: Secondary | ICD-10-CM

## 2021-04-07 DIAGNOSIS — N3941 Urge incontinence: Secondary | ICD-10-CM | POA: Insufficient documentation

## 2021-04-07 NOTE — Progress Notes (Signed)
[facility] [place] [facility]                       Date of Exam: 04/07/2021 1:25 PM        Patient ID: Brian Pena is a 75 y.o. male.  Attending Physician: Staci Acosta, MD        Chief Complaint:    Chief Complaint   Patient presents with    Hyperlipidemia     Follow up    Hypertension     Follow up    Diabetes Follow-up               HPI:    HPI  Chief Complaint: medication follow up    Any other concerns?: none    Here today for chronic care follow-up. He reports that he is gradually getting weaker and has had a number of falls. He fell out of bed this morning and the other day he fell in the shower. He was trying to dry his back and said that unless he gets his balance just right when he is doing that he will fall. He has shower chair. He reports difficulty getting up out of the chair or out of the bed. A lot of his falls occur when he is doing that. He says that six times out of seven he can stand up without any trouble on the seventh try his legs are so weak he cannot stand up. He has required assistance to get out of the chair several times recently. The last time he saw the neurologist he recommended an MRI, patient has extreme claustrophobia and does not like MRIs and does not want to have surgery so he declined.    No chest pain, but he does have palpitations. He saw Dr. Octavio Graves and had a monitor. He has paroxysmal atrial fibrillation. Dr. Octavio Graves started him on Eliquis 5 mg twice a day. He has stopped the medication. A couple of weeks ago he had gross hematuria and his arms were really bruised. Since stopping the Eliquis the hematuria has cleared and the bruising is slowly getting better. He used to be on baby aspirin and he would bruise a little bit on his hands, but nothing like he was doing with Eliquis.   No nausea, vomiting, diarrhea, constipation or heartburn. He says his appetite is diminished and he has probably lost a little weight. Looking at all records his weight is  stable over the last eight months.   He notes that his urine has been a little stronger recently. He has urinary frequency and sometimes urgency. He says once the urge hits if he cannot get to the bathroom within 3 to 4 minutes he will lose control of his bladder. He goes about every hour and 1/2 during the day.  He has neuropathy in his feet and feels that he may be getting close the time when he should stop driving. He says he can still feel the pedals but it takes a lot of concentration so he really only drives short distances. Feels that he is still safe to drive but recognizes that the time is coming when that will not be the case.  He reports that the cyst on his chest is coming back. He had a large cyst that I drained last summer.            Problem List:    Patient Active Problem List   Diagnosis  Transient global amnesia    Cardiomyopathy    Type 2 diabetes mellitus    Lumbar stenosis with neurogenic claudication    Neuropathy    Paroxysmal atrial fibrillation    Anemia    Basal cell carcinoma of skin    Benign prostatic hyperplasia with urinary obstruction    Essential hypertension    History of cerebrovascular accident    Hyperlipidemia    Low back pain    Numbness of hand    Peripheral vascular disease    Well adult health check    Infected sebaceous cyst    Urinary urgency    Epidermoid cyst    Urge incontinence    Gross hematuria             Current Meds:    Outpatient Medications Marked as Taking for the 04/07/21 encounter (Office Visit) with Staci Acosta, MD   Medication Sig Dispense Refill    acetaminophen (TYLENOL) 500 MG tablet Take 500 mg by mouth every 6 (six) hours as needed for Pain      amLODIPine (NORVASC) 5 MG tablet TAKE 1 TABLET(5 MG) BY MOUTH DAILY 90 tablet 1    CALCIUM PO Take 1,000 mg by mouth daily      glimepiride (AMARYL) 2 MG tablet TAKE 1 TABLET(2 MG) BY MOUTH EVERY MORNING 90 tablet 1    glucose blood test strip Accu-Chek Aviva Plus test strips      ibuprofen (ADVIL) 200  MG tablet Take 200 mg by mouth as needed for Pain      Lancets (accu-chek multiclix) lancets Accu-Chek Softclix Lancets      lisinopril (ZESTRIL) 20 MG tablet TAKE 1 TABLET BY MOUTH EVERY DAY 91 tablet 0    metFORMIN (GLUCOPHAGE) 1000 MG tablet TAKE 1 TABLET BY MOUTH TWICE DAILY 180 tablet 1    metoprolol tartrate (LOPRESSOR) 50 MG tablet TAKE 1 TABLET(50 MG) BY MOUTH DAILY 90 tablet 1    Multiple Vitamin (MULTIVITAMIN ADULT PO) Take by mouth daily      raNITIdine HCl (RANITIDINE 75 PO) Take 150 mg by mouth daily      rosuvastatin (CRESTOR) 5 MG tablet TAKE 1 TABLET BY MOUTH ONCE DAILY 90 tablet 1    tamsulosin (FLOMAX) 0.4 MG Cap Take 0.4 mg by mouth 2 (two) times daily            Allergies:    Allergies   Allergen Reactions    Linezolid Nausea Only, Other (See Comments) and Anaphylaxis     weakness  Decreased appetite, nausea, fatigue.  Decreased appetite, nausea, fatigue.               Past Surgical History:    Past Surgical History:   Procedure Laterality Date    BACK SURGERY  2017    scoliosis and stenosis - 3 surgeries total    CERVICAL SPINE SURGERY  2019    KNEE SURGERY Left 10/08/1978    medial meniscectomy           Family History:    Family History   Problem Relation Age of Onset    Diabetes Mother     Heart disease Father     Stroke Father     Diabetes Father            Social History:    Social History     Tobacco Use    Smoking status: Former     Packs/day: 0.50     Years: 50.00  Pack years: 25.00     Types: Cigarettes     Quit date: 2014     Years since quitting: 8.6    Smokeless tobacco: Never   Vaping Use    Vaping Use: Never used   Substance Use Topics    Alcohol use: Not Currently     Alcohol/week: 0.0 standard drinks    Drug use: Never           The following sections were reviewed this encounter by the provider:            Vital Signs:    BP 132/60 (BP Site: Right arm, Patient Position: Sitting, Cuff Size: Medium)    Pulse (!) 53    Temp 98.6 F (37 C)    Ht 1.778 m (5\' 10" )    Wt 73 kg (161  lb)    SpO2 98%    BMI 23.10 kg/m          ROS:    Review of Systems   See HPI        Physical Exam:    Physical Exam  Vitals and nursing note reviewed.   Constitutional:       Appearance: Normal appearance. He is not ill-appearing.   Cardiovascular:      Rate and Rhythm: Normal rate and regular rhythm.      Heart sounds: No murmur heard.  Pulmonary:      Effort: Pulmonary effort is normal. No respiratory distress.      Breath sounds: Normal breath sounds. No wheezing or rales.   Skin:     Comments: There is a 1 cm firm sebaceous cyst in the left chest.    Neurological:      Mental Status: He is alert.      Gait: Gait abnormal.      Comments: He walks with a Rollator and and there is scissoring of his gait when he walks.   Psychiatric:         Mood and Affect: Mood normal.         Behavior: Behavior normal.            Assessment:    1. Essential hypertension  - CBC and differential; Future  - Comprehensive metabolic panel; Future  - Urine Microalbumin Random; Future  - TSH; Future    2. Mixed hyperlipidemia  - CBC and differential; Future  - Comprehensive metabolic panel; Future  - Lipid panel; Future    3. Type 2 diabetes mellitus with diabetic polyneuropathy, without long-term current use of insulin  - CBC and differential; Future  - Comprehensive metabolic panel; Future  - Urine Microalbumin Random; Future  - Hemoglobin A1C; Future    4. Urge incontinence    5. Gross hematuria  - Urinalysis; Future  - Urine culture; Future    6. Lumbar stenosis with neurogenic claudication    7. Neuropathy            Plan:    Blood pressure is well controlled today.   It time to update his diabetes blood work.   Will check urinalysis and culture because of the recent hematuria.   Please stay off the Eliquis. I will talk with Dr. Octavio Graves regarding the issue. Eliquis is a concern because of the hematuria, significant bruising and his falls. Nevertheless he has had a stroke so going without anticoagulation is concerning.   He does not  need refills at the moment. He can have the pharmacy call us when  he does.   We discussed physical therapy but he says every time he has done it recently the therapist do it for a while and he does not get better they recommend surgery and he does not want to have surgery.   We discussed adaptive equipment in the house. He has grab bars in the shower and a toilet seat that he uses in the shower as a shower chair. He only goes upstairs twice a month to pay the bills. He has a scooter for when he goes on vacation but it is awkward to use in the house and is really too heavy to be convenient for use by himself.   Follow-up in six months, call sooner for problems.          Follow-up:    No follow-ups on file.         Staci Acosta, MD

## 2021-04-07 NOTE — Telephone Encounter (Signed)
Call Dr. Octavio Graves about Eliquis

## 2021-04-08 ENCOUNTER — Encounter (INDEPENDENT_AMBULATORY_CARE_PROVIDER_SITE_OTHER): Payer: Self-pay | Admitting: Physician Assistant

## 2021-04-08 ENCOUNTER — Other Ambulatory Visit (INDEPENDENT_AMBULATORY_CARE_PROVIDER_SITE_OTHER): Payer: Self-pay | Admitting: Physician Assistant

## 2021-04-08 DIAGNOSIS — I480 Paroxysmal atrial fibrillation: Secondary | ICD-10-CM

## 2021-04-08 NOTE — Progress Notes (Signed)
Patient with PAFib and history of stroke, having gross hematuria and significant bruising/hematomas while on Eliquis therapy.  Recommend he consider Watchman.  Order placed. Spoke with pt's PCP today. He's currently off OAC, awaiting results of UA

## 2021-04-08 NOTE — Telephone Encounter (Signed)
Left message with Dr. Candice Camp office to call back about the Eliquis.

## 2021-04-08 NOTE — Telephone Encounter (Signed)
Discussed with Dr. Candice Camp PA earlier this afternoon. Lower dose of Eliquis is an option. Determination of the etiology for his gross hematuria St is needed before restarting anticoagulants. Consider watchmen device to occlude the left atrial appendage which would lower his risk of clots and he would not have to take anticoagulants. If he is interested, I will have Brian Pena contact them for further discussion.

## 2021-04-12 LAB — URINE CULTURE

## 2021-04-12 LAB — CBC AND DIFFERENTIAL
Baso(Absolute): 0 10*3/uL (ref 0.0–0.2)
Basophils Automated: 0 %
Eosinophils Absolute: 0.1 10*3/uL (ref 0.0–0.4)
Eosinophils Automated: 1 %
Hematocrit: 36.8 % — ABNORMAL LOW (ref 37.5–51.0)
Hemoglobin: 11.6 g/dL — ABNORMAL LOW (ref 13.0–17.7)
Immature Granulocytes Absolute: 0 10*3/uL (ref 0.0–0.1)
Immature Granulocytes: 0 %
Lymphocytes Absolute: 2 10*3/uL (ref 0.7–3.1)
Lymphocytes Automated: 28 %
MCH: 29.1 pg (ref 26.6–33.0)
MCHC: 31.5 g/dL (ref 31.5–35.7)
MCV: 93 fL (ref 79–97)
Monocytes Absolute: 0.6 10*3/uL (ref 0.1–0.9)
Monocytes: 9 %
Neutrophils Absolute Count: 4.5 10*3/uL (ref 1.4–7.0)
Neutrophils: 62 %
Platelets: 285 10*3/uL (ref 150–450)
RBC: 3.98 x10E6/uL — ABNORMAL LOW (ref 4.14–5.80)
RDW: 12.5 % (ref 11.6–15.4)
WBC: 7.2 10*3/uL (ref 3.4–10.8)

## 2021-04-12 LAB — COMPREHENSIVE METABOLIC PANEL
ALT: 12 IU/L (ref 0–44)
AST (SGOT): 19 IU/L (ref 0–40)
Albumin/Globulin Ratio: 1.9 (ref 1.2–2.2)
Albumin: 4.1 g/dL (ref 3.7–4.7)
Alkaline Phosphatase: 73 IU/L (ref 44–121)
BUN / Creatinine Ratio: 29 — ABNORMAL HIGH (ref 10–24)
BUN: 26 mg/dL (ref 8–27)
Bilirubin, Total: 0.4 mg/dL (ref 0.0–1.2)
CO2: 21 mmol/L (ref 20–29)
Calcium: 9.7 mg/dL (ref 8.6–10.2)
Chloride: 105 mmol/L (ref 96–106)
Creatinine: 0.89 mg/dL (ref 0.76–1.27)
Globulin, Total: 2.2 g/dL (ref 1.5–4.5)
Glucose: 92 mg/dL (ref 65–99)
Potassium: 4.1 mmol/L (ref 3.5–5.2)
Protein, Total: 6.3 g/dL (ref 6.0–8.5)
Sodium: 142 mmol/L (ref 134–144)
eGFR: 88 mL/min/{1.73_m2} (ref >59)

## 2021-04-12 LAB — URINALYSIS
Bilirubin, UA: NEGATIVE
Glucose, UA: NEGATIVE
Ketones UA: NEGATIVE
Nitrite, UA: POSITIVE — AB
Specific Gravity UA: 1.017 (ref 1.005–1.030)
Urine pH: 5.5 (ref 5.0–7.5)
Urobilinogen, UA: 0.2 mg/dL (ref 0.2–1.0)

## 2021-04-12 LAB — REFLEX - MICROSCOPIC EXAMINATION
Casts, UA: NONE SEEN /lpf
Epithelial Cells (non renal): NONE SEEN /hpf (ref 0–10)
WBC, UA: >30 /hpf — AB (ref 0–5)

## 2021-04-12 LAB — LIPID PANEL
Cholesterol / HDL Ratio: 4 ratio (ref 0.0–5.0)
Cholesterol: 127 mg/dL (ref 100–199)
HDL: 32 mg/dL — ABNORMAL LOW (ref >39)
LDL Chol Calculated (NIH): 69 mg/dL (ref 0–99)
Triglycerides: 151 mg/dL — ABNORMAL HIGH (ref 0–149)
VLDL Calculated: 26 mg/dL (ref 5–40)

## 2021-04-12 LAB — MICROALBUMIN, RANDOM URINE
Creatinine, UR: 66.7 mg/dL
Microalb/Crt. Ratio: 118 mg/g creat — ABNORMAL HIGH (ref 0–29)
Microalbumin, UR: 78.4 ug/mL

## 2021-04-12 LAB — HEMOGLOBIN A1C: Hemoglobin A1C: 6.3 % — ABNORMAL HIGH (ref 4.8–5.6)

## 2021-04-12 LAB — TSH: TSH: 1.2 u[IU]/mL (ref 0.450–4.500)

## 2021-04-14 ENCOUNTER — Telehealth (INDEPENDENT_AMBULATORY_CARE_PROVIDER_SITE_OTHER): Payer: Self-pay | Admitting: Family Medicine

## 2021-04-14 NOTE — Telephone Encounter (Signed)
Please send in medication if appropriate.  Thank you

## 2021-04-14 NOTE — Telephone Encounter (Signed)
Pt's wife says pt had an appt 2 weeks ago with Dr. Elwyn Lade. Bw and labs were ordered. She says pt urinalysis showed that pt has a UTI. She wants to know if Dr. Elwyn Lade will prescribed meds for his UTI??    [facility] # [id number]  [phone]  [address], [place]

## 2021-04-15 ENCOUNTER — Other Ambulatory Visit (INDEPENDENT_AMBULATORY_CARE_PROVIDER_SITE_OTHER): Payer: Self-pay | Admitting: Family Medicine

## 2021-04-15 DIAGNOSIS — E1142 Type 2 diabetes mellitus with diabetic polyneuropathy: Secondary | ICD-10-CM

## 2021-04-15 DIAGNOSIS — E782 Mixed hyperlipidemia: Secondary | ICD-10-CM

## 2021-04-15 DIAGNOSIS — I10 Essential (primary) hypertension: Secondary | ICD-10-CM

## 2021-04-15 DIAGNOSIS — N3001 Acute cystitis with hematuria: Secondary | ICD-10-CM | POA: Insufficient documentation

## 2021-04-15 MED ORDER — APIXABAN 2.5 MG PO TABS
2.5000 mg | ORAL_TABLET | Freq: Two times a day (BID) | ORAL | 1 refills | Status: DC
Start: 2021-04-15 — End: 2021-07-06

## 2021-04-15 MED ORDER — AMOXICILLIN 875 MG PO TABS
875.0000 mg | ORAL_TABLET | Freq: Two times a day (BID) | ORAL | 0 refills | Status: AC
Start: 2021-04-15 — End: 2021-04-25

## 2021-04-15 NOTE — Telephone Encounter (Signed)
I have sent the message below to patient via MyChart. I would like [place] to contact him to make sure he picked up the message and knows that I sent in the prescription. I also have a question about whether he has been evaluated for anemia in the past.    Dear Mr. Brian Pena,  Your blood count shows mild anemia, a little bit worse than it was a year ago. You have had an anemia for at least five years. Has this previously been evaluated to determine a cause?    Your blood chemistries are normal except for the slightly elevated BUN/creatinine ratio which is not clinically significant finding. Your cholesterol is well controlled. Your urine microalbumin creatinine ratio shows protein in the urine. This may be related to a urinary tract infections and your urine culture is positive. Your hemoglobin A1c shows excellent diabetic control. Your thyroid is normal.     Your urinalysis is consistent with the urinary infection and your culture is positive for E. Coli sensitive to multiple Antibiotics. I would recommend amoxicillin 875 mg twice a day for 10 days. Please take the medication with breakfast and dinner and take a probiotic such as Culturelle or Florastor at bedtime. I will send the prescription to your pharmacy.  Dr. Elwyn Lade

## 2021-04-15 NOTE — Addendum Note (Signed)
Addended by: Rolinda Roan I on: 04/15/2021 04:24 PM     Modules accepted: Orders

## 2021-04-15 NOTE — Progress Notes (Signed)
Dear Mr. Brian Pena,  Your blood count shows mild anemia, a little bit worse than it was a year ago. You have had an anemia for at least five years. Has this previously been evaluated to determine a cause?    Your blood chemistries are normal except for the slightly elevated BUN/creatinine ratio which is not clinically significant finding. Your cholesterol is well controlled. Your urine microalbumin creatinine ratio shows protein in the urine. This may be related to a urinary tract infections and your urine culture is positive. Your hemoglobin A1c shows excellent diabetic control. Your thyroid is normal.     Your urinalysis is consistent with the urinary infection and your culture is positive for E. Coli sensitive to multiple Antibiotics. I would recommend amoxicillin 875 mg twice a day for 10 days. Please take the medication with breakfast and dinner and take a probiotic such as Culturelle or Florastor at bedtime. I will send the prescription to your pharmacy.  Dr. Elwyn Lade    Please call patient and make sure he picked up the results from the portal and knows that I want him to take an antibiotic which I will send in his pharmacy. Also, has he ever had any evaluation of his anemia?

## 2021-04-15 NOTE — Telephone Encounter (Signed)
The patient, he received his information regarding treatment for his UTI. Question was his hematuria related solely to his UTI or solely to Eliquis or combination of the above? The bleeding stopped when he stopped the Eliquis so I am not sure the UTIs the only cause. I have spoken with cardiology PA on we discussed the watchmen device or lower dose Eliquis. Your is a concern not only with bleeding on the Eliquis but also his history of falls increases his risk of bleeding in his head if he has a head injury. He is going to see the cardiologist at the end of the month. I would recommend the lower dose Eliquis, 2.5 mg twice a day starting when he finishes the antibiotic. He can then discuss other options with cardiology when he sees them. I will send the lower dose prescription to the pharmacy.

## 2021-04-16 NOTE — Telephone Encounter (Signed)
Pt called back and states she did receive the message and has already picked up the RX.

## 2021-04-16 NOTE — Telephone Encounter (Signed)
Left detailed msg for pt stating that there was a prescription sent in, and a portal message.  Stated for pts wife to either call the office or respond to the message on MyChart

## 2021-05-05 ENCOUNTER — Encounter (INDEPENDENT_AMBULATORY_CARE_PROVIDER_SITE_OTHER): Payer: Self-pay

## 2021-05-05 ENCOUNTER — Institutional Professional Consult (permissible substitution) (INDEPENDENT_AMBULATORY_CARE_PROVIDER_SITE_OTHER): Payer: No Typology Code available for payment source | Admitting: Clinical Cardiac Electrophysiology

## 2021-05-05 NOTE — Progress Notes (Deleted)
[facility] ELECTROPHYSIOLOGY OFFICE CONSULTATION NOTE    HRT [facility] OFFICE      [facility] [facility] OFFICE [facility]  [address]  [place] [address]  Dept: [phone]  Dept Fax: [phone]         Patient Name: Brian Pena, Brian Pena    Date of Visit:  May 05, 2021  Date of Birth: Nov 15, 1945  AGE: 75 y.o.  Medical Record #: [id number]    Primary Electrophysiologist: Peyton Najjar MD, [facility]     Referring Cardiologist: Nechama Guard MD, [facility]      CHIEF COMPLAINT:  No chief complaint on file.      HISTORY OF PRESENT ILLNESS    Brian Pena is being seen today for Electrophysiology evaluation at the request of Nechama Guard MD, [facility] He is a pleasant 75 y.o. male who was scheduled for discussion about watchman implant.  He did not present for appointment.    PAST MEDICAL HISTORY: He has a past medical history of Anemia, Cerebrovascular accident, Diabetes mellitus, Hyperlipidemia, and Hypertension. He has a past surgical history that includes Knee surgery (Left, 10/08/1978); Back surgery (2017); and Cervical spine surgery (2019).    ALLERGIES:   Allergies   Allergen Reactions    Linezolid Nausea Only, Other (See Comments) and Anaphylaxis     weakness  Decreased appetite, nausea, fatigue.  Decreased appetite, nausea, fatigue.         MEDICATIONS:   Current Outpatient Medications:     acetaminophen (TYLENOL) 500 MG tablet, Take 500 mg by mouth every 6 (six) hours as needed for Pain, Disp: , Rfl:     amLODIPine (NORVASC) 5 MG tablet, TAKE 1 TABLET(5 MG) BY MOUTH DAILY, Disp: 90 tablet, Rfl: 1    apixaban (ELIQUIS) 2.5 MG, Take 1 tablet (2.5 mg total) by mouth every 12 (twelve) hours, Disp: 60 tablet, Rfl: 1    CALCIUM PO, Take 1,000 mg by mouth daily, Disp: , Rfl:     glimepiride (AMARYL) 2 MG tablet, TAKE 1 TABLET(2 MG) BY MOUTH EVERY MORNING, Disp: 90 tablet, Rfl: 1    glucose blood test strip, Accu-Chek Aviva Plus test strips, Disp: , Rfl:     ibuprofen (ADVIL) 200 MG tablet, Take  200 mg by mouth as needed for Pain, Disp: , Rfl:     Lancets (accu-chek multiclix) lancets, Accu-Chek Softclix Lancets, Disp: , Rfl:     lisinopril (ZESTRIL) 20 MG tablet, TAKE 1 TABLET BY MOUTH EVERY DAY, Disp: 91 tablet, Rfl: 0    metFORMIN (GLUCOPHAGE) 1000 MG tablet, TAKE 1 TABLET BY MOUTH TWICE DAILY, Disp: 180 tablet, Rfl: 1    metoprolol tartrate (LOPRESSOR) 50 MG tablet, TAKE 1 TABLET(50 MG) BY MOUTH DAILY, Disp: 90 tablet, Rfl: 1    Multiple Vitamin (MULTIVITAMIN ADULT PO), Take by mouth daily, Disp: , Rfl:     raNITIdine HCl (RANITIDINE 75 PO), Take 150 mg by mouth daily, Disp: , Rfl:     rosuvastatin (CRESTOR) 5 MG tablet, TAKE 1 TABLET BY MOUTH EVERY DAY, Disp: 90 tablet, Rfl: 1    tamsulosin (FLOMAX) 0.4 MG Cap, Take 0.4 mg by mouth 2 (two) times daily, Disp: , Rfl:    Patient's current medications were reviewed. ONLY Cardiac medications were updated unless others were addressed in assessment and plan.    FAMILY HISTORY: family history includes Diabetes in his father and mother; Heart disease in his father; Stroke in his father.    SOCIAL HISTORY: He reports that he quit smoking  about 8 years ago. He has a 25.00 pack-year smoking history. He has never used smokeless tobacco. He reports that he does not currently use alcohol. He reports that he does not use drugs.      LABS:   Lab Results   Component Value Date    WBC 7.2 04/09/2021    HGB 11.6 (L) 04/09/2021    HCT 36.8 (L) 04/09/2021    PLT 285 04/09/2021     Lab Results   Component Value Date    GLU 92 04/09/2021    BUN 26 04/09/2021    CREAT 0.89 04/09/2021    NA 142 04/09/2021    K 4.1 04/09/2021    CL 105 04/09/2021    CO2 21 04/09/2021    CA 9.7 04/09/2021    PROT 6.3 04/09/2021    AST 19 04/09/2021    ALT 12 04/09/2021    BILITOTAL 0.4 04/09/2021    GLOB 2.7 12/23/2015    ALB 4.1 04/09/2021     Lab Results   Component Value Date    TSH 1.200 04/09/2021    HGBA1C 6.3 (H) 04/09/2021     No results found for: CREATCL    IMPRESSION:   Brian Pena  is a 75 y.o. male with the following problems:    Atrial fibrillation, paroxysmal  CHADS-VASc score at least 6  Echocardiogram on 10/18/2020 showed normal LV size and systolic function (EF 60%), mild LVH, mild LAE (42 by LA VI), and an indeterminate diastolic function.  History of bradycardia  Hypertension  Hyperlipidemia  Pre-DM HgBA1C 6.2  Other medical issues:        A.         Orthopedic issues: Recurrent back issues with prior surgeries; Bilateral knee osteoarthritis for which she has received shots in the past.        B.         Prior tobacco use        C.         Family medical history of heart disease        D.         History of prior stroke per patient ~ 15+ yrs ago w/LUE weakness.        E.         Peripheral neuropathy        F.         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        Remote tobacco use having stopped many years ago.    RECOMMENDATIONS:    Patient is being rescheduled as he did not present for appointment today.                                               No orders of the defined types were placed in this encounter.      No orders of the defined types were placed in this encounter.        SIGNED:    Peyton Najjar, MD         This note was generated by the Dragon speech recognition and may contain errors or omissions not intended by the user. Grammatical errors, random word insertions, deletions, pronoun errors, and incomplete sentences are occasional consequences of this technology due to software limitations. Not  all errors are caught or corrected. If there are questions or concerns about the content of this note or information contained within the body of this dictation, they should be addressed directly with the author for clarification.

## 2021-05-18 ENCOUNTER — Other Ambulatory Visit (INDEPENDENT_AMBULATORY_CARE_PROVIDER_SITE_OTHER): Payer: Self-pay | Admitting: Family Medicine

## 2021-05-18 DIAGNOSIS — I10 Essential (primary) hypertension: Secondary | ICD-10-CM

## 2021-05-22 ENCOUNTER — Encounter (INDEPENDENT_AMBULATORY_CARE_PROVIDER_SITE_OTHER): Payer: Self-pay

## 2021-05-27 ENCOUNTER — Encounter (INDEPENDENT_AMBULATORY_CARE_PROVIDER_SITE_OTHER): Payer: Self-pay | Admitting: Registered Nurse

## 2021-05-27 ENCOUNTER — Ambulatory Visit (INDEPENDENT_AMBULATORY_CARE_PROVIDER_SITE_OTHER): Payer: No Typology Code available for payment source | Admitting: Registered Nurse

## 2021-05-27 VITALS — BP 120/70 | HR 55 | Temp 98.0°F | Ht 70.0 in | Wt 165.0 lb

## 2021-05-27 DIAGNOSIS — M25561 Pain in right knee: Secondary | ICD-10-CM

## 2021-05-27 DIAGNOSIS — M1711 Unilateral primary osteoarthritis, right knee: Secondary | ICD-10-CM

## 2021-05-27 DIAGNOSIS — L03115 Cellulitis of right lower limb: Secondary | ICD-10-CM

## 2021-05-27 MED ORDER — POTASSIUM CHLORIDE 20 MEQ PO PACK
20.0000 meq | PACK | Freq: Every day | ORAL | 0 refills | Status: AC
Start: 2021-05-27 — End: 2021-06-01

## 2021-05-27 MED ORDER — FUROSEMIDE 20 MG PO TABS
20.0000 mg | ORAL_TABLET | Freq: Two times a day (BID) | ORAL | 0 refills | Status: DC
Start: 2021-05-27 — End: 2021-06-01

## 2021-05-27 MED ORDER — TRAMADOL HCL 50 MG PO TABS
50.0000 mg | ORAL_TABLET | Freq: Four times a day (QID) | ORAL | 0 refills | Status: AC | PRN
Start: 2021-05-27 — End: 2021-06-03

## 2021-05-27 MED ORDER — SULFAMETHOXAZOLE-TRIMETHOPRIM 800-160 MG PO TABS
1.0000 | ORAL_TABLET | Freq: Two times a day (BID) | ORAL | 0 refills | Status: AC
Start: 2021-05-27 — End: 2021-06-10

## 2021-05-27 NOTE — Progress Notes (Signed)
[facility]                       Date of Exam: 05/27/2021 7:22 AM        Patient ID: Brian Pena is a 75 y.o. male.  Attending Physician: Mayme Genta, FNP        Chief Complaint:    Chief Complaint   Patient presents with    Leg Pain     rash and swelling on the right leg, no pain  4 weeks ago               HPI:    85 yom presents for c/o leg pain. Pt states he has been very immobile the past few months due to severe degenerative knee disease. On exam right knee extremely swollen, almost to size of cantelope. Pt states this is his baseline with this knee and every month ortho pulls 75+mL of fluid from the joint. Pt needs as total knee done and is in consultation with ortho. Pts right lower leg has warm red rash with mild edema. Pt has been using hydrocortisone on the rash with no improvement. Discussed cellulitis. Discussed possible steroid taper to help with redness and inflammation but pt is a diabetic and states he is very sensitive to steroids. 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 Discussed admin, side effects, care and red flag sx.     Chief Complaint: Rash and swelling    Symptoms: rash and swelling on the right leg, no pain    When did they start?: 4 weeks ago    What have you tried?:    Any other concerns?:           Problem List:    Patient Active Problem List   Diagnosis    Transient global amnesia    Cardiomyopathy    Type 2 diabetes mellitus    Lumbar stenosis with neurogenic claudication    Neuropathy    Paroxysmal atrial fibrillation    Anemia    Basal cell carcinoma of skin    Benign prostatic hyperplasia with urinary obstruction    Essential hypertension    History of cerebrovascular accident    Hyperlipidemia    Low back pain    Numbness of hand    Peripheral vascular disease    Well adult health check    Infected sebaceous cyst    Urinary urgency    Epidermoid cyst    Urge incontinence    Gross hematuria    Acute cystitis with  hematuria             Current Meds:    Outpatient Medications Marked as Taking for the 05/27/21 encounter (Office Visit) with Mayme Genta, FNP   Medication Sig Dispense Refill    acetaminophen (TYLENOL) 500 MG tablet Take 500 mg by mouth every 6 (six) hours as needed for Pain      amLODIPine (NORVASC) 5 MG tablet TAKE 1 TABLET(5 MG) BY MOUTH DAILY 90 tablet 1    apixaban (ELIQUIS) 2.5 MG Take 1 tablet (2.5 mg total) by mouth every 12 (twelve) hours 60 tablet 1    CALCIUM PO Take 1,000 mg by mouth daily      glimepiride (AMARYL) 2 MG tablet TAKE 1 TABLET(2 MG) BY MOUTH EVERY MORNING 90 tablet 1    glucose blood test strip Accu-Chek Aviva Plus test strips      ibuprofen (ADVIL)  200 MG tablet Take 200 mg by mouth as needed for Pain      Lancets (accu-chek multiclix) lancets Accu-Chek Softclix Lancets      lisinopril (ZESTRIL) 20 MG tablet TAKE 1 TABLET BY MOUTH EVERY DAY 90 tablet 1    metFORMIN (GLUCOPHAGE) 1000 MG tablet TAKE 1 TABLET BY MOUTH TWICE DAILY 180 tablet 1    metoprolol tartrate (LOPRESSOR) 50 MG tablet TAKE 1 TABLET(50 MG) BY MOUTH DAILY 90 tablet 1    raNITIdine HCl (RANITIDINE 75 PO) Take 150 mg by mouth daily      rosuvastatin (CRESTOR) 5 MG tablet TAKE 1 TABLET BY MOUTH EVERY DAY 90 tablet 1    tamsulosin (FLOMAX) 0.4 MG Cap Take 0.4 mg by mouth 2 (two) times daily            Allergies:    Allergies   Allergen Reactions    Linezolid Nausea Only, Other (See Comments) and Anaphylaxis     weakness  Decreased appetite, nausea, fatigue.  Decreased appetite, nausea, fatigue.               Past Surgical History:    Past Surgical History:   Procedure Laterality Date    BACK SURGERY  2017    scoliosis and stenosis - 3 surgeries total    CERVICAL SPINE SURGERY  2019    KNEE SURGERY Left 10/08/1978    medial meniscectomy           Family History:    Family History   Problem Relation Age of Onset    Diabetes Mother     Heart disease Father     Stroke Father     Diabetes Father            Social History:     Social History     Tobacco Use    Smoking status: Former     Packs/day: 0.50     Years: 50.00     Pack years: 25.00     Types: Cigarettes     Quit date: 2014     Years since quitting: 8.7    Smokeless tobacco: Never   Vaping Use    Vaping Use: Never used   Substance Use Topics    Alcohol use: Not Currently     Alcohol/week: 0.0 standard drinks    Drug use: Never           The following sections were reviewed this encounter by the provider:   Tobacco  Allergies  Meds  Problems  Med Hx  Surg Hx  Fam Hx             Vital Signs:    BP 120/70 (BP Site: Left arm, Patient Position: Sitting, Cuff Size: Large)   Pulse (!) 55   Temp 98 F (36.7 C)   Ht 1.778 m (5\' 10" )   Wt 74.8 kg (165 lb)   SpO2 97%   BMI 23.68 kg/m          ROS:    Review of Systems   Constitutional: Negative.  Negative for chills, fatigue and fever.   HENT: Negative.  Negative for congestion, ear pain, postnasal drip, sinus pressure, sinus pain, sore throat, trouble swallowing and voice change.    Eyes: Negative.  Negative for photophobia and visual disturbance.   Respiratory: Negative.  Negative for cough, chest tightness and shortness of breath.    Cardiovascular: Negative.  Negative for chest pain, palpitations and leg swelling.   Gastrointestinal:  Negative.  Negative for abdominal pain, blood in stool, constipation, diarrhea, nausea and vomiting.   Endocrine: Negative.  Negative for cold intolerance, heat intolerance, polydipsia, polyphagia and polyuria.   Genitourinary: Negative.  Negative for decreased urine volume, difficulty urinating, dysuria, flank pain, frequency, hematuria, penile discharge, penile swelling, scrotal swelling, testicular pain and urgency.   Musculoskeletal:  Positive for joint swelling. Negative for arthralgias and myalgias.   Skin:  Positive for rash.   Allergic/Immunologic: Negative.  Negative for environmental allergies and food allergies.   Neurological: Negative.  Negative for dizziness, weakness,  light-headedness, numbness and headaches.   Hematological: Negative.  Negative for adenopathy. Does not bruise/bleed easily.   Psychiatric/Behavioral: Negative.  Negative for self-injury, sleep disturbance and suicidal ideas. The patient is not nervous/anxious.    All other systems reviewed and are negative.           Physical Exam:    Physical Exam  Vitals and nursing note reviewed.   Constitutional:       Appearance: Normal appearance. He is normal weight.   Musculoskeletal:         General: Swelling present.      Right lower leg: Edema (very mild non pitting) present.   Skin:     General: Skin is warm and dry.      Capillary Refill: Capillary refill takes less than 2 seconds.      Findings: Erythema (RLE cellulitis with no open wounds) present.   Neurological:      Mental Status: He is alert.   Psychiatric:         Mood and Affect: Mood normal.         Behavior: Behavior normal.         Thought Content: Thought content normal.         Judgment: Judgment normal.            Assessment:    1. Cellulitis of right lower extremity  - sulfamethoxazole-trimethoprim (BACTRIM DS) 800-160 MG per tablet; Take 1 tablet by mouth 2 (two) times daily for 14 days  Dispense: 28 tablet; Refill: 0  - furosemide (LASIX) 20 MG tablet; Take 1 tablet (20 mg total) by mouth 2 (two) times daily for 5 days  Dispense: 10 tablet; Refill: 0  - potassium chloride (KLOR-CON) 20 MEQ packet; Take 20 mEq by mouth daily for 5 days  Dispense: 5 packet; Refill: 0    2. Acute pain of right knee  - traMADol (ULTRAM) 50 MG tablet; Take 1 tablet (50 mg total) by mouth every 6 (six) hours as needed for Pain  Dispense: 28 tablet; Refill: 0    3. Arthritis of right knee          Plan:      Patient Instructions   Pt will take meds as prescribed, discussed care and red flag sxs, pt will fu in 1 week for check on progress, sooner prn             Follow-up:    Return if symptoms worsen or fail to improve.         Mayme Genta, FNP

## 2021-05-29 ENCOUNTER — Encounter (INDEPENDENT_AMBULATORY_CARE_PROVIDER_SITE_OTHER): Payer: Self-pay | Admitting: Registered Nurse

## 2021-05-29 NOTE — Patient Instructions (Signed)
Pt will take meds as prescribed, discussed care and red flag sxs, pt will fu in 1 week for check on progress, sooner prn

## 2021-06-05 ENCOUNTER — Encounter (INDEPENDENT_AMBULATORY_CARE_PROVIDER_SITE_OTHER): Payer: Self-pay | Admitting: Registered Nurse

## 2021-06-05 ENCOUNTER — Ambulatory Visit (INDEPENDENT_AMBULATORY_CARE_PROVIDER_SITE_OTHER): Payer: No Typology Code available for payment source | Admitting: Registered Nurse

## 2021-06-05 VITALS — BP 110/60 | HR 45 | Temp 98.1°F | Wt 165.0 lb

## 2021-06-05 DIAGNOSIS — M1711 Unilateral primary osteoarthritis, right knee: Secondary | ICD-10-CM

## 2021-06-05 DIAGNOSIS — Z23 Encounter for immunization: Secondary | ICD-10-CM

## 2021-06-05 DIAGNOSIS — I739 Peripheral vascular disease, unspecified: Secondary | ICD-10-CM

## 2021-06-05 DIAGNOSIS — F5101 Primary insomnia: Secondary | ICD-10-CM

## 2021-06-05 MED ORDER — TRAZODONE HCL 50 MG PO TABS
50.0000 mg | ORAL_TABLET | Freq: Every evening | ORAL | 0 refills | Status: DC
Start: 2021-06-05 — End: 2021-07-02

## 2021-06-05 NOTE — Patient Instructions (Signed)
Pt will get imaging, pt will fu with specialist, discussed care and red flag sxs, fu if sxs persist or worsen

## 2021-06-05 NOTE — Progress Notes (Signed)
[facility] [place] [facility]                       Date of Exam: 06/05/2021 2:11 PM        Patient ID: Brian Pena is a 75 y.o. male.  Attending Physician: Mayme Genta, FNP        Chief Complaint:    Chief Complaint   Patient presents with    Follow-up     Chronic Care-leg infection               HPI:    9 yom presents for fu leg swelling and rash. Leg erythema mildly improved on antbx but rash still present. Discussed if not cellulitis than rash likely related to PVD. Pt saw his ortho who stated he is not a candidate for total knee because of his circulatory issues. Discussed fu with a vascular MD and pt states he has an appt with one already next week. Discussed getting venous US of bilateral legs ahead of appt so they could discuss with specialist. Pt agreeable. Since pt is no longer candidate for total knee and has severe immobility due to arthritis discussed fu with pain management as well. Pt states he has the info for [facility] and will fu.     Insomnia - admits to trouble falling asleep at night because his knee is in so much pain. Tried tramadol prn and it did not help. Discussed not using tramadol at bedtime and trying trazodone. Discussed admin, side effects, care, and red flag sxs.     Chief Complaint:follow-up OV    Symptoms:leg infection    When did they start?:4-5 weeks    What have you tried?:taking abx    Any other concerns?:           Problem List:    Patient Active Problem List   Diagnosis    Transient global amnesia    Cardiomyopathy    Type 2 diabetes mellitus    Lumbar stenosis with neurogenic claudication    Neuropathy    Paroxysmal atrial fibrillation    Anemia    Basal cell carcinoma of skin    Benign prostatic hyperplasia with urinary obstruction    Essential hypertension    History of cerebrovascular accident    Hyperlipidemia    Low back pain    Numbness of hand    Peripheral vascular disease    Well adult health check    Infected sebaceous  cyst    Urinary urgency    Epidermoid cyst    Urge incontinence    Gross hematuria    Acute cystitis with hematuria             Current Meds:    Outpatient Medications Marked as Taking for the 06/05/21 encounter (Office Visit) with Mayme Genta, FNP   Medication Sig Dispense Refill    acetaminophen (TYLENOL) 500 MG tablet Take 500 mg by mouth every 6 (six) hours as needed for Pain      amLODIPine (NORVASC) 5 MG tablet TAKE 1 TABLET(5 MG) BY MOUTH DAILY 90 tablet 1    apixaban (ELIQUIS) 2.5 MG Take 1 tablet (2.5 mg total) by mouth every 12 (twelve) hours 60 tablet 1    CALCIUM PO Take 1,000 mg by mouth daily      glimepiride (AMARYL) 2 MG tablet TAKE 1 TABLET(2 MG) BY MOUTH EVERY MORNING 90 tablet 1    glucose blood test strip  Accu-Chek Aviva Plus test strips      ibuprofen (ADVIL) 200 MG tablet Take 200 mg by mouth as needed for Pain      Lancets (accu-chek multiclix) lancets Accu-Chek Softclix Lancets      lisinopril (ZESTRIL) 20 MG tablet TAKE 1 TABLET BY MOUTH EVERY DAY 90 tablet 1    metFORMIN (GLUCOPHAGE) 1000 MG tablet TAKE 1 TABLET BY MOUTH TWICE DAILY 180 tablet 1    metoprolol tartrate (LOPRESSOR) 50 MG tablet TAKE 1 TABLET(50 MG) BY MOUTH DAILY 90 tablet 1    Multiple Vitamin (MULTIVITAMIN ADULT PO) Take by mouth daily      raNITIdine HCl (RANITIDINE 75 PO) Take 150 mg by mouth daily      rosuvastatin (CRESTOR) 5 MG tablet TAKE 1 TABLET BY MOUTH EVERY DAY 90 tablet 1    sulfamethoxazole-trimethoprim (BACTRIM DS) 800-160 MG per tablet Take 1 tablet by mouth 2 (two) times daily for 14 days 28 tablet 0    tamsulosin (FLOMAX) 0.4 MG Cap Take 0.4 mg by mouth 2 (two) times daily      traMADol (ULTRAM) 50 MG tablet             Allergies:    Allergies   Allergen Reactions    Linezolid Nausea Only, Other (See Comments) and Anaphylaxis     weakness  Decreased appetite, nausea, fatigue.  Decreased appetite, nausea, fatigue.               Past Surgical History:    Past Surgical History:   Procedure Laterality Date     BACK SURGERY  2017    scoliosis and stenosis - 3 surgeries total    CERVICAL SPINE SURGERY  2019    KNEE SURGERY Left 10/08/1978    medial meniscectomy           Family History:    Family History   Problem Relation Age of Onset    Diabetes Mother     Heart disease Father     Stroke Father     Diabetes Father            Social History:    Social History     Tobacco Use    Smoking status: Former     Packs/day: 0.50     Years: 50.00     Pack years: 25.00     Types: Cigarettes     Quit date: 2014     Years since quitting: 8.7    Smokeless tobacco: Never   Vaping Use    Vaping Use: Never used   Substance Use Topics    Alcohol use: Not Currently     Alcohol/week: 0.0 standard drinks    Drug use: Never           The following sections were reviewed this encounter by the provider:   Tobacco   Allergies   Meds   Problems   Med Hx   Surg Hx   Fam Hx              Vital Signs:    BP 110/60    Pulse (!) 45    Temp 98.1 F (36.7 C)    Wt 74.8 kg (165 lb)    SpO2 99%    BMI 23.68 kg/m          ROS:    Review of Systems   Constitutional: Negative.  Negative for chills, fatigue and fever.   HENT: Negative.  Negative for congestion, ear  pain, postnasal drip, sinus pressure, sinus pain, sore throat, trouble swallowing and voice change.    Eyes: Negative.  Negative for photophobia and visual disturbance.   Respiratory: Negative.  Negative for cough, chest tightness and shortness of breath.    Cardiovascular: Negative.  Negative for chest pain, palpitations and leg swelling.   Gastrointestinal: Negative.  Negative for abdominal pain, blood in stool, constipation, diarrhea, nausea and vomiting.   Endocrine: Negative.  Negative for cold intolerance, heat intolerance, polydipsia, polyphagia and polyuria.   Genitourinary: Negative.  Negative for decreased urine volume, difficulty urinating, dysuria, flank pain, frequency, hematuria, penile discharge, penile swelling, scrotal swelling, testicular pain and urgency.   Musculoskeletal:   Positive for arthralgias, joint swelling and myalgias.   Skin:  Positive for rash.   Allergic/Immunologic: Negative.  Negative for environmental allergies and food allergies.   Neurological: Negative.  Negative for dizziness, weakness, light-headedness, numbness and headaches.   Hematological: Negative.  Negative for adenopathy. Does not bruise/bleed easily.   Psychiatric/Behavioral: Negative.  Negative for self-injury, sleep disturbance and suicidal ideas. The patient is not nervous/anxious.    All other systems reviewed and are negative.           Physical Exam:    Physical Exam  Vitals and nursing note reviewed.   Constitutional:       Appearance: Normal appearance. He is normal weight.   Musculoskeletal:         General: Tenderness (right knee) present. No swelling (improved since last visit - ortho pulled 50mL fluid off knee last week).   Skin:     General: Skin is warm and dry.      Capillary Refill: Capillary refill takes less than 2 seconds.      Findings: Erythema (right anterior lower calf rash mildly less reddened) present.   Neurological:      General: No focal deficit present.      Mental Status: He is alert and oriented to person, place, and time. Mental status is at baseline.   Psychiatric:         Mood and Affect: Mood normal.         Behavior: Behavior normal.         Thought Content: Thought content normal.         Judgment: Judgment normal.            Assessment:    1. Primary insomnia  - traZODone (DESYREL) 50 MG tablet; Take 1 tablet (50 mg total) by mouth nightly  Dispense: 30 tablet; Refill: 0    2. Peripheral vascular disease  - US Venous Low Extrem Duplx Dopp Comp Bilat; Future  - US Venous Low Extrem Duplx Dopp Comp Bilat    3. Need for viral immunization  - Tdap vaccine greater than or equal to 7yo IM  - Flu vaccine HIGH-DOSE QUAD (PF) 65 yrs and older    4. Arthritis of right knee          Plan:    Patient Instructions   Pt will get imaging, pt will fu with specialist, discussed care and  red flag sxs, fu if sxs persist or worsen              Follow-up:    Return if symptoms worsen or fail to improve.         Mayme Genta, FNP

## 2021-06-09 ENCOUNTER — Ambulatory Visit (INDEPENDENT_AMBULATORY_CARE_PROVIDER_SITE_OTHER): Payer: No Typology Code available for payment source

## 2021-06-09 DIAGNOSIS — I739 Peripheral vascular disease, unspecified: Secondary | ICD-10-CM

## 2021-06-10 NOTE — Progress Notes (Signed)
Conclusions: No evidence of deep vein thrombosis in bilateral lower extremities.   No evidence of superficial vein thrombosis in bilateral lower extremities.   All vessels duplexed appear patent and within normal limits.

## 2021-06-27 NOTE — Progress Notes (Signed)
Dear Mr. Esperanza Richters,  We received a copy of your vascular report from Dr. Nicoletta Ba. There is no blood clot on the right leg. You have reflux in the superficial veins (this means the blood flows backwards down the vein). This can lead to leg swelling. Please touch bases with Dr. Nicoletta Ba for his recommendations if you have not already discussed this with him.  Dr. Elwyn Lade

## 2021-07-02 ENCOUNTER — Ambulatory Visit (INDEPENDENT_AMBULATORY_CARE_PROVIDER_SITE_OTHER): Payer: No Typology Code available for payment source | Admitting: Family Medicine

## 2021-07-02 ENCOUNTER — Other Ambulatory Visit (INDEPENDENT_AMBULATORY_CARE_PROVIDER_SITE_OTHER): Payer: Self-pay | Admitting: Family Medicine

## 2021-07-02 ENCOUNTER — Other Ambulatory Visit (INDEPENDENT_AMBULATORY_CARE_PROVIDER_SITE_OTHER): Payer: Self-pay | Admitting: Registered Nurse

## 2021-07-02 DIAGNOSIS — F5101 Primary insomnia: Secondary | ICD-10-CM

## 2021-07-02 DIAGNOSIS — E1142 Type 2 diabetes mellitus with diabetic polyneuropathy: Secondary | ICD-10-CM

## 2021-07-02 NOTE — Telephone Encounter (Signed)
Pt is calling in to request an rx refill for [ Trazadone 50mg  ]    Days of medication left [# of days ]     Please send rx to [ [facility] ]    [ Additional information ]    Please assess and call back with update    Recent Visits  Date Type Provider Dept   06/05/21 Office Visit Mayme Genta, FNP [facility]   05/27/21 Office Visit Mayme Genta, FNP [facility]   04/07/21 Office Visit Elwyn Lade, Achille Rich, MD [facility]   Showing recent visits within past 270 days and meeting all other requirements  Future Appointments  No visits were found meeting these conditions.  Showing future appointments within next 90 days and meeting all other requirements

## 2021-07-02 NOTE — Telephone Encounter (Signed)
30 tablets, 1 refill pending for Trazodone        Last OV - 9/22  Requested Pharmacy - [facility]         NP Domingo Cocking - Please review. Thank you!

## 2021-07-03 MED ORDER — TRAZODONE HCL 50 MG PO TABS
50.0000 mg | ORAL_TABLET | Freq: Every evening | ORAL | 1 refills | Status: DC
Start: 2021-07-03 — End: 2021-08-02

## 2021-07-04 ENCOUNTER — Encounter (INDEPENDENT_AMBULATORY_CARE_PROVIDER_SITE_OTHER): Payer: Self-pay

## 2021-07-05 ENCOUNTER — Other Ambulatory Visit (INDEPENDENT_AMBULATORY_CARE_PROVIDER_SITE_OTHER): Payer: Self-pay | Admitting: Family Medicine

## 2021-07-05 DIAGNOSIS — I480 Paroxysmal atrial fibrillation: Secondary | ICD-10-CM

## 2021-09-16 ENCOUNTER — Encounter (INDEPENDENT_AMBULATORY_CARE_PROVIDER_SITE_OTHER): Payer: Self-pay | Admitting: Family Medicine

## 2021-09-22 ENCOUNTER — Other Ambulatory Visit (INDEPENDENT_AMBULATORY_CARE_PROVIDER_SITE_OTHER): Payer: Self-pay | Admitting: Registered Nurse

## 2021-09-22 DIAGNOSIS — F5101 Primary insomnia: Secondary | ICD-10-CM

## 2021-09-23 NOTE — Telephone Encounter (Signed)
30 days of meds sent.  Due for follow up .  Please call to schedule

## 2021-09-23 NOTE — Telephone Encounter (Signed)
Patient aware will call back to schedule.

## 2021-09-23 NOTE — Telephone Encounter (Signed)
30 tablets, 0 refill pending for traZODone (DESYREL) 50 MG tablet    Last OV - 06/05/21 OV for insomnia    Name of Pharmacy and Location - [id number] - [place], [place] - [address]    NP Colcher - Please review on behalf of NP Hamlin. Thank you!

## 2021-10-12 ENCOUNTER — Other Ambulatory Visit (INDEPENDENT_AMBULATORY_CARE_PROVIDER_SITE_OTHER): Payer: Self-pay | Admitting: Family Medicine

## 2021-10-12 DIAGNOSIS — I10 Essential (primary) hypertension: Secondary | ICD-10-CM

## 2021-10-12 DIAGNOSIS — E782 Mixed hyperlipidemia: Secondary | ICD-10-CM

## 2021-10-12 DIAGNOSIS — E1142 Type 2 diabetes mellitus with diabetic polyneuropathy: Secondary | ICD-10-CM

## 2021-10-13 NOTE — Telephone Encounter (Signed)
Spoke to pt, he will c/b to schedule appt

## 2021-10-13 NOTE — Telephone Encounter (Signed)
1st courtesy    90 tablets, 1 refill pending for metoprolol tartrate (LOPRESSOR) 50 MG tablet, rosuvastatin (CRESTOR) 5 MG tablet, and amLODIPine (NORVASC) 5 MG tablet    180 tablets, 1 refill pending for metFORMIN (GLUCOPHAGE) 1000 MG tablet    Last OV - 04/07/21 OV for chronic care f/u    Name of Pharmacy and Location [id number] - [place], [place] - [address]    Front desk- please call to schedule a routine follow-up    Dr. Elwyn Lade - Please review. Thank you!

## 2021-10-26 ENCOUNTER — Other Ambulatory Visit (INDEPENDENT_AMBULATORY_CARE_PROVIDER_SITE_OTHER): Payer: Self-pay | Admitting: Nurse Practitioner

## 2021-10-26 DIAGNOSIS — F5101 Primary insomnia: Secondary | ICD-10-CM

## 2021-10-27 NOTE — Telephone Encounter (Signed)
Hi guys,pls call and schedule  with Katie as advised.Thanks.

## 2021-10-27 NOTE — Telephone Encounter (Signed)
lmtcb

## 2021-10-27 NOTE — Telephone Encounter (Signed)
30 days of meds sent.  Due for follow up (recommend virtual on Wednesday) .  Please call to schedule

## 2021-11-09 ENCOUNTER — Other Ambulatory Visit (INDEPENDENT_AMBULATORY_CARE_PROVIDER_SITE_OTHER): Payer: Self-pay | Admitting: Family Medicine

## 2021-11-09 DIAGNOSIS — I10 Essential (primary) hypertension: Secondary | ICD-10-CM

## 2021-11-09 NOTE — Telephone Encounter (Signed)
Patient is due for follow-up diabetes, please get him scheduled.

## 2021-11-21 ENCOUNTER — Telehealth (INDEPENDENT_AMBULATORY_CARE_PROVIDER_SITE_OTHER): Payer: Self-pay | Admitting: Family Medicine

## 2021-11-21 ENCOUNTER — Other Ambulatory Visit (INDEPENDENT_AMBULATORY_CARE_PROVIDER_SITE_OTHER): Payer: Self-pay | Admitting: Nurse Practitioner

## 2021-11-21 DIAGNOSIS — F5101 Primary insomnia: Secondary | ICD-10-CM

## 2021-11-21 DIAGNOSIS — I10 Essential (primary) hypertension: Secondary | ICD-10-CM

## 2021-11-21 DIAGNOSIS — E1142 Type 2 diabetes mellitus with diabetic polyneuropathy: Secondary | ICD-10-CM

## 2021-11-21 DIAGNOSIS — Z0000 Encounter for general adult medical examination without abnormal findings: Secondary | ICD-10-CM

## 2021-11-21 NOTE — Telephone Encounter (Signed)
30 days of med sent.  Due for in person annual physical/BW.  Please call to schedule.

## 2021-11-21 NOTE — Telephone Encounter (Signed)
Pt scheduled 12/25/21

## 2021-11-21 NOTE — Telephone Encounter (Signed)
BLOOD WORK REQUEST    Patient is calling to request bw orders for upcoming visit on date: 12/25/2021    Reason for visit (be specific): WME--pt has [facility] plan that will cover physical       Lab Preference: [facility]-outside location      Please assess and call back with update.    Your request will be sent to the provider for review. He/she will review and decide if they will order before or after your visit. This request is not guaranteed.       Recent Visits  Date Type Provider Dept   03/17/21 Office Visit Staci Acosta, MD [facility]   Showing recent visits within past 270 days and meeting all other requirements  Future Appointments  No visits were found meeting these conditions.  Showing future appointments within next 90 days and meeting all other requirements

## 2021-11-25 ENCOUNTER — Encounter (INDEPENDENT_AMBULATORY_CARE_PROVIDER_SITE_OTHER): Payer: Self-pay | Admitting: Family Medicine

## 2021-11-25 DIAGNOSIS — Z0000 Encounter for general adult medical examination without abnormal findings: Secondary | ICD-10-CM

## 2021-11-25 DIAGNOSIS — I10 Essential (primary) hypertension: Secondary | ICD-10-CM

## 2021-11-25 DIAGNOSIS — E1142 Type 2 diabetes mellitus with diabetic polyneuropathy: Secondary | ICD-10-CM

## 2021-11-25 NOTE — Telephone Encounter (Signed)
Orders done, he can find them in my chart under "Upcoming Tests and Procedures".

## 2021-11-25 NOTE — Telephone Encounter (Signed)
Wife informed

## 2021-11-30 LAB — CBC AND DIFFERENTIAL
Baso(Absolute): 0 10*3/uL (ref 0.0–0.2)
Basophils Automated: 0 %
Eosinophils Absolute: 0.1 10*3/uL (ref 0.0–0.4)
Eosinophils Automated: 2 %
Hematocrit: 39.4 % (ref 37.5–51.0)
Hemoglobin: 13.3 g/dL (ref 13.0–17.7)
Immature Granulocytes Absolute: 0 10*3/uL (ref 0.0–0.1)
Immature Granulocytes: 0 %
Lymphocytes Absolute: 2 10*3/uL (ref 0.7–3.1)
Lymphocytes Automated: 32 %
MCH: 30.5 pg (ref 26.6–33.0)
MCHC: 33.8 g/dL (ref 31.5–35.7)
MCV: 90 fL (ref 79–97)
Monocytes Absolute: 0.6 10*3/uL (ref 0.1–0.9)
Monocytes: 9 %
Neutrophils Absolute Count: 3.5 10*3/uL (ref 1.4–7.0)
Neutrophils: 57 %
Platelets: 138 10*3/uL — ABNORMAL LOW (ref 150–450)
RBC: 4.36 x10E6/uL (ref 4.14–5.80)
RDW: 12.8 % (ref 11.6–15.4)
WBC: 6.2 10*3/uL (ref 3.4–10.8)

## 2021-11-30 LAB — HEMOGLOBIN A1C: Hemoglobin A1C: 6.3 % — ABNORMAL HIGH (ref 4.8–5.6)

## 2021-11-30 LAB — COMPREHENSIVE METABOLIC PANEL
ALT: 9 IU/L (ref 0–44)
AST (SGOT): 17 IU/L (ref 0–40)
Albumin/Globulin Ratio: 2.7 — ABNORMAL HIGH (ref 1.2–2.2)
Albumin: 4.6 g/dL (ref 3.7–4.7)
Alkaline Phosphatase: 62 IU/L (ref 44–121)
BUN / Creatinine Ratio: 22 (ref 10–24)
BUN: 22 mg/dL (ref 8–27)
Bilirubin, Total: 0.5 mg/dL (ref 0.0–1.2)
CO2: 23 mmol/L (ref 20–29)
Calcium: 9.8 mg/dL (ref 8.6–10.2)
Chloride: 107 mmol/L — ABNORMAL HIGH (ref 96–106)
Creatinine: 0.99 mg/dL (ref 0.76–1.27)
Globulin, Total: 1.7 g/dL (ref 1.5–4.5)
Glucose: 102 mg/dL — ABNORMAL HIGH (ref 70–99)
Potassium: 4 mmol/L (ref 3.5–5.2)
Protein, Total: 6.3 g/dL (ref 6.0–8.5)
Sodium: 144 mmol/L (ref 134–144)
eGFR: 78 mL/min/{1.73_m2} (ref >59)

## 2021-11-30 LAB — MICROALBUMIN, RANDOM URINE
Creatinine, UR: 91 mg/dL
Microalb/Crt. Ratio: 42 mg/g creat — ABNORMAL HIGH (ref 0–29)
Microalbumin, UR: 38.4 ug/mL

## 2021-11-30 LAB — LIPID PANEL
Cholesterol / HDL Ratio: 2.3 ratio (ref 0.0–5.0)
Cholesterol: 110 mg/dL (ref 100–199)
HDL: 48 mg/dL (ref >39)
LDL Chol Calculated (NIH): 48 mg/dL (ref 0–99)
Triglycerides: 64 mg/dL (ref 0–149)
VLDL Calculated: 14 mg/dL (ref 5–40)

## 2021-11-30 LAB — TSH: TSH: 1.48 u[IU]/mL (ref 0.450–4.500)

## 2021-12-01 NOTE — Progress Notes (Signed)
Dear Mr. Brian Pena,  Your urine microalbumin creatinine ratio is mildly elevated, but improved compared with last time. Your hemoglobin A1c shows excellent diabetic control. Your blood count is normal except for slightly low platelets, which you have had one to the past. Not a  clinically significant finding. Your blood chemistries are normal except for the mildly elevated sugar, chloride and albumin globulin ratio. Abnormalities of  this slight degree are not clinically significant. Your cholesterol is well controlled. Your thyroid is normal. Let me know if you have any questions.  Dr. Elwyn Lade

## 2021-12-16 ENCOUNTER — Other Ambulatory Visit (INDEPENDENT_AMBULATORY_CARE_PROVIDER_SITE_OTHER): Payer: Self-pay | Admitting: Nurse Practitioner

## 2021-12-16 DIAGNOSIS — F5101 Primary insomnia: Secondary | ICD-10-CM

## 2021-12-25 ENCOUNTER — Encounter (INDEPENDENT_AMBULATORY_CARE_PROVIDER_SITE_OTHER): Payer: Self-pay | Admitting: Family Medicine

## 2021-12-25 ENCOUNTER — Ambulatory Visit (INDEPENDENT_AMBULATORY_CARE_PROVIDER_SITE_OTHER): Payer: No Typology Code available for payment source | Admitting: Family Medicine

## 2021-12-25 VITALS — BP 110/72 | HR 75 | Temp 97.9°F | Resp 18 | Ht 70.0 in | Wt 156.0 lb

## 2021-12-25 DIAGNOSIS — Z87891 Personal history of nicotine dependence: Secondary | ICD-10-CM | POA: Insufficient documentation

## 2021-12-25 DIAGNOSIS — F321 Major depressive disorder, single episode, moderate: Secondary | ICD-10-CM

## 2021-12-25 DIAGNOSIS — E1142 Type 2 diabetes mellitus with diabetic polyneuropathy: Secondary | ICD-10-CM

## 2021-12-25 DIAGNOSIS — M48062 Spinal stenosis, lumbar region with neurogenic claudication: Secondary | ICD-10-CM

## 2021-12-25 DIAGNOSIS — R5383 Other fatigue: Secondary | ICD-10-CM

## 2021-12-25 DIAGNOSIS — Z0000 Encounter for general adult medical examination without abnormal findings: Secondary | ICD-10-CM

## 2021-12-25 DIAGNOSIS — I480 Paroxysmal atrial fibrillation: Secondary | ICD-10-CM

## 2021-12-25 DIAGNOSIS — Z1211 Encounter for screening for malignant neoplasm of colon: Secondary | ICD-10-CM

## 2021-12-25 HISTORY — DX: Personal history of nicotine dependence: Z87.891

## 2021-12-25 NOTE — Progress Notes (Addendum)
[facility] [place] [facility]                       Date of Exam: 12/25/2021 4:01 PM        Patient ID: Brian Pena is a 76 y.o. male.  Attending Physician: Staci Acosta, MD        Chief Complaint:    Chief Complaint   Patient presents with    Annual Exam     Colonoscopy:none               HPI:      Visit Type: Health Maintenance Visit    Brian Pena is a 76 year old male who presents for an annual wellness examination. He gives permission to record this visit. He is accompanied by his wife, Brian Pena.     The patient has experienced erectile dysfunction for many years. He has tried oral medication without benefit. He is established with a urology group in [place] and follows up yearly for frequent urination. He usually sees a Publishing rights manager and currently takes Flomax.     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 In the last 3 to 4 years, he has lost 70 to 80 percent of muscle strength throughout his body. He also has back pain of unknown etiology. His neurosurgeon had ordered an MRI, but the patient did not get it due to claustrophobia. He notes claustrophobia even with Xanax. He has not seen a neurologist regarding muscle atrophy but will consider a referral.     In 05/2021 or 06/2021, he was experiencing severe right knee pain and could not sleep well at night. He thinks he fell due to a lack of sleep and notes falling once a day for a while. He received an injection in his right knee and felt some relief. However, he asked Dr. Marikay Alar about knee replacement, and the physician said no due to the patient's lower extremity edema. He was evaluated by a vascular physician, who did not find anything to prevent him from undergoing surgery. He did not return to orthopedics because his right knee pain had improved. He has not fallen since getting adequate sleep with trazodone and pain medication. However, his right knee pain is returning and waking him up at night, so he plans  to make an appointment with Dr. Graciela Husbands for another injection. His last injection was approximately 7 months ago. The pain prevents him from going out of the house much due to the trouble of taking the rollator walker or scooter.     He is experiencing numbness in his left foot. He feels like his sock is still on after he removes it.     [name] states that the patient's most bothersome issue is balance. He is agreeable to physical therapy. He ambulates well with his rollator walker. However, if he uses a cane, he needs to hold onto something else, and use both hands.     The patient is established with pain management, who prescribes hydrocodone.     He has persistent numbness, tingling, and weakness in his hands and feet. He has undergone carpal tunnel surgery on bilateral wrists in the past. He notes continued numbness but improvement in nerve pain. He is right-hand dominant but cannot use his right hand due to numbness.     Brian Pena easily gets 7 to 8 hours of sleep per night. He does not participate in an activity where he needs a helmet, he  wears a seat belt in the car, and does not text and drive.     He has a 20-pack-year history of smoking and quit in 2014. He does not drink alcohol. He does not drive anymore due to knee pain. He can exercise by walking to the end of his driveway. He was concerned about walking up the ramp to the office today, but he made it with the assistance of hydrocodone. Last visit, he called staff to assist him with a wheelchair. His last eye exam was 2 years ago. He plans to schedule an appointment soon.     The patient has cardiomyopathy and is established with Dr. Octavio Graves. His ejection fraction on echocardiogram from 09/2020 was improved to 60 to 65 percent.     He previously had an episode of gross hematuria.  He has not experienced hematuria since his dose of Eliquis was reduced from 5 mg to 2.5 mg.      His hemoglobin A1c was 6.3 percent, and his glucose was 101. His  urine microalbumin creatinine ratio was slightly elevated but improved from the previous year at 42. His thyroid and cholesterol were well controlled. He has a history of anemia, so he started taking ferrous sulfate 325 mg. His iron was within normal limits, and he is not bothered by constipation. He attributes the anemia to previous surgeries. He reports a low appetite.     Brian Pena can hear most conversations. If someone has an accent and is wearing a mask, he cannot hear them well. His last hearing test was at [facility].     [name] is concerned about the patient's depression. His PDQ-9 test indicated depression. He acknowledges feeling depressed at times every day. He has not contemplated suicide. He notes some improvement with trazadone but understands this is not a great antidepressant. He will consider starting an antidepressant at his next visit and keep a daily gratitude journal.    Few years systems is otherwise negative.    Cancer screening:   Colon cancer (45-75): He will complete a fecal occult test.    Cancer screening:   Colon cancer (45-75): last colonoscopy  FIT test 11/30/2019    Vaccines:   Immunization History   Administered Date(s) Administered    COVID-19 mRNA MONOVALENT vaccine PRIMARY SERIES 12 years and above Proofreader) 30 mcg/0.3 mL (DILUTE BEFORE USE) 12/06/2019, 12/27/2019, 08/08/2020    Hepatitis A ( Adult) 06/03/2010    INFLUENZA HIGH DOSE 65 YRS+ 07/01/2017    INFLUENZA HIGH DOSE 65 YRS+ Quad 0.7 mL 06/05/2021    INFLUENZA QUADRIVALENT ADJUVANTED PF 65 YRS & OLDER (FLUAD) 05/20/2015, 06/15/2016, 06/21/2018, 06/16/2019, 07/11/2020    INFLUENZA TRIVALENT, ADJUVANTED, PF 65 YRS & OLDER (FLUAD) 06/15/2016, 06/02/2017    Influenza quadrivalent (IM) 6 months & up PRESERVED (Afluria/Fluzone) 05/20/2015, 06/15/2016, 06/21/2018    Pneumococcal 23 valent 01/13/2010, 11/29/2017    Pneumococcal Conjugate 13-Valent 08/05/2016    Tdap 05/09/2011, 06/05/2021     PHQ-2: pos             Problem  List:    Patient Active Problem List   Diagnosis    Transient global amnesia    Cardiomyopathy    Type 2 diabetes mellitus    Lumbar stenosis with neurogenic claudication    Neuropathy    Paroxysmal atrial fibrillation    Anemia    Basal cell carcinoma of skin    Benign prostatic hyperplasia with urinary obstruction    Essential hypertension    History  of cerebrovascular accident    Hyperlipidemia    Low back pain    Numbness of hand    Peripheral vascular disease    Encounter for preventive health examination    Urinary urgency    Epidermoid cyst    Urge incontinence    History of smoking    Current moderate episode of major depressive disorder without prior episode             Current Meds:    Outpatient Medications Marked as Taking for the 12/25/21 encounter (Office Visit) with Staci Acosta, MD   Medication Sig Dispense Refill    acetaminophen (TYLENOL) 500 MG tablet Take 500 mg by mouth every 6 (six) hours as needed for Pain      amLODIPine (NORVASC) 5 MG tablet TAKE 1 TABLET(5 MG) BY MOUTH DAILY 90 tablet 1    CALCIUM PO Take 1,000 mg by mouth daily      glucose blood test strip Accu-Chek Aviva Plus test strips      HYDROcodone-acetaminophen (NORCO) 5-325 MG per tablet TAKE 1 TABLET BY MOUTH 1 TO 2 TIMES DAILY FOR SEVERE PAIN      ibuprofen (ADVIL) 200 MG tablet Take 200 mg by mouth as needed for Pain      Lancets (accu-chek multiclix) lancets Accu-Chek Softclix Lancets      lisinopril (ZESTRIL) 20 MG tablet TAKE 1 TABLET BY MOUTH EVERY DAY 90 tablet 0    metFORMIN (GLUCOPHAGE) 1000 MG tablet TAKE 1 TABLET BY MOUTH TWICE DAILY 180 tablet 1    Multiple Vitamin (MULTIVITAMIN ADULT PO) Take by mouth daily      raNITIdine HCl (RANITIDINE 75 PO) Take 150 mg by mouth daily      rosuvastatin (CRESTOR) 5 MG tablet TAKE 1 TABLET BY MOUTH EVERY DAY 90 tablet 1    tamsulosin (FLOMAX) 0.4 MG Cap Take 0.4 mg by mouth 2 (two) times daily      traMADol (ULTRAM) 50 MG tablet       traZODone (DESYREL) 50 MG tablet TAKE 1  TABLET(50 MG) BY MOUTH EVERY NIGHT 30 tablet 0    triamcinolone (KENALOG) 0.1 % cream APPLY TOPICALLY TO THE AFFECTED AREA TWICE DAILY FOR 14 DAYS      [DISCONTINUED] Eliquis 2.5 MG TAKE 1 TABLET(2.5 MG) BY MOUTH EVERY 12 HOURS 180 tablet 1    [DISCONTINUED] glimepiride (AMARYL) 2 MG tablet TAKE 1 TABLET(2 MG) BY MOUTH EVERY MORNING 90 tablet 1    [DISCONTINUED] metoprolol tartrate (LOPRESSOR) 50 MG tablet TAKE 1 TABLET(50 MG) BY MOUTH DAILY 90 tablet 1          Allergies:    Allergies   Allergen Reactions    Linezolid Nausea Only, Other (See Comments) and Anaphylaxis     weakness  Decreased appetite, nausea, fatigue.  Decreased appetite, nausea, fatigue.             Past Surgical History:    Past Surgical History:   Procedure Laterality Date    BACK SURGERY  2017    scoliosis and stenosis - 3 surgeries total    CERVICAL SPINE SURGERY  2019    KNEE SURGERY Left 10/08/1978    medial meniscectomy           Family History:    Family History   Problem Relation Age of Onset    Diabetes Mother     Heart disease Father     Stroke Father     Diabetes Father  Social History:    Social History     Tobacco Use    Smoking status: Former     Packs/day: 0.50     Years: 50.00     Pack years: 25.00     Types: Cigarettes     Quit date: 2014     Years since quitting: 9.3    Smokeless tobacco: Never   Vaping Use    Vaping status: Never Used   Substance Use Topics    Alcohol use: Not Currently     Alcohol/week: 0.0 standard drinks of alcohol    Drug use: Never           The following sections were reviewed this encounter by the provider:            Vital Signs:    BP 110/72   Pulse 75   Temp 97.9 F (36.6 C)   Resp 18   Ht 1.778 m (5\' 10" )   Wt 70.8 kg (156 lb)   SpO2 99%   BMI 22.38 kg/m            Physical Exam:    Constitutional:       General: Not in acute distress.     Appearance: Normal appearance. Not ill-appearing.   HENT:      Right Ear: Tympanic membrane, ear canal and external ear normal. Mild cerumen  buildup.     Left Ear: Tympanic membrane, ear canal and external ear normal. Mild cerumen buildup.      Nose: Nose normal.      Mouth/Throat:      Mouth: Mucous membranes are moist.      Pharynx: Oropharynx is clear. No posterior oropharyngeal erythema.   Eyes:      General: No scleral icterus.     Conjunctiva/sclera: Conjunctivae normal.   Cardiovascular:      Rate and Rhythm: Normal rate and regular rhythm.      Heart sounds: Normal heart sounds. No murmur heard.    No gallop.   Pulmonary:      Effort: Pulmonary effort is normal. No respiratory distress.      Breath sounds: No wheezing, rhonchi or rales.   Chest: It sounds like he is in atrial fibrillation today. An EKG will be performed.   Breasts:     Right: Normal. No mass, skin change or tenderness.      Left: Normal. No mass, skin change or tenderness.   Abdominal:      General: There is no distension.      Palpations: Abdomen is soft. There is no mass.      Tenderness: There is no abdominal tenderness. There is no guarding.      Hernia: No hernia is present.   Musculoskeletal:      Cervical back: No muscular tenderness. Pitting edema in the lower third of bilateral legs.   Lymphadenopathy:      Cervical: No cervical adenopathy.      Right cervical: No superficial cervical adenopathy.     Left cervical: No superficial cervical adenopathy.      Upper Body:      Right upper body: No axillary adenopathy.      Left upper body: No axillary adenopathy.   Skin:     General: Skin is warm and dry. 1 x 2 cm sebaceous cyst on the left on the left anterior chest between the sternum and nipple.   Neurological:      Mental Status: Alert.  Gait is  antalgic and he uses a walker for ambulation.  Psychiatric:         Attention and Perception: Attention normal.         Mood and Affect: Affect normal.    Feet are without lesions.  Sensation on the sole of his right foot is intact monofilament.  Sensation on the sole of his left foot is diminished.   DP pulse +2 on the right.    DP pulse + 1 on the left.      Collection Time: 12/25/21 12:09 PM   Result Value Ref Range    Ventricular Rate 54 BPM    Atrial Rate 32 BPM    P-R Interval  ms    QRS Duration 102 ms    Q-T Interval 436 ms    QTC Calculation (Bezet) 413 ms    P Axis  degrees    R Axis -38 degrees    T Axis -18 degrees    IHS MUSE NARRATIVE AND IMPRESSION       ATRIAL FIBRILLATION WITH SLOW VENTRICULAR RESPONSE  LEFT AXIS DEVIATION  ABNORMAL ECG  NO PREVIOUS ECGS AVAILABLE  Left axis, atrial fibrillation with slow ventricular response   Confirmed by Rolinda Roan ([id number]) on 01/11/2022 2:48:39 PM             Assessment:    1. Encounter for preventive health examination    2. Type 2 diabetes mellitus with diabetic polyneuropathy, without long-term current use of insulin    3. History of smoking  - CT SCREENING LUNG LOW DOSE; Future    4. Lumbar stenosis with neurogenic claudication  - Ambulatory referral to Physical Therapy; Future    5. Screen for colon cancer  - Stool Occult Blood, Immunoassay, (Not Guaiac Based); Future    6. Fatigue, unspecified type  - ECG 12 lead Normal    7. Paroxysmal atrial fibrillation  - Ambulatory referral to Cardiology; Future    8. Current moderate episode of major depressive disorder without prior episode          Plan:    1. Annual wellness exam.   - A screening lung CT was ordered for his history of smoking.   - Patient prefers a fecal occult blood test in lieu of colonoscopy. This was ordered today.     2. Type 2 diabetes.   - This is currently well controlled. Continue current medication.     3. Hypertension.   - This is currently well controlled. Continue current medication. He is up to date on lab tests.     4. Right knee pain.   - He is established with Dr. Graciela Husbands who provides routine injections of his right knee pain.     5. Cardiomyopathy.   - His cardiologist retired in 08/2021 and has not yet established care with another cardiologist.   - Patient's ejection fraction was improved from 40 to  50 percent to 60 to 65 percent on echocardiogram from 09/2020.     6. Atrial fibrillation.   -History of atrial fibrillation, currently in A-fib today. EKG today showed he is in atrial fibrillation with a slow response.   - We discussed the importance of following up with cardiology. Dr. Octavio Graves has retired so a referral was sent to Dr. Deretha Emory or Dr. Eliseo Gum.     7. Sebaceous cyst.   - A referral has been ordered for [facility].     8. Spinal stenosis.  - He is experiencing  increased falling. This has improved since he has been sleeping better. However, he ambulates with the assistance of a rollator walker.  - We will refer him to physical therapy for strengthening and balance.     9. Depression.   - He admits to feeling depressed at some point every day but is not suicidal. He takes trazodone for sleep and feels like it has slightly improved his mood as well.   - Patient is not interested in medication currently. He will consider keeping a gratitude journal. We will revisit this issue when he returns in a couple of months.     Patient will return in 2 months for review of upcoming tests and referral visits.   He will return for routine follow up in 6 months.           Follow-up:    Return in about 2 months (around 02/24/2022) for routine follow-up.         Staci Acosta, MD    Transcribed for Dr. Rolinda Roan, by Lowella Curb on 12/25/2021 at 2:44 PM. Powered by [employer].

## 2021-12-26 ENCOUNTER — Encounter (INDEPENDENT_AMBULATORY_CARE_PROVIDER_SITE_OTHER): Payer: Self-pay | Admitting: Family Medicine

## 2021-12-26 DIAGNOSIS — Z1211 Encounter for screening for malignant neoplasm of colon: Secondary | ICD-10-CM

## 2021-12-31 ENCOUNTER — Encounter (INDEPENDENT_AMBULATORY_CARE_PROVIDER_SITE_OTHER): Payer: Self-pay | Admitting: Cardiovascular Disease

## 2021-12-31 ENCOUNTER — Other Ambulatory Visit (INDEPENDENT_AMBULATORY_CARE_PROVIDER_SITE_OTHER): Payer: Self-pay | Admitting: Family Medicine

## 2021-12-31 ENCOUNTER — Ambulatory Visit (INDEPENDENT_AMBULATORY_CARE_PROVIDER_SITE_OTHER): Payer: No Typology Code available for payment source | Admitting: Cardiovascular Disease

## 2021-12-31 VITALS — BP 141/76 | HR 64 | Ht 70.0 in | Wt 154.0 lb

## 2021-12-31 DIAGNOSIS — E1142 Type 2 diabetes mellitus with diabetic polyneuropathy: Secondary | ICD-10-CM

## 2021-12-31 DIAGNOSIS — I4819 Other persistent atrial fibrillation: Secondary | ICD-10-CM

## 2021-12-31 DIAGNOSIS — I10 Essential (primary) hypertension: Secondary | ICD-10-CM

## 2021-12-31 DIAGNOSIS — E785 Hyperlipidemia, unspecified: Secondary | ICD-10-CM

## 2021-12-31 MED ORDER — METOPROLOL SUCCINATE ER 25 MG PO TB24
25.0000 mg | ORAL_TABLET | Freq: Every day | ORAL | 3 refills | Status: DC
Start: 2021-12-31 — End: 2022-12-23

## 2021-12-31 NOTE — Progress Notes (Signed)
[facility] OFF[facility] PROGRESS NOTE    HRT [facility] OFFICE -CARDIOLOGY  [address]  [place] [address]  Dept: [phone]  Dept Fax: [phone]       Patient Name: Brian Pena, Brian Pena    Date of Visit:  December 31, 2021  Date of Birth: 05-22-1946  AGE: 76 y.o.  Medical Record #: [id number]  Requesting Physician: Staci Acosta, MD      CHIEF COMPLAINT: Follow-up and Atrial Fibrillation (Paroxysmal)      HISTORY OF PRESENT ILLNESS:    He is a pleasant 76 y.o. male who presents today for a follow-up visit.  The patient was last seen in February 2022.  At that time he was in persistent atrial fibrillation and placed on Eliquis as well as metoprolol for heart rate control.  The atrial fibrillation now appears to be persistent.  He is asymptomatic.  The Eliquis dosage was cut back to 2.5 mg twice daily as he was having hematuria.  On the lower dosage of Eliquis, the hematuria is not recurred.  He denies any symptoms of angina or decompensated heart failure.  Functional status is limited due to multiple back surgeries and uses a rolling walker for that, as well as poor balance.  He does not monitor his blood pressure at home.  His recent ECG with his PCP did show a pause which precipitated the follow-up visit.  He denies any lightheadedness, dizziness, syncope, or near syncope.      PAST MEDICAL HISTORY: He has a past medical history of Anemia, Cerebrovascular accident, Diabetes mellitus, Hyperlipidemia, and Hypertension. He has a past surgical history that includes Knee surgery (Left, 10/08/1978); Back surgery (2017); and Cervical spine surgery (2019).    ALLERGIES:   Allergies   Allergen Reactions    Linezolid Nausea Only, Other (See Comments) and Anaphylaxis     weakness  Decreased appetite, nausea, fatigue.  Decreased appetite, nausea, fatigue.         MEDICATIONS:   Patient's current medications were reviewed. ONLY Cardiac medications were updated  unless others were addressed in assessment and plan.    Current Outpatient Medications:     acetaminophen (TYLENOL) 500 MG tablet, Take 500 mg by mouth every 6 (six) hours as needed for Pain, Disp: , Rfl:     amLODIPine (NORVASC) 5 MG tablet, TAKE 1 TABLET(5 MG) BY MOUTH DAILY, Disp: 90 tablet, Rfl: 1    CALCIUM PO, Take 1,000 mg by mouth daily, Disp: , Rfl:     Eliquis 2.5 MG, TAKE 1 TABLET(2.5 MG) BY MOUTH EVERY 12 HOURS, Disp: 180 tablet, Rfl: 1    glimepiride (AMARYL) 2 MG tablet, TAKE 1 TABLET(2 MG) BY MOUTH EVERY MORNING, Disp: 90 tablet, Rfl: 1    glucose blood test strip, Accu-Chek Aviva Plus test strips, Disp: , Rfl:     HYDROcodone-acetaminophen (NORCO) 5-325 MG per tablet, TAKE 1 TABLET BY MOUTH 1 TO 2 TIMES DAILY FOR SEVERE PAIN, Disp: , Rfl:     ibuprofen (ADVIL) 200 MG tablet, Take 200 mg by mouth as needed for Pain, Disp: , Rfl:     Lancets (accu-chek multiclix) lancets, Accu-Chek Softclix Lancets, Disp: , Rfl:     lisinopril (ZESTRIL) 20 MG tablet, TAKE 1 TABLET BY MOUTH EVERY DAY, Disp: 90 tablet, Rfl: 0    metFORMIN (GLUCOPHAGE) 1000 MG tablet, TAKE 1 TABLET BY MOUTH TWICE DAILY, Disp: 180 tablet, Rfl: 1    Multiple Vitamin (MULTIVITAMIN ADULT PO), Take by  mouth daily, Disp: , Rfl:     raNITIdine HCl (RANITIDINE 75 PO), Take 150 mg by mouth daily, Disp: , Rfl:     rosuvastatin (CRESTOR) 5 MG tablet, TAKE 1 TABLET BY MOUTH EVERY DAY, Disp: 90 tablet, Rfl: 1    tamsulosin (FLOMAX) 0.4 MG Cap, Take 0.4 mg by mouth 2 (two) times daily, Disp: , Rfl:     traMADol (ULTRAM) 50 MG tablet, , Disp: , Rfl:     traZODone (DESYREL) 50 MG tablet, TAKE 1 TABLET(50 MG) BY MOUTH EVERY NIGHT, Disp: 30 tablet, Rfl: 0    triamcinolone (KENALOG) 0.1 % cream, APPLY TOPICALLY TO THE AFFECTED AREA TWICE DAILY FOR 14 DAYS, Disp: , Rfl:     metoprolol succinate XL (TOPROL-XL) 25 MG 24 hr tablet, Take 1 tablet (25 mg) by mouth daily, Disp: 90 tablet, Rfl: 3     FAMILY HISTORY: family history includes Diabetes in his father  and mother; Heart disease in his father; Stroke in his father.    SOCIAL HISTORY: He reports that he quit smoking about 9 years ago. His smoking use included cigarettes. He has a 25.00 pack-year smoking history. He has never used smokeless tobacco. He reports that he does not currently use alcohol. He reports that he does not use drugs.    PHYSICAL EXAMINATION    Visit Vitals  BP 141/76 (BP Site: Right arm, Patient Position: Sitting, Cuff Size: Medium)   Pulse 64   Ht 1.778 m (5\' 10" )   Wt 69.9 kg (154 lb)   BMI 22.10 kg/m       Constitutional: Cooperative, alert, no acute distress.  Neck: No carotid bruits, JVP normal.  Cardiac: Irregularly irregular, normal S1 and S2; no S3 or S4, no murmurs, no rubs, no gallops.  Pulmonary: Clear to auscultation bilaterally, no wheezing, no rhonchi, no rales.  Extremities: Trace to 1+ pitting edema, very mild early venous stasis changes  Vascular: +2 pulses in radial artery bilaterally    ECG: 12/25/2021 - Slow atrial fibrillation, left axis      LABS:   Lab Results   Component Value Date    WBC 6.2 11/28/2021    HGB 13.3 11/28/2021    HCT 39.4 11/28/2021    PLT 138 (L) 11/28/2021     Lab Results   Component Value Date    GLU 102 (H) 11/28/2021    BUN 22 11/28/2021    CREAT 0.99 11/28/2021    NA 144 11/28/2021    K 4.0 11/28/2021    CL 107 (H) 11/28/2021    CO2 23 11/28/2021    AST 17 11/28/2021    ALT 9 11/28/2021     Lab Results   Component Value Date    TSH 1.480 11/28/2021    HGBA1C 6.3 (H) 11/28/2021     Lab Results   Component Value Date    CHOL 110 11/28/2021    TRIG 64 11/28/2021    HDL 48 11/28/2021    LDL 48 11/28/2021         Most recent echo and nuclear study reviewed.    Echo Feb 2022 - EF 60-65%, mild LVH, LAE      IMPRESSION:   [name] is a 76 y.o. 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  Decrease metoprolol to 25 mg daily, and switch to succinate versus tartrate.  I have asked him and his wife to monitor his blood pressure and pulse rate at least 3 times a week.  If the blood pressure spikes with the lowering of the metoprolol, his lisinopril and/or amlodipine could be increased.  I am concerned the amlodipine may also be contributing to some of his pedal edema.  Follow-up in 6 months, sooner if anything changes clinically.                                                     Orders Placed This Encounter   Procedures    Office Visit (HRT [place])       Orders Placed This Encounter   Medications    metoprolol succinate XL (TOPROL-XL) 25 MG 24 hr tablet     Sig: Take 1 tablet (25 mg) by mouth daily     Dispense:  90 tablet     Refill:  3       SIGNED:    Exie Parody, MD, [facility], FSCAI          This note was generated by the Dragon speech recognition and may contain errors or omissions not intended by the user. Grammatical errors, random word insertions, deletions, pronoun errors, and incomplete sentences are occasional consequences of this technology due to software limitations. Not all errors are caught or corrected. If there are questions or concerns about the content of this note or information contained within the body of this dictation, they should be addressed directly with the author for clarification.

## 2021-12-31 NOTE — Telephone Encounter (Signed)
90 tablets, refill pending for glimepiride (AMARYL) 2 MG tablet    Last OV - (12/25/2021)    Name of Pharmacy and Location [id number] - [place], [place] - [address]      NP Colcher - Please review in Dr. Elwyn Lade absence. Thank you!

## 2022-01-02 ENCOUNTER — Other Ambulatory Visit (INDEPENDENT_AMBULATORY_CARE_PROVIDER_SITE_OTHER): Payer: Self-pay | Admitting: Family Medicine

## 2022-01-06 ENCOUNTER — Other Ambulatory Visit (INDEPENDENT_AMBULATORY_CARE_PROVIDER_SITE_OTHER): Payer: Self-pay | Admitting: Family Medicine

## 2022-01-06 DIAGNOSIS — I480 Paroxysmal atrial fibrillation: Secondary | ICD-10-CM

## 2022-01-07 LAB — STOOL OCCULT BLOOD, IMMUNOASSAY, (NOT GUAIAC BASED): Occult Blood, Fecal, IA: NEGATIVE

## 2022-01-07 NOTE — Telephone Encounter (Signed)
180 tablets, 1 refill pending for Eliquis 2.5 MG    Last OV - 12/25/21 and scheduled for 02/26/22    Preferred Pharmacy: [facility] [place]    Dr. Elwyn Lade- Please review. Thank you!

## 2022-01-11 DIAGNOSIS — F321 Major depressive disorder, single episode, moderate: Secondary | ICD-10-CM | POA: Insufficient documentation

## 2022-01-11 HISTORY — DX: Major depressive disorder, single episode, moderate: F32.1

## 2022-01-11 LAB — ECG 12-LEAD
Atrial Rate: 32 {beats}/min
Q-T Interval: 436 ms
QRS Duration: 102 ms
QTC Calculation (Bezet): 413 ms
R Axis: -38 degrees
T Axis: -18 degrees
Ventricular Rate: 54 {beats}/min

## 2022-01-11 NOTE — Addendum Note (Signed)
Addended by: Rolinda Roan I on: 01/11/2022 02:55 PM     Modules accepted: Level of Service

## 2022-01-11 NOTE — Progress Notes (Signed)
Dear Mr. Brian Pena,  Your test for blood in the stool is negative.  Let me know if you have any questions.  Dr. Elwyn Lade

## 2022-01-12 ENCOUNTER — Other Ambulatory Visit (INDEPENDENT_AMBULATORY_CARE_PROVIDER_SITE_OTHER): Payer: Self-pay | Admitting: Nurse Practitioner

## 2022-01-12 DIAGNOSIS — F5101 Primary insomnia: Secondary | ICD-10-CM

## 2022-01-13 NOTE — Telephone Encounter (Signed)
30 tablets, 0 refill pending for Trazodone    Last OV - 4/23    Name of Pharmacy and Location - [facility]    Front desk-please schedule an appointment    Dr Elwyn Lade - Please review. Thank you!

## 2022-01-29 ENCOUNTER — Telehealth (INDEPENDENT_AMBULATORY_CARE_PROVIDER_SITE_OTHER): Payer: Self-pay | Admitting: Family Medicine

## 2022-01-29 NOTE — Telephone Encounter (Signed)
disregard

## 2022-02-03 ENCOUNTER — Other Ambulatory Visit (INDEPENDENT_AMBULATORY_CARE_PROVIDER_SITE_OTHER): Payer: Self-pay | Admitting: Family Medicine

## 2022-02-03 DIAGNOSIS — I10 Essential (primary) hypertension: Secondary | ICD-10-CM

## 2022-02-25 ENCOUNTER — Ambulatory Visit (INDEPENDENT_AMBULATORY_CARE_PROVIDER_SITE_OTHER): Payer: No Typology Code available for payment source | Admitting: Family Medicine

## 2022-02-26 ENCOUNTER — Ambulatory Visit (INDEPENDENT_AMBULATORY_CARE_PROVIDER_SITE_OTHER): Payer: No Typology Code available for payment source | Admitting: Family Medicine

## 2022-04-04 ENCOUNTER — Other Ambulatory Visit (INDEPENDENT_AMBULATORY_CARE_PROVIDER_SITE_OTHER): Payer: Self-pay | Admitting: Nurse Practitioner

## 2022-04-04 DIAGNOSIS — E1142 Type 2 diabetes mellitus with diabetic polyneuropathy: Secondary | ICD-10-CM

## 2022-04-05 NOTE — Telephone Encounter (Signed)
Appears to be your patient

## 2022-04-10 ENCOUNTER — Other Ambulatory Visit (INDEPENDENT_AMBULATORY_CARE_PROVIDER_SITE_OTHER): Payer: Self-pay | Admitting: Family Medicine

## 2022-04-10 DIAGNOSIS — E1142 Type 2 diabetes mellitus with diabetic polyneuropathy: Secondary | ICD-10-CM

## 2022-04-10 DIAGNOSIS — I10 Essential (primary) hypertension: Secondary | ICD-10-CM

## 2022-04-10 DIAGNOSIS — E782 Mixed hyperlipidemia: Secondary | ICD-10-CM

## 2022-04-10 NOTE — Telephone Encounter (Signed)
Lmtcb.

## 2022-04-10 NOTE — Telephone Encounter (Signed)
Patient will be due for chronic care follow-up in October, please get him scheduled.

## 2022-04-14 ENCOUNTER — Encounter (INDEPENDENT_AMBULATORY_CARE_PROVIDER_SITE_OTHER): Payer: Self-pay | Admitting: Family Medicine

## 2022-06-22 ENCOUNTER — Ambulatory Visit (INDEPENDENT_AMBULATORY_CARE_PROVIDER_SITE_OTHER): Payer: No Typology Code available for payment source | Admitting: Cardiology

## 2022-07-03 ENCOUNTER — Other Ambulatory Visit (INDEPENDENT_AMBULATORY_CARE_PROVIDER_SITE_OTHER): Payer: Self-pay | Admitting: Family Medicine

## 2022-07-03 DIAGNOSIS — I480 Paroxysmal atrial fibrillation: Secondary | ICD-10-CM

## 2022-07-04 NOTE — Telephone Encounter (Signed)
Patient is due for periodic follow-up and Medicare wellness visit, please get him scheduled, any provider.

## 2022-07-06 NOTE — Telephone Encounter (Signed)
Pt's wife advised that pt will be due for a f/u prior to additional refills

## 2022-07-08 ENCOUNTER — Other Ambulatory Visit (INDEPENDENT_AMBULATORY_CARE_PROVIDER_SITE_OTHER): Payer: Self-pay | Admitting: Family Medicine

## 2022-07-08 DIAGNOSIS — F5101 Primary insomnia: Secondary | ICD-10-CM

## 2022-08-27 ENCOUNTER — Ambulatory Visit (INDEPENDENT_AMBULATORY_CARE_PROVIDER_SITE_OTHER): Payer: No Typology Code available for payment source | Admitting: Cardiovascular Disease

## 2022-09-10 ENCOUNTER — Other Ambulatory Visit (INDEPENDENT_AMBULATORY_CARE_PROVIDER_SITE_OTHER): Payer: Self-pay | Admitting: Family Medicine

## 2022-09-10 DIAGNOSIS — I10 Essential (primary) hypertension: Secondary | ICD-10-CM

## 2022-09-14 ENCOUNTER — Ambulatory Visit (INDEPENDENT_AMBULATORY_CARE_PROVIDER_SITE_OTHER): Payer: No Typology Code available for payment source

## 2022-09-14 ENCOUNTER — Encounter (INDEPENDENT_AMBULATORY_CARE_PROVIDER_SITE_OTHER): Payer: Self-pay

## 2022-09-14 VITALS — BP 102/64 | HR 75 | Temp 97.9°F | Ht 70.0 in | Wt 150.0 lb

## 2022-09-14 DIAGNOSIS — D649 Anemia, unspecified: Secondary | ICD-10-CM

## 2022-09-14 DIAGNOSIS — M1711 Unilateral primary osteoarthritis, right knee: Secondary | ICD-10-CM

## 2022-09-14 DIAGNOSIS — E782 Mixed hyperlipidemia: Secondary | ICD-10-CM

## 2022-09-14 DIAGNOSIS — I10 Essential (primary) hypertension: Secondary | ICD-10-CM

## 2022-09-14 DIAGNOSIS — M48062 Spinal stenosis, lumbar region with neurogenic claudication: Secondary | ICD-10-CM

## 2022-09-14 DIAGNOSIS — G629 Polyneuropathy, unspecified: Secondary | ICD-10-CM

## 2022-09-14 DIAGNOSIS — E1142 Type 2 diabetes mellitus with diabetic polyneuropathy: Secondary | ICD-10-CM

## 2022-09-14 DIAGNOSIS — I739 Peripheral vascular disease, unspecified: Secondary | ICD-10-CM

## 2022-09-14 DIAGNOSIS — Z0000 Encounter for general adult medical examination without abnormal findings: Secondary | ICD-10-CM

## 2022-09-14 DIAGNOSIS — Z87891 Personal history of nicotine dependence: Secondary | ICD-10-CM

## 2022-09-14 DIAGNOSIS — I4819 Other persistent atrial fibrillation: Secondary | ICD-10-CM

## 2022-09-14 DIAGNOSIS — F321 Major depressive disorder, single episode, moderate: Secondary | ICD-10-CM

## 2022-09-14 NOTE — Progress Notes (Signed)
[facility]                       Date of Exam: 09/14/2022 4:00 PM        Patient ID: Brian Pena is a 77 y.o. male.  Attending Physician: Birdena Jubilee, FNP        Chief Complaint:    Chief Complaint   Patient presents with    Medication Management     29mo follow up  Elequis f/u               HPI:    77 Y.O. male in for chronic care follow up.   Has hx of hypertension, A-fib, type 2 diabetes, hyperlipidemia, Insomnia, Major Depressive disorder    Hypertension  Currently taking Amlodipine 5 mg   Fluctuates 100/ 60 130/80    Diabetes   Checked his fasting BG 2 days ago = 79   Usually is between 90-100  Eye appointment - Hasn't been in 2-3 years   Does not go to see a podiatrist  Has neuropathy  No longer drives due to the numbness    Hyperlipidemia   Takes 5 mg rosuvastatin  Denies side effects    Afib   Sees Cardiologist Annually, next appointment feb 5th   Eliquis is working well    Wife is concerned about balance   He uses a Teaching laboratory technician  Has had many falls  Has knee pain, is unable to have surgery due to balance problems  Has had multiple back surgeries  Falls often   Knee pain  Unable to have knee surgery due to balance problems     Insomnia  Takes trazodone - also for depression    Hx of BPH  Takes Flomax    Hx of nicotine dependence  Does not want to get a low dose lung CT    Declines Shingrix and Tdap vaccines    Was a PA for 43 years   Was in the second graduating class of PA's    Married 52 years                       Problem List:    Patient Active Problem List   Diagnosis    Transient global amnesia    Cardiomyopathy    Type 2 diabetes mellitus with diabetic polyneuropathy, without long-term current use of insulin    Lumbar stenosis with neurogenic claudication    Neuropathy    Paroxysmal atrial fibrillation    Anemia    Basal cell carcinoma of skin    Benign prostatic hyperplasia with urinary obstruction    Essential hypertension    History of  cerebrovascular accident    Hyperlipidemia    Low back pain    Numbness of hand    Peripheral vascular disease    Encounter for preventive health examination    Epidermoid cyst    Urge incontinence    History of smoking    Current moderate episode of major depressive disorder without prior episode             Current Meds:    Outpatient Medications Marked as Taking for the 09/14/22 encounter (Office Visit) with Birdena Jubilee, FNP   Medication Sig Dispense Refill    acetaminophen (TYLENOL) 500 MG tablet Take 1 tablet (500 mg) by mouth every 6 (six) hours as needed for Pain      amLODIPine (  NORVASC) 5 MG tablet TAKE 1 TABLET(5 MG) BY MOUTH DAILY 90 tablet 1    apixaban (Eliquis) 2.5 MG TAKE 1 TABLET(2.5 MG) BY MOUTH EVERY 12 HOURS 180 tablet 0    CALCIUM PO Take 1,000 mg by mouth daily      glimepiride (AMARYL) 2 MG tablet TAKE 1 TABLET(2 MG) BY MOUTH EVERY MORNING 90 tablet 1    HYDROcodone-acetaminophen (NORCO) 5-325 MG per tablet TAKE 1 TABLET BY MOUTH 1 TO 2 TIMES DAILY FOR SEVERE PAIN      Lancets (accu-chek multiclix) lancets Accu-Chek Softclix Lancets      lisinopril (ZESTRIL) 20 MG tablet TAKE 1 TABLET BY MOUTH EVERY DAY 90 tablet 1    metFORMIN (GLUCOPHAGE) 1000 MG tablet TAKE 1 TABLET BY MOUTH TWICE DAILY 180 tablet 1    metoprolol succinate XL (TOPROL-XL) 25 MG 24 hr tablet Take 1 tablet (25 mg) by mouth daily 90 tablet 3    raNITIdine HCl (RANITIDINE 75 PO) Take 150 mg by mouth daily      rosuvastatin (CRESTOR) 5 MG tablet TAKE 1 TABLET BY MOUTH EVERY DAY 90 tablet 1    tamsulosin (FLOMAX) 0.4 MG Cap Take 1 capsule (0.4 mg) by mouth 2 (two) times daily      traMADol (ULTRAM) 50 MG tablet       traZODone (DESYREL) 50 MG tablet TAKE 1 TABLET(50 MG) BY MOUTH EVERY NIGHT 90 tablet 1    [DISCONTINUED] glucose blood test strip Accu-Chek Aviva Plus test strips            Allergies:    Allergies   Allergen Reactions    Linezolid Nausea Only, Other (See Comments) and Anaphylaxis     weakness  Decreased appetite,  nausea, fatigue.  Decreased appetite, nausea, fatigue.               Past Surgical History:    Past Surgical History:   Procedure Laterality Date    BACK SURGERY  2017    scoliosis and stenosis - 3 surgeries total    CERVICAL SPINE SURGERY  2019    KNEE SURGERY Left 10/08/1978    medial meniscectomy           Family History:    Family History   Problem Relation Age of Onset    Diabetes Mother     Heart disease Father     Stroke Father     Diabetes Father            Social History:    Social History     Tobacco Use    Smoking status: Former     Packs/day: 0.50     Years: 50.00     Additional pack years: 0.00     Total pack years: 25.00     Types: Cigarettes     Quit date: 2014     Years since quitting: 10.0    Smokeless tobacco: Never   Vaping Use    Vaping Use: Never used   Substance Use Topics    Alcohol use: Not Currently     Alcohol/week: 0.0 standard drinks of alcohol    Drug use: Never           The following sections were reviewed this encounter by the provider:   Tobacco  Allergies  Meds  Problems  Med Hx  Surg Hx  Fam Hx             Vital Signs:    BP 102/64 (BP  Site: Left arm, Patient Position: Sitting, Cuff Size: Medium)   Pulse 75   Temp 97.9 F (36.6 C)   Ht 1.778 m (5\' 10" )   Wt 68 kg (150 lb)   SpO2 99%   BMI 21.52 kg/m          ROS:    Review of Systems   Per HPI        Physical Exam:    Physical Exam  Vitals and nursing note reviewed.   Constitutional:       General: He is not in acute distress.     Appearance: Normal appearance. He is normal weight. He is not ill-appearing.   HENT:      Head: Normocephalic and atraumatic.      Right Ear: Tympanic membrane normal.      Left Ear: Tympanic membrane normal.      Nose: Nose normal.      Mouth/Throat:      Mouth: Mucous membranes are moist.      Pharynx: Oropharynx is clear. No oropharyngeal exudate or posterior oropharyngeal erythema.   Eyes:      Conjunctiva/sclera: Conjunctivae normal.      Pupils: Pupils are equal, round, and reactive  to light.   Cardiovascular:      Rate and Rhythm: Normal rate and regular rhythm.      Pulses: Normal pulses.      Heart sounds: Normal heart sounds.   Pulmonary:      Effort: Pulmonary effort is normal.      Breath sounds: Normal breath sounds.   Abdominal:      Palpations: Abdomen is soft.   Musculoskeletal:      Cervical back: Normal range of motion.   Skin:     General: Skin is warm and dry.   Neurological:      Mental Status: He is alert and oriented to person, place, and time.   Psychiatric:         Mood and Affect: Mood normal.         Behavior: Behavior normal.         Thought Content: Thought content normal.         Judgment: Judgment normal.              Assessment:    1. Encounter for preventive health examination    2. Peripheral vascular disease    3. Type 2 diabetes mellitus with diabetic polyneuropathy, without long-term current use of insulin  - Hemoglobin A1C; Future    4. Neuropathy    5. Essential hypertension  - Comprehensive metabolic panel; Future  - Urine Microalbumin Random; Future    6. Mixed hyperlipidemia  - Lipid panel; Future    7. Current moderate episode of major depressive disorder without prior episode    8. Persistent atrial fibrillation    9. History of smoking    10. Arthritis of right knee    11. Lumbar stenosis with neurogenic claudication    12. Anemia, unspecified type  - CBC without differential; Future            Plan:    Health Maintenance Reminder/AVS provided to pt  Importance of tobacco, excessive alcohol, illicit drug use reviewed  Sunscreen encouraged when exposed to sun, to reduce skin cancer risk  Seat belt use encouraged  Encouraged routine eye exam thru Ophthalmology or Optometry  Encouraged routine dental exams, cleaning 2x/yr  Importance of moderate exercise at least 5x/week and a low fat, high  fiber diet that includes 5 servings of fruits and vegetables a day is discussed  HM labs as ordered.   Next well exam in 1 year     Diabetes:  - Refills provided, stable  on current meds  - Fasting labs ordered to monitor med, DM  - Discussed neuropathic pain associated w/ T2DM. Recommend regularly examining feet for abnormalities/injuries. Do not walk barefoot. Keep feet clean and dry.   - Discussed risks of MI, CVA, end organ damage. Goals of therapy reviewed  - Encouraged heart healthy low carbohydrate diet, low fat diet and weight loss efforts thru regular exercise at least 5x/wk   - Recommend yearly diabetic eye exam   - Increase exercise and continue with a healthy, balanced diet  - If stable follow up in 6 months, sooner if needed     Hypertension:  Continue taking medications as prescribed.   Check BP's at home periodically and keep log of readings.   Bring log to next visit. Contact provider if BP elevated or low.   Pathology of hypertension reviewed with pt today.   Risks/complications including MI, CVA, end organ damage discussed with pt.   Goals of therapy reviewed.  Encouraged heart healthy diet and regular exercise at least 5x/wk   Follow up 6-12 months pending lab results .      Hyperlipidemia:  Discussed diet and Exercise, eating a low fat diet, fruits and vegetables.   Discussed when to contact the office if experiencing muscle pain.   Continue taking medication as prescribed.   Follow up in 6 months.                  Follow-up:    Return in about 6 months (around 03/15/2023).         Jolene Provost, FNP

## 2022-09-16 ENCOUNTER — Telehealth (INDEPENDENT_AMBULATORY_CARE_PROVIDER_SITE_OTHER): Payer: Self-pay

## 2022-09-16 NOTE — Telephone Encounter (Signed)
Pt is calling in to request an rx refill for accucheck test strips  Days of medication left [0    Please send rx to [  [id number] - [place], [place] - [address]       [ Additional information ]    Please assess and call back with update    Recent Visits  Date Type Provider Dept   09/14/22 Office Visit Jolene Provost, [place]   12/25/21 Office Visit Heide Scales, Lyndel Safe, MD [place] recent visits within past 270 days and meeting all other requirements  Future Appointments  No visits were found meeting these conditions.  Showing future appointments within next 90 days and meeting all other requirements

## 2022-09-17 MED ORDER — GLUCOSE BLOOD VI STRP
ORAL_STRIP | 3 refills | Status: DC
Start: 2022-09-17 — End: 2023-05-08

## 2022-09-21 ENCOUNTER — Encounter (INDEPENDENT_AMBULATORY_CARE_PROVIDER_SITE_OTHER): Payer: Self-pay

## 2022-09-28 ENCOUNTER — Telehealth (INDEPENDENT_AMBULATORY_CARE_PROVIDER_SITE_OTHER): Payer: Self-pay

## 2022-09-28 NOTE — Telephone Encounter (Signed)
Pt is calling states they are out of test strips and pharm states they have been trying to contact office needs to have direction on how many times a day to test pls advise and call.

## 2022-09-29 ENCOUNTER — Telehealth (INDEPENDENT_AMBULATORY_CARE_PROVIDER_SITE_OTHER): Payer: Self-pay

## 2022-09-29 NOTE — Telephone Encounter (Signed)
[facility] calling in to clarify directions on the rx for the glucose blood test strips that was submitted on 1/11. They would like to know how often patient should be checking his levels. Pls amend the rx with clarification on how often he should be testing and submit to [facility]

## 2022-09-29 NOTE — Telephone Encounter (Signed)
Please advise 

## 2022-10-01 NOTE — Telephone Encounter (Signed)
I have called and spoken with pharmacy. TY

## 2022-10-01 NOTE — Telephone Encounter (Signed)
I have spoken with [facility] pharmacy to clarify directions. TY

## 2022-10-02 ENCOUNTER — Other Ambulatory Visit (INDEPENDENT_AMBULATORY_CARE_PROVIDER_SITE_OTHER): Payer: Self-pay | Admitting: Family Medicine

## 2022-10-02 DIAGNOSIS — I480 Paroxysmal atrial fibrillation: Secondary | ICD-10-CM

## 2022-10-06 ENCOUNTER — Other Ambulatory Visit (INDEPENDENT_AMBULATORY_CARE_PROVIDER_SITE_OTHER): Payer: Self-pay | Admitting: Family Medicine

## 2022-10-06 DIAGNOSIS — E1142 Type 2 diabetes mellitus with diabetic polyneuropathy: Secondary | ICD-10-CM

## 2022-10-07 ENCOUNTER — Other Ambulatory Visit (INDEPENDENT_AMBULATORY_CARE_PROVIDER_SITE_OTHER): Payer: Self-pay | Admitting: Family Medicine

## 2022-10-07 DIAGNOSIS — I10 Essential (primary) hypertension: Secondary | ICD-10-CM

## 2022-10-07 DIAGNOSIS — E782 Mixed hyperlipidemia: Secondary | ICD-10-CM

## 2022-10-07 DIAGNOSIS — E1142 Type 2 diabetes mellitus with diabetic polyneuropathy: Secondary | ICD-10-CM

## 2022-10-11 NOTE — Progress Notes (Signed)
SUBJECTIVE:  Brian Pena; [id number]  Age: 77 y.o. Sex: male    Chief Complaint: Follow-up of the Right Knee    History of present illness: Brian Pena with a PMHx of TIA, stroke, MRSA, HTN, DM, and atrial fibrillation presents today for a follow up of right knee pain. The patient was last seen by me on 07/13/22 at which time I provided a right knee cortisone injection and aspiration (45mL) were provided. The patient reports that the injection provided pain relief and he inquires for a repeat injection today. He reports that he has fallen multiple times over the last 2 weeks. He is accompanied with his wife. He presents with a walker for ambulation.     Pain score:8  out of 10    Patient Care Team:  Lillia Pauls, MD as PCP - General (Family Medicine)  Glennon Mac, MD (Vascular Surgery)    Review of Systems   10/12/2022    Constitutional: Unexplained: Negative  Genitourinary: Frequent Urination: Positive  HEENT: Vision Loss: Negative  Neurological: Memory Loss: Negative  Integumentary: Rash: Negative  Cardiovascular: Palpatations: Negative  Hematologic: Bruises/Bleeds Easily: Positive  Gastrointestinal: Constipation: Negative  Immunological: Seasonal Allergies: Negative  Musculoskeletal: Joint Pain: Positive    OBJECTIVE:  Constitutional: No acute distress. Well nourished. Well developed. His body mass index is 22.96 kg/m.  Eyes:  Sclera are nonicteric.  Respiratory:  No labored breathing.  Cardiovascular:  No marked edema.  Skin:  No marked skin ulcers.  Neurological:  No marked sensory loss noted.  Psychiatric: Alert and oriented x3.  Musculoskeletal     Right Knee:  ~80cc effusion    IMAGING / STUDIES:         No imaging obtained   ASSESSMENT:  (M17.11) Primary osteoarthritis of right knee  (primary encounter diagnosis)  (M25.561,  G89.29) Chronic pain of right knee    PLAN:  I have discussed the examination findings with the patient, and have recommended we proceed conservatively at this time with a right knee  aspiration and cortisone injection. I discussed the risks and benefits of cortisone injections including heightened risk of elevated blood pressure due to HTN and blood sugar due to DM and the patient verbally expressed understanding and is electing with the procedure. The patient tolerated the injection well with no immediate adverse effects. Follow up PRN.    Large Joint Arthrocentesis: R knee  Performed by: Waynette Buttery, MD  Authorized by: Waynette Buttery, MD      Consent given by: patient  Site marked: site marked  Timeout: Immediately prior to procedure a time out was called to verify the correct patient, procedure, equipment, support staff and site/side marked as required   Supporting Documentation  Indications: pain and diagnostic evaluation   Procedure Details  Location:  Knee R knee   Preparation: Patient was prepped and draped in the usual sterile fashion, ethylchloride used.  Needle size: 22 G    Approach: anterolateral  Aspirate amount: 75 mL  Aspirate: serosanguinous and clear  Patient tolerance: patient tolerated the procedure well with no immediate complications    Medications administered: 2 mL Triamcinolone 40 MG/ML; 3 mL lidocaine 1 %    Patient Education: Diabetes Risk Discussed, Cortisone Flare Risk Discussed and Patient Education Distributed      Orders Placed This Encounter   . Large Joint Arthrocentesis     Return if symptoms worsen or fail to improve.    I, Waynette Buttery, MD, personally, performed the services described in this documentation, as  scribed in my presence, and it is both accurate and complete.  Scribed by: Andrey Spearman

## 2022-10-12 ENCOUNTER — Ambulatory Visit (INDEPENDENT_AMBULATORY_CARE_PROVIDER_SITE_OTHER): Payer: No Typology Code available for payment source | Admitting: Cardiovascular Disease

## 2022-10-12 ENCOUNTER — Encounter (INDEPENDENT_AMBULATORY_CARE_PROVIDER_SITE_OTHER): Payer: Self-pay | Admitting: Cardiovascular Disease

## 2022-10-12 VITALS — BP 124/72 | HR 79 | Ht 70.0 in | Wt 154.0 lb

## 2022-10-12 DIAGNOSIS — E785 Hyperlipidemia, unspecified: Secondary | ICD-10-CM

## 2022-10-12 DIAGNOSIS — I4819 Other persistent atrial fibrillation: Secondary | ICD-10-CM

## 2022-10-12 DIAGNOSIS — I10 Essential (primary) hypertension: Secondary | ICD-10-CM

## 2022-10-12 MED ORDER — AMLODIPINE BESYLATE 5 MG PO TABS
2.5000 mg | ORAL_TABLET | Freq: Every day | ORAL | 1 refills | Status: DC
Start: 2022-10-12 — End: 2023-01-14

## 2022-10-12 NOTE — Progress Notes (Signed)
[facility] OFF[facility] PROGRESS NOTE    [facility] OFF[facility] -CARDIOLOGY  [place] [address]  Dept: [phone]  Dept Fax: [phone]       Patient Name: Brian Pena, Brian Pena    Date of Visit:  October 12, 2022  Date of Birth: 28-Apr-1946  AGE: 77 y.o.  Medical Record #: [id number]  Requesting Physician: Jolene Provost, FNP      CHIEF COMPLAINT: Follow-up      HISTORY OF PRESENT ILLNESS:    He is a pleasant 77 y.o. male who presents today for a follow-up visit.  The patient was last seen in April 2023.  For the most part, from a cardiac standpoint, he is doing well.  No symptoms of angina or decompensated heart failure.  Does report some worsening lower extremity swelling.  Is typically worse at the end of the day.  He has not been using compression stockings very often, but is using no slip socks given to him by his daughter.  No adverse bleeding or bruising with the Eliquis.  No hematuria.      PAST MEDICAL HISTORY: He has a past medical history of Anemia, Cerebrovascular accident, Diabetes mellitus, Hyperlipidemia, and Hypertension. He has a past surgical history that includes Knee surgery (Left, 10/08/1978); Back surgery (2017); and Cervical spine surgery (2019).    ALLERGIES:   Allergies   Allergen Reactions    Linezolid Nausea Only, Other (See Comments) and Anaphylaxis     weakness  Decreased appetite, nausea, fatigue.  Decreased appetite, nausea, fatigue.         MEDICATIONS:   Patient's current medications were reviewed. ONLY Cardiac medications were updated unless others were addressed in assessment and plan.    Current Outpatient Medications:     acetaminophen (TYLENOL) 500 MG tablet, Take 1 tablet (500 mg) by mouth every 6 (six) hours as needed for Pain, Disp: , Rfl:     CALCIUM PO, Take 1,000 mg by mouth daily, Disp: , Rfl:     Eliquis 2.5 MG, TAKE 1 TABLET(2.5 MG) BY MOUTH EVERY 12 HOURS, Disp: 180 tablet, Rfl: 0    glimepiride  (AMARYL) 2 MG tablet, TAKE 1 TABLET(2 MG) BY MOUTH EVERY MORNING, Disp: 90 tablet, Rfl: 1    glucose blood test strip, Accu-Chek Aviva Plus test strips, Disp: 100 each, Rfl: 3    HYDROcodone-acetaminophen (NORCO) 5-325 MG per tablet, TAKE 1 TABLET BY MOUTH 1 TO 2 TIMES DAILY FOR SEVERE PAIN, Disp: , Rfl:     Lancets (accu-chek multiclix) lancets, Accu-Chek Softclix Lancets, Disp: , Rfl:     lisinopril (ZESTRIL) 20 MG tablet, TAKE 1 TABLET BY MOUTH EVERY DAY, Disp: 90 tablet, Rfl: 1    metFORMIN (GLUCOPHAGE) 1000 MG tablet, TAKE 1 TABLET BY MOUTH TWICE DAILY, Disp: 180 tablet, Rfl: 1    metoprolol succinate XL (TOPROL-XL) 25 MG 24 hr tablet, Take 1 tablet (25 mg) by mouth daily, Disp: 90 tablet, Rfl: 3    raNITIdine HCl (RANITIDINE 75 PO), Take 150 mg by mouth daily, Disp: , Rfl:     rosuvastatin (CRESTOR) 5 MG tablet, TAKE 1 TABLET BY MOUTH EVERY DAY, Disp: 90 tablet, Rfl: 1    tamsulosin (FLOMAX) 0.4 MG Cap, Take 1 capsule (0.4 mg) by mouth 2 (two) times daily, Disp: , Rfl:     traMADol (ULTRAM) 50 MG tablet, , Disp: , Rfl:     traZODone (DESYREL) 50 MG tablet, TAKE 1 TABLET(50 MG)  BY MOUTH EVERY NIGHT, Disp: 90 tablet, Rfl: 1    amLODIPine (NORVASC) 5 MG tablet, Take 0.5 tablets (2.5 mg) by mouth daily, Disp: 90 tablet, Rfl: 1     FAMILY HISTORY: family history includes Diabetes in his father and mother; Heart disease in his father; Stroke in his father.    SOCIAL HISTORY: He reports that he quit smoking about 10 years ago. His smoking use included cigarettes. He has a 25.00 pack-year smoking history. He has never used smokeless tobacco. He reports that he does not currently use alcohol. He reports that he does not use drugs.    PHYSICAL EXAMINATION    Visit Vitals  BP 124/72 (BP Site: Right arm, Patient Position: Sitting, Cuff Size: Small)   Pulse 79   Ht 1.778 m (5\' 10" )   Wt 69.9 kg (154 lb)   SpO2 98%   BMI 22.10 kg/m       Constitutional: Cooperative, alert, no acute distress.  Neck: No carotid bruits, JVP  normal.  Cardiac: Irregularly irregular, normal S1 and S2; no S3 or S4. No murmurs. No rubs, no gallops.  Pulmonary: Clear to auscultation bilaterally, no wheezing, no rhonchi, no rales.  Extremities: Trace to 1+ pitting edema.  Vascular: +2 pulses in radial artery bilaterally, 2+ pedal pulses bilaterally.      LABS REVIEWED:   Lab Results   Component Value Date    WBC 6.2 11/28/2021    HGB 13.3 11/28/2021    HCT 39.4 11/28/2021    PLT 138 (L) 11/28/2021     Lab Results   Component Value Date    GLU 102 (H) 11/28/2021    BUN 22 11/28/2021    CREAT 0.99 11/28/2021    NA 144 11/28/2021    K 4.0 11/28/2021    CL 107 (H) 11/28/2021    CO2 23 11/28/2021    AST 17 11/28/2021    ALT 9 11/28/2021     Lab Results   Component Value Date    TSH 1.480 11/28/2021    HGBA1C 6.3 (H) 11/28/2021     Lab Results   Component Value Date    CHOL 110 11/28/2021    TRIG 64 11/28/2021    HDL 48 11/28/2021    LDL 48 11/28/2021         Most recent echo and nuclear study reviewed.    Echo Feb 2022 - Mild LVH, EF 60-65%, LAE, indeterminate diastolic function      IMPRESSION:   Brian Pena is a 77 y.o. 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  Encouraged to elevate his legs whenever possible, as well as use other supportive socks, compression stockings, or the no slip socks.  If the swelling does not improve completely, we can likely stop the amlodipine and adjust his other blood pressure medications as necessary.  Follow-up in 6  months.  Orders Placed This Encounter   Procedures    Office Visit ([facility])       Orders Placed This Encounter   Medications    amLODIPine (NORVASC) 5 MG tablet     Sig: Take 0.5 tablets (2.5 mg) by mouth daily     Dispense:  90 tablet     Refill:  1       SIGNED:    Mal Misty, MD, [facility], FSCAI          This note was generated by the Dragon speech recognition and may contain errors or omissions not intended by the user. Grammatical errors, random word insertions, deletions, pronoun errors, and incomplete sentences are occasional consequences of this technology due to software limitations. Not all errors are caught or corrected. If there are questions or concerns about the content of this note or information contained within the body of this dictation, they should be addressed directly with the author for clarification.

## 2022-10-19 LAB — COMPREHENSIVE METABOLIC PANEL
ALT: 14 IU/L (ref 0–44)
AST (SGOT): 12 IU/L (ref 0–40)
Albumin/Globulin Ratio: 2.4 — ABNORMAL HIGH (ref 1.2–2.2)
Albumin: 4.4 g/dL (ref 3.8–4.8)
Alkaline Phosphatase: 60 IU/L (ref 44–121)
BUN / Creatinine Ratio: 28 — ABNORMAL HIGH (ref 10–24)
BUN: 25 mg/dL (ref 8–27)
Bilirubin, Total: 0.5 mg/dL (ref 0.0–1.2)
CO2: 22 mmol/L (ref 20–29)
Calcium: 10 mg/dL (ref 8.6–10.2)
Chloride: 103 mmol/L (ref 96–106)
Creatinine: 0.9 mg/dL (ref 0.76–1.27)
Globulin, Total: 1.8 g/dL (ref 1.5–4.5)
Glucose: 106 mg/dL — ABNORMAL HIGH (ref 70–99)
Potassium: 4.7 mmol/L (ref 3.5–5.2)
Protein, Total: 6.2 g/dL (ref 6.0–8.5)
Sodium: 141 mmol/L (ref 134–144)
eGFR: 87 mL/min/{1.73_m2} (ref >59)

## 2022-10-19 LAB — URINE MICROALBUMIN, RANDOM
Creatinine, UR: 62.3 mg/dL
Microalb/Crt. Ratio: 121 mg/g creat — ABNORMAL HIGH (ref 0–29)
Microalbumin, UR: 75.4 ug/mL

## 2022-10-19 LAB — CBC
Hematocrit: 41.3 % (ref 37.5–51.0)
Hemoglobin: 13.7 g/dL (ref 13.0–17.7)
MCH: 30.5 pg (ref 26.6–33.0)
MCHC: 33.2 g/dL (ref 31.5–35.7)
MCV: 92 fL (ref 79–97)
Platelets: 160 10*3/uL (ref 150–450)
RBC: 4.49 x10E6/uL (ref 4.14–5.80)
RDW: 12.8 % (ref 11.6–15.4)
WBC: 9 10*3/uL (ref 3.4–10.8)

## 2022-10-19 LAB — LIPID PANEL
Cholesterol / HDL Ratio: 2.6 ratio (ref 0.0–5.0)
Cholesterol: 122 mg/dL (ref 100–199)
HDL: 47 mg/dL (ref >39)
LDL Chol Calculated (NIH): 56 mg/dL (ref 0–99)
Triglycerides: 105 mg/dL (ref 0–149)
VLDL Calculated: 19 mg/dL (ref 5–40)

## 2022-10-19 LAB — HEMOGLOBIN A1C: Hemoglobin A1C: 6.3 % — ABNORMAL HIGH (ref 4.8–5.6)

## 2022-10-20 ENCOUNTER — Encounter (INDEPENDENT_AMBULATORY_CARE_PROVIDER_SITE_OTHER): Payer: Self-pay

## 2022-11-06 ENCOUNTER — Ambulatory Visit (INDEPENDENT_AMBULATORY_CARE_PROVIDER_SITE_OTHER): Payer: No Typology Code available for payment source

## 2022-11-13 ENCOUNTER — Telehealth (INDEPENDENT_AMBULATORY_CARE_PROVIDER_SITE_OTHER): Payer: Self-pay

## 2022-11-13 NOTE — Telephone Encounter (Signed)
[id number] - [place], [place] - [address]  States the the Pt needs a script for the acuccheck viva plus to match the strips that were ordered. The pharmacist states that would probably be the cheaper option for the Pt as opposed to ordering new strips (they can't be returned). Please advise and f/u.

## 2022-11-16 MED ORDER — ACCU-CHEK AVIVA PLUS W/DEVICE KIT
PACK | 1 refills | Status: DC
Start: 2022-11-16 — End: 2024-05-09

## 2022-11-16 NOTE — Telephone Encounter (Signed)
Aviva acuccheck has been prescribed and sent to the patients preferred pharmacy.

## 2022-12-04 ENCOUNTER — Telehealth (INDEPENDENT_AMBULATORY_CARE_PROVIDER_SITE_OTHER): Payer: Self-pay

## 2022-12-04 ENCOUNTER — Ambulatory Visit (INDEPENDENT_AMBULATORY_CARE_PROVIDER_SITE_OTHER): Payer: No Typology Code available for payment source

## 2022-12-04 ENCOUNTER — Encounter (INDEPENDENT_AMBULATORY_CARE_PROVIDER_SITE_OTHER): Payer: Self-pay

## 2022-12-04 VITALS — BP 118/78 | HR 73 | Temp 97.3°F

## 2022-12-04 DIAGNOSIS — M79661 Pain in right lower leg: Secondary | ICD-10-CM

## 2022-12-04 DIAGNOSIS — I10 Essential (primary) hypertension: Secondary | ICD-10-CM

## 2022-12-04 DIAGNOSIS — R600 Localized edema: Secondary | ICD-10-CM

## 2022-12-04 DIAGNOSIS — I872 Venous insufficiency (chronic) (peripheral): Secondary | ICD-10-CM

## 2022-12-04 MED ORDER — FUROSEMIDE 20 MG PO TABS
20.0000 mg | ORAL_TABLET | Freq: Two times a day (BID) | ORAL | 0 refills | Status: DC
Start: 2022-12-04 — End: 2022-12-07

## 2022-12-04 MED ORDER — POTASSIUM CHLORIDE 20 MEQ PO PACK
20.0000 meq | PACK | Freq: Every day | ORAL | 0 refills | Status: AC
Start: 2022-12-04 — End: 2022-12-09

## 2022-12-04 MED ORDER — TRIAMCINOLONE ACETONIDE 0.1 % EX CREA
TOPICAL_CREAM | Freq: Two times a day (BID) | CUTANEOUS | 0 refills | Status: DC
Start: 2022-12-04 — End: 2023-04-23

## 2022-12-04 MED ORDER — TRAMADOL HCL 50 MG PO TABS
50.0000 mg | ORAL_TABLET | Freq: Four times a day (QID) | ORAL | 0 refills | Status: AC | PRN
Start: 2022-12-04 — End: 2022-12-09

## 2022-12-04 NOTE — Progress Notes (Signed)
[name]                       Date of Exam: 12/04/2022 12:59 PM        Patient ID: Brian Pena is a 77 y.o. male.  Attending Physician: Alesia Banda, FNP        Chief Complaint:    Chief Complaint   Patient presents with    Leg Swelling     Onset: a week ago  Getting worse, swelling in both legs, extreme pain in R leg, feet swelling and pain, 9/10 pain scale  Was told amlodipine can cause swelling. Want to discuss possibly switching medication               HPI:    76 year old male presenting with bilateral LE edema     PMH significant for a fib, HTN, remote hx of CVA, T2DM,  hyperlipidemia and peripheral neuropathy   He is on Eliquis 2.5 mg BID- paroxsymal a fib   Saw Cardiology in Feb 2023, followed by [facility]. Amlodipine was decreased to 2.5 mg daily due to LE swelling. But he states it was mild at that time and equal bilaterally.   He was taking Amlodipine 2.5mg  but swelling was worsening and BP readings were labile so he increased his dosage back to 5 mg daily and BP control has improved.     1 week ago, started developing worsening edema of the right LE   Right lower leg is now erythematous, skin is taught   Noticed his sock was damp, his right leg is weeping fluid   Hard to walk due to the pain, he is in a wheelchair at today's visit   Does note that he has significant issues with his right knee, always swollen, has 70-90 cc of fluid drawn off his right knee every 3 months.     States a similar thing happened to him in 2022, negative for DVT and was treated with lasix   He denies chest pain, dyspnea, SOB or pleuritic chest pain   He denies fever or flu like symptoms   No hx of DVT   He has been taking ibuprofen for the pain. Tried a Vicodin that he had at home, noted no improvement in his pain.     Pt states he has been on 3 different antibiotic courses. Of note, recently finished course of amox and flagyl 3 days ago for a dental infection.                Problem List:    Patient Active Problem List   Diagnosis    Transient global amnesia    Cardiomyopathy    Type 2 diabetes mellitus with diabetic polyneuropathy, without long-term current use of insulin    Lumbar stenosis with neurogenic claudication    Neuropathy    Paroxysmal atrial fibrillation    Anemia    Basal cell carcinoma of skin    Benign prostatic hyperplasia with urinary obstruction    Essential hypertension    History of cerebrovascular accident    Hyperlipidemia    Low back pain    Numbness of hand    Peripheral vascular disease    Encounter for preventive health examination    Epidermoid cyst    Urge incontinence    History of smoking    Current moderate episode of major depressive disorder without prior episode  Current Meds:    Outpatient Medications Marked as Taking for the 12/04/22 encounter (Office Visit) with Alesia Banda, FNP   Medication Sig Dispense Refill    acetaminophen (TYLENOL) 500 MG tablet Take 1 tablet (500 mg) by mouth every 6 (six) hours as needed for Pain      amLODIPine (NORVASC) 5 MG tablet Take 0.5 tablets (2.5 mg) by mouth daily 90 tablet 1    Blood Glucose Monitoring Suppl (Accu-Chek Aviva Plus) w/Device Kit Use as Directed 1 kit 1    CALCIUM PO Take 1,000 mg by mouth daily      Eliquis 2.5 MG TAKE 1 TABLET(2.5 MG) BY MOUTH EVERY 12 HOURS 180 tablet 0    glimepiride (AMARYL) 2 MG tablet TAKE 1 TABLET(2 MG) BY MOUTH EVERY MORNING 90 tablet 1    glucose blood test strip Accu-Chek Aviva Plus test strips 100 each 3    HYDROcodone-acetaminophen (NORCO) 5-325 MG per tablet TAKE 1 TABLET BY MOUTH 1 TO 2 TIMES DAILY FOR SEVERE PAIN      Lancets (accu-chek multiclix) lancets Accu-Chek Softclix Lancets      lisinopril (ZESTRIL) 20 MG tablet TAKE 1 TABLET BY MOUTH EVERY DAY 90 tablet 1    metFORMIN (GLUCOPHAGE) 1000 MG tablet TAKE 1 TABLET BY MOUTH TWICE DAILY 180 tablet 1    metoprolol succinate XL (TOPROL-XL) 25 MG 24 hr tablet Take 1 tablet (25 mg) by mouth daily 90  tablet 3    raNITIdine HCl (RANITIDINE 75 PO) Take 150 mg by mouth daily      rosuvastatin (CRESTOR) 5 MG tablet TAKE 1 TABLET BY MOUTH EVERY DAY 90 tablet 1    tamsulosin (FLOMAX) 0.4 MG Cap Take 1 capsule (0.4 mg) by mouth 2 (two) times daily      traZODone (DESYREL) 50 MG tablet TAKE 1 TABLET(50 MG) BY MOUTH EVERY NIGHT 90 tablet 1    [DISCONTINUED] traMADol (ULTRAM) 50 MG tablet             Allergies:    Allergies   Allergen Reactions    Linezolid Nausea Only, Other (See Comments) and Anaphylaxis     weakness  Decreased appetite, nausea, fatigue.  Decreased appetite, nausea, fatigue.               Past Surgical History:    Past Surgical History:   Procedure Laterality Date    BACK SURGERY  2017    scoliosis and stenosis - 3 surgeries total    CERVICAL SPINE SURGERY  2019    KNEE SURGERY Left 10/08/1978    medial meniscectomy           Family History:    Family History   Problem Relation Age of Onset    Diabetes Mother     Heart disease Father     Stroke Father     Diabetes Father            Social History:    Social History     Tobacco Use    Smoking status: Former     Current packs/day: 0.00     Average packs/day: 0.5 packs/day for 50.0 years (25.0 ttl pk-yrs)     Types: Cigarettes     Start date: 1964     Quit date: 2014     Years since quitting: 10.2    Smokeless tobacco: Never   Vaping Use    Vaping status: Never Used   Substance Use Topics    Alcohol use: Not  Currently     Alcohol/week: 0.0 standard drinks of alcohol    Drug use: Never           The following sections were reviewed this encounter by the provider:   Tobacco  Allergies  Meds  Problems  Med Hx  Surg Hx  Fam Hx             Vital Signs:    BP 118/78 (BP Site: Left arm, Patient Position: Sitting, Cuff Size: Medium)   Pulse 73   Temp 97.3 F (36.3 C) (Tympanic)   SpO2 97%          ROS:    As per HPI          Physical Exam:    Physical Exam  Vitals and nursing note reviewed.   Constitutional:       General: He is not in acute distress.      Appearance: Normal appearance. He is not ill-appearing, toxic-appearing or diaphoretic.   HENT:      Head: Normocephalic and atraumatic.   Cardiovascular:      Rate and Rhythm: Normal rate and regular rhythm.      Heart sounds: Normal heart sounds.   Pulmonary:      Effort: Pulmonary effort is normal. No respiratory distress.      Breath sounds: Normal breath sounds. No stridor. No wheezing or rhonchi.   Musculoskeletal:      Right knee: Swelling (baseline) and effusion (baseline) present.      Right lower leg: Edema (3+) present.      Left lower leg: Edema (1+) present.      Comments: Right lower extremity and foot is diffusely erythematous, tender and rather edematous. He is able to dorsiflex his right foot w/ mild pain.    Skin:     General: Skin is warm and dry.   Neurological:      Mental Status: He is alert and oriented to person, place, and time.   Psychiatric:         Mood and Affect: Mood normal.         Behavior: Behavior normal.              Assessment:    1. Unilateral edema of lower extremity  - US Venous Low Extrem Duplx Dopp Uni Right  - furosemide (LASIX) 20 MG tablet; Take 1 tablet (20 mg) by mouth 2 (two) times daily for 5 days  Dispense: 10 tablet; Refill: 0  - potassium chloride (KLOR-CON) 20 MEQ packet; Take 20 mEq by mouth daily for 5 days  Dispense: 5 packet; Refill: 0    2. Pain in right lower leg  - US Venous Low Extrem Duplx Dopp Uni Right  - traMADol (ULTRAM) 50 MG tablet; Take 1 tablet (50 mg) by mouth every 6 (six) hours as needed for Pain  Dispense: 20 tablet; Refill: 0    3. 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 Essential hypertension            Plan:    Reviewed symptoms in detail with patient and his wife   Discussed concerns regarding DVT. Discussed concern for cellulitis.   Doppler US of RLE today - appt scheduled for 1400  today   Will stop amlodipine 5 mg   Will start lasix BID  x 5 days w/ daily K supplementation. K was 4.7 and creatinine was 0.9 on 10/16/22.  Discussed started empiric abx treatment; pt would like to defer given he has recently completed multiple antibiotic courses. He is afebrile and VSS so will hold on antibiotic today. But advised pt to watch very closely for any signs of infection.  Discussed need to closely monitor BP, watch for hypotension   May treat severe pain with tramadol PRN, rx sent, PMP reviewed   May apply triamcinolone cream as directed to affected area   Elevate legs as able  Strict emergency precautions discussed   Follow up in 1 wk, sooner if needed   Pt and his wife verbalized understanding of recommendations and all questions were answered to their satisfaction.           Follow-up:    No follow-ups on file.         Alesia Banda, FNP

## 2022-12-04 NOTE — Telephone Encounter (Signed)
disregard

## 2022-12-04 NOTE — Telephone Encounter (Signed)
Uyen from [facility] called states he had Swelling in calf area, could not see well  From knee up he is okay.  They will send detailed report today

## 2022-12-06 DIAGNOSIS — L03115 Cellulitis of right lower limb: Secondary | ICD-10-CM | POA: Diagnosis present

## 2022-12-07 ENCOUNTER — Telehealth (INDEPENDENT_AMBULATORY_CARE_PROVIDER_SITE_OTHER): Payer: Self-pay

## 2022-12-07 DIAGNOSIS — R600 Localized edema: Secondary | ICD-10-CM

## 2022-12-07 MED ORDER — FUROSEMIDE 20 MG PO TABS
20.0000 mg | ORAL_TABLET | Freq: Two times a day (BID) | ORAL | 0 refills | Status: DC
Start: 2022-12-07 — End: 2023-01-14

## 2022-12-07 NOTE — Telephone Encounter (Signed)
Pt's wife calling back to check status. States pt is out of meds and she would like a call back to know if he will be getting more furosemide. Please assess and c/b.

## 2022-12-07 NOTE — Telephone Encounter (Signed)
Pt's wife calling today to ask NP Annice Needy if pt should still be taking the furosemide and if so could she sent in a refill. States pt was in the ER this weekend as he couldn't walk and he was diagnosed with Cellulitis. Pt is scheduled for a f/u Friday. Please assess and c/b.

## 2022-12-07 NOTE — Telephone Encounter (Signed)
Noted  

## 2022-12-08 NOTE — Telephone Encounter (Signed)
Spoke with pt yesterday. Went to [facility] ER on 3/31, diagnosed with cellulitis, started on Keflex.     Continuing with lasix, feels it is helping a little. Will continue, new rx sent. Has f/u appt on 4/5    Please request records from recent ER visit at [facility]

## 2022-12-09 ENCOUNTER — Telehealth (INDEPENDENT_AMBULATORY_CARE_PROVIDER_SITE_OTHER): Payer: Self-pay

## 2022-12-09 NOTE — Telephone Encounter (Signed)
Patient's wife states that patient saw NP Healey Friday and was seen in [facility] ER on Sunday due to pain and AFIB. Patient was diagnosed with cellulitis and prescribed the Gabapentin from the ER doctor. Patient wife states that the patient has slowed speech, worse when he wakes up in the morning and when he wakes up from a nap as well. Denies dizziness, weakness, numbness, tingling. Wife states that patient has follow up appointment with NP Healey this Friday.     NP Healey - please review situation. Patient and wife wondering whether they should hold off on the Gabapentin until their appointment with you on Friday, or should they continue to take it. Patient takes it 3 times a day. Patient also has meds for tramadol and hydrocodone (prescribed by you). Thank you!

## 2022-12-09 NOTE — Telephone Encounter (Signed)
Pts wife is calling to inquire about gabapentin (NEURONTIN) 300 MG capsule the Pt is os on. Pt is has waking to confusion/ slurred words during afternoon naps, and in the mornings the sxs are a little worse. Pt wife isnt sure if its the meds or something else but she did want to follow up with FNP healey and see what she thinks of the situation. Please advise and f.u

## 2022-12-09 NOTE — Telephone Encounter (Signed)
Spoke with wife. Aware of NP Healey message. Pt wife agrees with plan.

## 2022-12-09 NOTE — Telephone Encounter (Signed)
Definitely could be the gabapentin. Id hold it until our follow up appt if possible. If the slurred speech doesn't improve after holding gabapentin, recommend ER.

## 2022-12-10 ENCOUNTER — Encounter (INDEPENDENT_AMBULATORY_CARE_PROVIDER_SITE_OTHER): Payer: Self-pay

## 2022-12-11 ENCOUNTER — Ambulatory Visit (INDEPENDENT_AMBULATORY_CARE_PROVIDER_SITE_OTHER): Payer: No Typology Code available for payment source

## 2022-12-11 ENCOUNTER — Encounter (INDEPENDENT_AMBULATORY_CARE_PROVIDER_SITE_OTHER): Payer: Self-pay

## 2022-12-11 VITALS — BP 120/70 | HR 89 | Temp 98.5°F | Ht 70.0 in | Wt 160.0 lb

## 2022-12-11 DIAGNOSIS — R600 Localized edema: Secondary | ICD-10-CM

## 2022-12-11 DIAGNOSIS — T148XXA Other injury of unspecified body region, initial encounter: Secondary | ICD-10-CM

## 2022-12-11 DIAGNOSIS — M79661 Pain in right lower leg: Secondary | ICD-10-CM

## 2022-12-11 NOTE — Progress Notes (Unsigned)
[facility] [facility] - [facility] [place] [facility]                       Date of Exam: 12/11/2022 7:59 AM        Patient ID: Brian Pena is a 77 y.o. male.  Attending Physician: Brian Pena        Chief Complaint:    Chief Complaint   Patient presents with    Leg Swelling     Bilateral leg swelling below knee worse in right leg since past two weeks, pain 9 on scale of 10  Pt was seen at [facility] yesterday for severe pain in legs, had labs done per pt were normal and was given abs for any possible inflammation                HPI:    77 year old male presenting for follow up on his leg swelling     Seen 1 wk ago with right LE swelling, redness and pain   Was given rx for lasix and tramadol for pain   US doppler of right LE showed no DVTs, but exam was limited due to swelling, possible hematoma seen     He was seen in the [facility] ER on 3/31 with worsening pain and swelling - we do not have the records   He was started on Keflex for potential cellulitis and given rx for gabapentin  Gabapentin was causing slurred speech, they have been holding this med and this has since resolved   States lab work was also drawn at ER and was unremarkable     Since then, he hasn't noted any significant improvement in pain or swelling of right lower extremity  Still cannot bear weight  He is in a wheelchair at today's visit  No chest pain or SOB   No fevers      US Venous Low Extrem Duplx Dopp Uni Right  Narrative: [facility]  [facility]    Brian Pena, Brian Pena                              EXAM PERFORMED AT:  [id number]       DOB:February 08, 1946                 [address]  12/04/2022     AGE:74   Gender:M              Physicians Only: [phone]         Brian Roskos NP                                    [F]       [address]       [place], [place] [address]    ULTRASOUND RIGHT LEG VENOUS DOPPLER, DVT PROTOCOL    HISTORY: Right lower shotty pain and edema. Patient on  intravascular  coagulation.    COMPARISON: None.    TECHNIQUE: Wallace Cullens scale, color flow, and spectral Doppler waveform analysis  was performed on the veins described below. There is normal  compressibility, phasic flow, and response to augmentation unless otherwise  noted.    FINDINGS:     Right lower extremity:    Common femoral vein: Normal  Greater saphenous vein at the saphenofemoral junction: Normal  Deep femoral vein (proximal portion): Normal  Femoral vein: Normal  Popliteal vein: Normal  Posterior tibial veins: Not well visualized  Peroneal veins: Not well visualized    Heterogeneous collection along the anteromedial calf measures 8.1 x 2.1 x  6.6 cm and may represent a hematoma.    The left common femoral vein (imaged per protocol) is patent.  Impression:    No sonographic evidence for right lower extremity deep venous thrombosis.  Probable right calf hematoma as described above.    Findings were communicated with Gardiner Rhyme, RN of the referring provider's  office at 3:30 PM on 12/04/2022.    Electronically signed by: Philipp Deputy M.D.  [facility]    AD: 12/04/22        Last Visit:     Date of Exam: 12/04/2022 12:59 PM        Patient ID: Brian Pena is a 77 y.o. male.  Attending Physician: Brian Pena        Chief Complaint:    Chief Complaint   Patient presents with    Leg Swelling     Onset: a week ago  Getting worse, swelling in both legs, extreme pain in R leg, feet swelling and pain, 9/10 pain scale  Was told amlodipine can cause swelling. Want to discuss possibly switching medication               HPI:    77 year old male presenting with bilateral LE edema     PMH significant for a fib, HTN, remote hx of CVA, T2DM,  hyperlipidemia and peripheral neuropathy   He is on Eliquis 2.5 mg BID- paroxsymal a fib   Saw Cardiology in Feb 2023, followed by [facility]. Amlodipine was decreased to 2.5 mg daily due to LE swelling. But he states it was mild at that time and  equal bilaterally.   He was taking Amlodipine 2.5mg  but swelling was worsening and BP readings were labile so he increased his dosage back to 5 mg daily and BP control has improved.     1 week ago, started developing worsening edema of the right LE   Right lower leg is now erythematous, skin is taught   Noticed his sock was damp, his right leg is weeping fluid   Hard to walk due to the pain, he is in a wheelchair at today's visit   Does note that he has significant issues with his right knee, always swollen, has 70-90 cc of fluid drawn off his right knee every 3 months.     States a similar thing happened to him in 2022, negative for DVT and was treated with lasix   He denies chest pain, dyspnea, SOB or pleuritic chest pain   He denies fever or flu like symptoms   No hx of DVT   He has been taking ibuprofen for the pain. Tried a Vicodin that he had at home, noted no improvement in his pain.     Pt states he has been on 3 different antibiotic courses. Of note, recently finished course of amox and flagyl 3 days ago for a dental infection.                  Problem List:    Patient Active Problem List   Diagnosis    Transient global amnesia    Cardiomyopathy    Type 2 diabetes mellitus with diabetic polyneuropathy, without long-term current use of insulin    Lumbar  stenosis with neurogenic claudication    Neuropathy    Paroxysmal atrial fibrillation    Anemia    Basal cell carcinoma of skin    Benign prostatic hyperplasia with urinary obstruction    Essential hypertension    History of cerebrovascular accident    Hyperlipidemia    Low back pain    Numbness of hand    Peripheral vascular disease    Encounter for preventive health examination    Epidermoid cyst    Urge incontinence    History of smoking    Current moderate episode of major depressive disorder without prior episode             Current Meds:    Outpatient Medications Marked as Taking for the 12/11/22 encounter (Office Visit) with Brian Pena    Medication Sig Dispense Refill    acetaminophen (TYLENOL) 500 MG tablet Take 1 tablet (500 mg) by mouth every 6 (six) hours as needed for Pain      amLODIPine (NORVASC) 5 MG tablet Take 0.5 tablets (2.5 mg) by mouth daily 90 tablet 1    Blood Glucose Monitoring Suppl (Accu-Chek Aviva Plus) w/Device Kit Use as Directed 1 kit 1    CALCIUM PO Take 1,000 mg by mouth daily      cephALEXin (KEFLEX) 500 MG capsule TAKE 1 CAPSULE BY MOUTH EVERY 6 HOURS FOR CELLULITIS      Eliquis 2.5 MG TAKE 1 TABLET(2.5 MG) BY MOUTH EVERY 12 HOURS 180 tablet 0    furosemide (LASIX) 20 MG tablet Take 1 tablet (20 mg) by mouth 2 (two) times daily 14 tablet 0    gabapentin (NEURONTIN) 300 MG capsule Take 1 capsule (300 mg) by mouth 3 (three) times daily      glimepiride (AMARYL) 2 MG tablet TAKE 1 TABLET(2 MG) BY MOUTH EVERY MORNING 90 tablet 1    glucose blood test strip Accu-Chek Aviva Plus test strips 100 each 3    HYDROcodone-acetaminophen (NORCO) 5-325 MG per tablet TAKE 1 TABLET BY MOUTH 1 TO 2 TIMES DAILY FOR SEVERE PAIN      Lancets (accu-chek multiclix) lancets Accu-Chek Softclix Lancets      lisinopril (ZESTRIL) 20 MG tablet TAKE 1 TABLET BY MOUTH EVERY DAY 90 tablet 1    metFORMIN (GLUCOPHAGE) 1000 MG tablet TAKE 1 TABLET BY MOUTH TWICE DAILY 180 tablet 1    metoprolol succinate XL (TOPROL-XL) 25 MG 24 hr tablet Take 1 tablet (25 mg) by mouth daily 90 tablet 3    oxyCODONE-acetaminophen (PERCOCET) 5-325 MG per tablet Take 1 tablet by mouth every 8 (eight) hours as needed for Pain      raNITIdine HCl (RANITIDINE 75 PO) Take 150 mg by mouth daily      rosuvastatin (CRESTOR) 5 MG tablet TAKE 1 TABLET BY MOUTH EVERY DAY 90 tablet 1    tamsulosin (FLOMAX) 0.4 MG Cap Take 1 capsule (0.4 mg) by mouth 2 (two) times daily      traZODone (DESYREL) 50 MG tablet TAKE 1 TABLET(50 MG) BY MOUTH EVERY NIGHT 90 tablet 1    triamcinolone (KENALOG) 0.1 % cream Apply topically 2 (two) times daily Apply to affected area twice daily 45 g 0           Allergies:    Allergies   Allergen Reactions    Linezolid Nausea Only, Other (See Comments) and Anaphylaxis     weakness  Decreased appetite, nausea, fatigue.  Decreased appetite, nausea, fatigue.  Past Surgical History:    Past Surgical History:   Procedure Laterality Date    BACK SURGERY  2017    scoliosis and stenosis - 3 surgeries total    CERVICAL SPINE SURGERY  2019    KNEE SURGERY Left 10/08/1978    medial meniscectomy           Family History:    Family History   Problem Relation Age of Onset    Diabetes Mother     Heart disease Father     Stroke Father     Diabetes Father            Social History:    Social History     Tobacco Use    Smoking status: Former     Current packs/day: 0.00     Average packs/day: 0.5 packs/day for 50.0 years (25.0 ttl pk-yrs)     Types: Cigarettes     Start date: 1964     Quit date: 2014     Years since quitting: 10.2    Smokeless tobacco: Never   Vaping Use    Vaping status: Never Used   Substance Use Topics    Alcohol use: Not Currently     Alcohol/week: 0.0 standard drinks of alcohol    Drug use: Never           The following sections were reviewed this encounter by the provider:   Tobacco  Allergies  Meds  Problems  Med Hx  Surg Hx  Fam Hx             Vital Signs:    BP 120/70 (BP Site: Left arm, Patient Position: Sitting, Cuff Size: Medium)   Pulse 89   Temp 98.5 F (36.9 C) (Tympanic)   Ht 1.778 m (5\' 10" )   Wt 72.6 kg (160 lb) Comment: pt reported  SpO2 100%   BMI 22.96 kg/m          ROS:    As per HPI        Physical Exam:    Physical Exam  Vitals and nursing note reviewed.   Constitutional:       General: He is not in acute distress.     Appearance: Normal appearance. He is not ill-appearing, toxic-appearing or diaphoretic.   HENT:      Head: Normocephalic.   Cardiovascular:      Rate and Rhythm: Normal rate.   Pulmonary:      Effort: Pulmonary effort is normal. No respiratory distress.   Musculoskeletal:      Right lower leg: Edema (continued  pitting edema, erythema and significant tenderness with palpation of R LE.) present.   Skin:     General: Skin is warm and dry.   Neurological:      Mental Status: He is alert and oriented to person, place, and time.   Psychiatric:         Mood and Affect: Mood normal.         Behavior: Behavior normal.              Assessment:    1. Pain in right lower leg    2. Unilateral edema of lower extremity    3. Hematoma            Plan:    Reviewed concerns with patient and wife   Lasix has not helped, I do not think this is a venous issue given his doppler US results and lack of response  to Lasix.   Concern regarding possible hematoma on [place]   Discussed need for immediate further evaluation  Recommend he be taken to the ER for evaluation and further imaging  Pt and wife verbalize understanding, wife will take pt to [facility] ER now   Report called to [facility] ER charge RN             Follow-up:    No follow-ups on file.         Brian Pena

## 2022-12-22 ENCOUNTER — Other Ambulatory Visit (INDEPENDENT_AMBULATORY_CARE_PROVIDER_SITE_OTHER): Payer: Self-pay | Admitting: Cardiovascular Disease

## 2022-12-22 DIAGNOSIS — I10 Essential (primary) hypertension: Secondary | ICD-10-CM

## 2022-12-31 ENCOUNTER — Other Ambulatory Visit (INDEPENDENT_AMBULATORY_CARE_PROVIDER_SITE_OTHER): Payer: Self-pay | Admitting: Family Medicine

## 2022-12-31 ENCOUNTER — Other Ambulatory Visit (INDEPENDENT_AMBULATORY_CARE_PROVIDER_SITE_OTHER): Payer: Self-pay

## 2022-12-31 DIAGNOSIS — I480 Paroxysmal atrial fibrillation: Secondary | ICD-10-CM

## 2022-12-31 DIAGNOSIS — F5101 Primary insomnia: Secondary | ICD-10-CM

## 2023-01-01 ENCOUNTER — Telehealth (INDEPENDENT_AMBULATORY_CARE_PROVIDER_SITE_OTHER): Payer: Self-pay

## 2023-01-01 NOTE — Telephone Encounter (Signed)
Faxed the following documents to [facility] on fax # [phone] (DATE/TIME: 01/01/2023 at 4:20):    WHAT WAS FAXED:  POC    Confirmation Sheet - approved on 01/01/2023 at 4:34 pm    Placed confirmation sheet in the scan bin on Team b side.

## 2023-01-01 NOTE — Telephone Encounter (Signed)
Faxed the following documents to [facility] on fax # [phone] (DATE/TIME: 01/01/2023 at 1610):    WHAT WAS FAXED:  [facility] ordedr    Confirmation Sheet - approved on 01/01/2023 at 1618     Placed confirmation sheet in the scan bin on Team b side.

## 2023-01-01 NOTE — Telephone Encounter (Signed)
Faxed the following documents to [facility] on fax # [phone] (DATE/TIME: 01/01/2023 4:15pm):    WHAT WAS FAXED:  poc    Confirmation Sheet - confirmed on 01/01/2023 at 1627 hrs    Placed confirmation sheet in the scan bin on Team b side.

## 2023-01-14 ENCOUNTER — Encounter (INDEPENDENT_AMBULATORY_CARE_PROVIDER_SITE_OTHER): Payer: Self-pay | Admitting: Nurse Practitioner

## 2023-01-14 ENCOUNTER — Ambulatory Visit (INDEPENDENT_AMBULATORY_CARE_PROVIDER_SITE_OTHER): Payer: No Typology Code available for payment source | Admitting: Nurse Practitioner

## 2023-01-14 VITALS — BP 120/72 | HR 78 | Ht 70.0 in | Wt 146.0 lb

## 2023-01-14 DIAGNOSIS — E1142 Type 2 diabetes mellitus with diabetic polyneuropathy: Secondary | ICD-10-CM

## 2023-01-14 DIAGNOSIS — Z01810 Encounter for preprocedural cardiovascular examination: Secondary | ICD-10-CM

## 2023-01-14 DIAGNOSIS — I10 Essential (primary) hypertension: Secondary | ICD-10-CM

## 2023-01-14 DIAGNOSIS — Z8673 Personal history of transient ischemic attack (TIA), and cerebral infarction without residual deficits: Secondary | ICD-10-CM

## 2023-01-14 DIAGNOSIS — E782 Mixed hyperlipidemia: Secondary | ICD-10-CM

## 2023-01-14 DIAGNOSIS — I4819 Other persistent atrial fibrillation: Secondary | ICD-10-CM

## 2023-01-14 LAB — ECG 12-LEAD
Q-T Interval: 398 ms
QRS Duration: 82 ms
QTC Calculation (Bezet): 453 ms
R Axis: -75 degrees
T Axis: 15 degrees
Ventricular Rate: 78 {beats}/min

## 2023-01-14 NOTE — Progress Notes (Addendum)
[facility] PROGRESS NOTE    [facility] OFFICE -CARDIOLOGY  [address]  [place] [address]  Dept: [phone]  Dept Fax: [phone]       Patient Name: Brian Pena    Date of Visit:  Jan 14, 2023  Date of Birth: 08-13-1946  AGE: 77 y.o.  Medical Record #: [id number]  Requesting Physician: Domingo Cocking, FNP      CHIEF COMPLAINT: Pre-op Exam      HISTORY OF PRESENT ILLNESS:    He is a pleasant 77 y.o. male who presents today for preoperative cardiac risk assessment prior to knee replacement surgery.  He has known persistent atrial fibrillation, bradycardia, hypertension, hyperlipidemia and diabetes.  He is also had a prior CVA over 15 years ago with residual left upper extremity weakness and known peripheral neuropathy.  He was last seen 12/31/2021 for evaluation of blood pressure  His metoprolol was lowered to 25 mg and changed to succinate.    He was then seen February 2024 and his amlodipine was lowered to 2.5 mg daily to assist with lower extremity swelling.  Eventually, amlodipine was discontinued altogether.  Blood pressures have been relatively stable.  Today's pressure stable at 120/72.    He developed cellulitis about 3 weeks ago.  This has been treated.  It has improved since stopping his amlodipine and being treated with antibiotics.  He has been in a wheelchair since then.    Prior to using a wheelchair, he used a rollator.  He has no new reported cardiac symptoms.  However his EKG shows some new findings.      PAST MEDICAL HISTORY: He has a past medical history of Anemia, Cerebrovascular accident, Diabetes mellitus, Hyperlipidemia, and Hypertension. He has a past surgical history that includes Knee surgery (Left, 10/08/1978); Back surgery (2017); and Cervical spine surgery (2019).    ALLERGIES:   Allergies   Allergen Reactions    Linezolid Nausea Only, Other (See Comments) and Anaphylaxis     weakness  Decreased appetite,  nausea, fatigue.  Decreased appetite, nausea, fatigue.         MEDICATIONS:   Current Outpatient Medications:     acetaminophen (TYLENOL) 500 MG tablet, Take 1 tablet (500 mg) by mouth every 6 (six) hours as needed for Pain, Disp: , Rfl:     apixaban (Eliquis) 2.5 MG, TAKE 1 TABLET(2.5 MG) BY MOUTH EVERY 12 HOURS, Disp: 180 tablet, Rfl: 1    Blood Glucose Monitoring Suppl (Accu-Chek Aviva Plus) w/Device Kit, Use as Directed, Disp: 1 kit, Rfl: 1    CALCIUM PO, Take 1,000 mg by mouth daily, Disp: , Rfl:     glimepiride (AMARYL) 2 MG tablet, TAKE 1 TABLET(2 MG) BY MOUTH EVERY MORNING, Disp: 90 tablet, Rfl: 1    glucose blood test strip, Accu-Chek Aviva Plus test strips, Disp: 100 each, Rfl: 3    HYDROcodone-acetaminophen (NORCO) 5-325 MG per tablet, TAKE 1 TABLET BY MOUTH 1 TO 2 TIMES DAILY FOR SEVERE PAIN, Disp: , Rfl:     Lancets (accu-chek multiclix) lancets, Accu-Chek Softclix Lancets, Disp: , Rfl:     lisinopril (ZESTRIL) 20 MG tablet, TAKE 1 TABLET BY MOUTH EVERY DAY, Disp: 90 tablet, Rfl: 1    metFORMIN (GLUCOPHAGE) 1000 MG tablet, TAKE 1 TABLET BY MOUTH TWICE DAILY, Disp: 180 tablet, Rfl: 1    metoprolol succinate XL (TOPROL-XL) 25 MG 24 hr tablet, TAKE 1 TABLET(25 MG) BY MOUTH DAILY, Disp: 90 tablet,  Rfl: 3    raNITIdine HCl (RANITIDINE 75 PO), Take 150 mg by mouth daily, Disp: , Rfl:     rosuvastatin (CRESTOR) 5 MG tablet, TAKE 1 TABLET BY MOUTH EVERY DAY, Disp: 90 tablet, Rfl: 1    tamsulosin (FLOMAX) 0.4 MG Cap, Take 1 capsule (0.4 mg) by mouth 2 (two) times daily, Disp: , Rfl:     traZODone (DESYREL) 50 MG tablet, TAKE 1 TABLET(50 MG) BY MOUTH EVERY NIGHT, Disp: 90 tablet, Rfl: 0    triamcinolone (KENALOG) 0.1 % cream, Apply topically 2 (two) times daily Apply to affected area twice daily, Disp: 45 g, Rfl: 0    amLODIPine (NORVASC) 5 MG tablet, Take 0.5 tablets (2.5 mg) by mouth daily (Patient not taking: Reported on 01/14/2023), Disp: 90 tablet, Rfl: 1     FAMILY HISTORY: family history includes Diabetes in  his father and mother; Heart disease in his father; Stroke in his father.    SOCIAL HISTORY: He reports that he quit smoking about 10 years ago. His smoking use included cigarettes. He started smoking about 60 years ago. He has a 25 pack-year smoking history. He has never used smokeless tobacco. He reports that he does not currently use alcohol. He reports that he does not use drugs.    PHYSICAL EXAMINATION    Visit Vitals  BP 120/72 (BP Site: Left arm, Patient Position: Sitting, Cuff Size: Medium)   Pulse 78   Ht 1.778 m (5\' 10" )   Wt 66.2 kg (146 lb)   BMI 20.95 kg/m       General Appearance:  A well-appearing male in no acute distress.    CONSTITUTIONAL: Cooperative, alert and oriented, well developed, well nourished, in no acute distress.   SKIN: Warm and dry to touch; no apparent lesions  HEAD: Normocephalic   EYES: Conjunctivae and lids normal   ENT: No pallor or cyanosis   NECK: JVP normal  CHEST: Clear to auscultation bilaterally   CARDIAC: Irregular rhythm  EXTREMITIES AND BACK: No deformities, clubbing, cyanosis, erythema, trace lower extremity edema observed  PSYCHIATRIC: Appropriate affect; Normal memory   NEUROLOGICAL: No gross motor or sensory deficits noted, oriented to time, person and place.     ECG: Atrial fibrillation left axis deviation            LABS:   No results found for: "CBC"  Lab Results   Component Value Date    AST 12 10/16/2022    ALT 14 10/16/2022     No results found for: "LIPID"  Lab Results   Component Value Date    HGBA1C 6.3 (H) 10/16/2022    TSH 1.480 11/28/2021         Most recent echo and nuclear study reviewed.      IMPRESSION:   Brian Pena is a 77 y.o. male with the following problems:    Seen for preoperative cardiac risk assessment    Atrial fibrillation - likely persistent; on lower dosing of Eliquis due to a history of hematuria  History of profound sinus bradycardia  Echocardiogram 10/2020: LVEF 60-65%, mild LVH, no significant valvular  disease  Hypertension  Hyperlipidemia - LDL 48 in March 2023  Diabetes - on Glimepiride, A1c 6.3 in March 2023  Orthopedic issues  Remote tobacco use  Prior CVA 15+ years ago, residual LUE weakness  Peripheral neuropathy  Limited ability to ambulate due to multiple back surgeries - uses a Rollator  Lower extremity edema      RECOMMENDATIONS:  Discussed the patient, history, and today's assessment with Dr. Era Skeen who was available in office today.     Given patient's underlying cardiac risk factors and currently in ability to obtain greater than 4 METS, we will schedule him for an ischemic evaluation with a Lexiscan myocardial perfusion study.  He is unable to walk on a treadmill.    If the study is normal and he may proceed with surgery.    Eliquis can be held 2 days prior to the procedure.  We asked that it be resumed once he is reached hemostasis.  He does have elevated CHADS2 Vascor and is at higher risk of stroke.      Any medications held including anticoagulation and antiplatelets should be resumed as soon as possible after procedure, ideally postoperative day #1. Otherwise continue the remainder of the cardiac regimen perioperatively including the morning of with a small sip of water.    Addendum 02/23/2023: Lexiscan MPI study demonstrated small scar in the apex of the heart.  No significant ischemia.  Patient reviewed with Dr. Anson Fret.  He is an acceptable risk to pursue his knee replacement surgery.                                                   Orders Placed This Encounter   Procedures    ECG 12 lead (Normal)       No orders of the defined types were placed in this encounter.      SIGNED:    Lorin Picket, NP          This note was generated by the Dragon speech recognition and may contain errors or omissions not intended by the user. Grammatical errors, random word insertions, deletions, pronoun errors, and incomplete sentences are occasional consequences of this technology due to  software limitations. Not all errors are caught or corrected. If there are questions or concerns about the content of this note or information contained within the body of this dictation, they should be addressed directly with the author for clarification.

## 2023-02-02 NOTE — Telephone Encounter (Signed)
SWP's wife re: surgical technique.

## 2023-02-03 ENCOUNTER — Inpatient Hospital Stay
Admission: RE | Admit: 2023-02-03 | Discharge: 2023-02-03 | Disposition: A | Discharge status: Home / Self Care | Payer: No Typology Code available for payment source | Source: Ambulatory Visit | Attending: Nurse Practitioner | Admitting: Nurse Practitioner

## 2023-02-03 DIAGNOSIS — I5189 Other ill-defined heart diseases: Secondary | ICD-10-CM | POA: Insufficient documentation

## 2023-02-03 DIAGNOSIS — E1142 Type 2 diabetes mellitus with diabetic polyneuropathy: Secondary | ICD-10-CM

## 2023-02-03 DIAGNOSIS — E782 Mixed hyperlipidemia: Secondary | ICD-10-CM | POA: Insufficient documentation

## 2023-02-03 DIAGNOSIS — I4819 Other persistent atrial fibrillation: Secondary | ICD-10-CM

## 2023-02-03 DIAGNOSIS — Z01810 Encounter for preprocedural cardiovascular examination: Secondary | ICD-10-CM | POA: Insufficient documentation

## 2023-02-03 DIAGNOSIS — I10 Essential (primary) hypertension: Secondary | ICD-10-CM | POA: Insufficient documentation

## 2023-02-03 DIAGNOSIS — Z8673 Personal history of transient ischemic attack (TIA), and cerebral infarction without residual deficits: Secondary | ICD-10-CM | POA: Insufficient documentation

## 2023-02-03 MED ORDER — TECHNETIUM TC 99M TETROFOSMIN IV KIT
35.0000 | PACK | Freq: Once | INTRAVENOUS | Status: AC | PRN
Start: 2023-02-03 — End: 2023-02-03
  Administered 2023-02-03: 35 via INTRAVENOUS
  Filled 2023-02-03: qty 100

## 2023-02-03 MED ORDER — REGADENOSON 0.4 MG/5ML IV SOLN
0.4000 mg | Freq: Once | INTRAVENOUS | Status: AC | PRN
Start: 2023-02-03 — End: 2023-02-03
  Administered 2023-02-03: 0.4 mg via INTRAVENOUS
  Filled 2023-02-03: qty 5

## 2023-02-03 MED ORDER — TECHNETIUM TC 99M TETROFOSMIN IV KIT
10.0000 | PACK | Freq: Once | INTRAVENOUS | Status: AC | PRN
Start: 2023-02-03 — End: 2023-02-03
  Administered 2023-02-03: 10 via INTRAVENOUS
  Filled 2023-02-03: qty 100

## 2023-02-04 ENCOUNTER — Encounter (INDEPENDENT_AMBULATORY_CARE_PROVIDER_SITE_OTHER): Payer: Self-pay | Admitting: Internal Medicine

## 2023-02-04 LAB — NM MYOCARDIAL PERFUSION SPECT W STRESS AND REST: Stress LV Ejection Fraction: 0

## 2023-02-19 ENCOUNTER — Encounter (INDEPENDENT_AMBULATORY_CARE_PROVIDER_SITE_OTHER): Payer: Self-pay

## 2023-02-19 ENCOUNTER — Ambulatory Visit (INDEPENDENT_AMBULATORY_CARE_PROVIDER_SITE_OTHER): Payer: No Typology Code available for payment source

## 2023-02-19 VITALS — BP 112/62 | HR 78 | Temp 97.6°F | Wt 140.0 lb

## 2023-02-19 DIAGNOSIS — M5030 Other cervical disc degeneration, unspecified cervical region: Secondary | ICD-10-CM

## 2023-02-19 DIAGNOSIS — M1711 Unilateral primary osteoarthritis, right knee: Secondary | ICD-10-CM

## 2023-02-19 DIAGNOSIS — R351 Nocturia: Secondary | ICD-10-CM | POA: Insufficient documentation

## 2023-02-19 DIAGNOSIS — I890 Lymphedema, not elsewhere classified: Secondary | ICD-10-CM | POA: Insufficient documentation

## 2023-02-19 DIAGNOSIS — G8929 Other chronic pain: Secondary | ICD-10-CM

## 2023-02-19 DIAGNOSIS — M25561 Pain in right knee: Secondary | ICD-10-CM

## 2023-02-19 DIAGNOSIS — I872 Venous insufficiency (chronic) (peripheral): Secondary | ICD-10-CM | POA: Insufficient documentation

## 2023-02-19 DIAGNOSIS — E1142 Type 2 diabetes mellitus with diabetic polyneuropathy: Secondary | ICD-10-CM

## 2023-02-19 DIAGNOSIS — M170 Bilateral primary osteoarthritis of knee: Secondary | ICD-10-CM

## 2023-02-19 DIAGNOSIS — Z01818 Encounter for other preprocedural examination: Secondary | ICD-10-CM

## 2023-02-19 DIAGNOSIS — I480 Paroxysmal atrial fibrillation: Secondary | ICD-10-CM

## 2023-02-19 DIAGNOSIS — R600 Localized edema: Secondary | ICD-10-CM | POA: Insufficient documentation

## 2023-02-19 DIAGNOSIS — R350 Frequency of micturition: Secondary | ICD-10-CM | POA: Insufficient documentation

## 2023-02-19 DIAGNOSIS — I10 Essential (primary) hypertension: Secondary | ICD-10-CM

## 2023-02-19 DIAGNOSIS — R0989 Other specified symptoms and signs involving the circulatory and respiratory systems: Secondary | ICD-10-CM | POA: Insufficient documentation

## 2023-02-19 HISTORY — DX: Other cervical disc degeneration, unspecified cervical region: M50.30

## 2023-02-19 HISTORY — DX: Lymphedema, not elsewhere classified: I89.0

## 2023-02-19 HISTORY — DX: Venous insufficiency (chronic) (peripheral): I87.2

## 2023-02-19 HISTORY — DX: Bilateral primary osteoarthritis of knee: M17.0

## 2023-02-19 NOTE — Progress Notes (Signed)
[facility] [place] [facility]                       Date of Exam: 02/19/2023 9:08 AM        Patient ID: Brian Pena is a 77 y.o. male.  Attending Physician: Domingo Cocking, FNP        Chief Complaint:    Chief Complaint   Patient presents with    Pre-op Exam     Surgery: Right total knee arthroplasty   Date of surgery: 03/03/23  Fax # [phone]               HPI:    77 year old male presenting for pre op exam     He is scheduled for a right TKA on 03/03/23 with Dr Kara Pacer  Unable to bear weight on right leg   Taking Norco at night for pain control      He has seen [facility] for pre op evaluation   He has completed a pre op nuclear stress test, which showed a small sized area of decreased activity in the apical wall that is mild in intensity    Atrial fibrillation - rate controlled, on eliquis 2.5 mg BID, dose reduction to 2.5 mg BID was d/t issues with hematuria.   HTN - on metoprolol XL 25 mg daily and lisinopril 20 mg daily. Previously on amlodipine but discontinued d/t LE edema. BP has remained well controlled w/o amlodipine.   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, residual LUE weakness          Visit Type: Pre-operative Evaluation  Procedure: right TKA   Date of Surgery: 03/03/23  Surgeon: Kara Pacer  Location of Surgery:  [facility]  Fax Number (Required): [phone]  Chief Complaint: seeking pre op clearance   Recent Health (admits): no current complaints  Recent Health (denies): no current complaints  Exercise Tolerance: < 4 mets, hence nuclear stress testing was performed by cardiology  Surgical Risk Factors: hypertension, history of CVA, diabetes at goal, peripheral vascular disease, and atrial fibrillation  Prior Anesthesia: Patient reports no adverse reaction to anesthesia in the past.             Problem List:    Patient Active Problem List   Diagnosis     Transient global amnesia    Cardiomyopathy    Type 2 diabetes mellitus with diabetic polyneuropathy, without long-term current use of insulin    Lumbar stenosis with neurogenic claudication    Neuropathy    Paroxysmal atrial fibrillation    Anemia    Basal cell carcinoma of skin    Benign prostatic hyperplasia with urinary obstruction    Essential hypertension    History of cerebrovascular accident    Hyperlipidemia    Low back pain    Numbness of hand    Peripheral vascular disease    Encounter for preventive health examination    Epidermoid cyst    Urge incontinence    History of smoking    Current moderate episode of major depressive disorder without prior episode    Abnormal pulse    DDD (degenerative disc disease), cervical    Increased frequency of urination    Leg edema    Lymphedema    Nocturia    Osteoarthritis of both knees,  unspecified osteoarthritis type    Venous stasis dermatitis of both lower extremities             Current Meds:    Outpatient Medications Marked as Taking for the 02/19/23 encounter (Office Visit) with Domingo Cocking, FNP   Medication Sig Dispense Refill    acetaminophen (TYLENOL) 500 MG tablet Take 1 tablet (500 mg) by mouth every 6 (six) hours as needed for Pain      apixaban (Eliquis) 2.5 MG TAKE 1 TABLET(2.5 MG) BY MOUTH EVERY 12 HOURS 180 tablet 1    Blood Glucose Monitoring Suppl (Accu-Chek Aviva Plus) w/Device Kit Use as Directed 1 kit 1    CALCIUM PO Take 1,000 mg by mouth daily      glimepiride (AMARYL) 2 MG tablet TAKE 1 TABLET(2 MG) BY MOUTH EVERY MORNING 90 tablet 1    glucose blood test strip Accu-Chek Aviva Plus test strips 100 each 3    HYDROcodone-acetaminophen (NORCO) 5-325 MG per tablet TAKE 1 TABLET BY MOUTH 1 TO 2 TIMES DAILY FOR SEVERE PAIN      Lancets (accu-chek multiclix) lancets Accu-Chek Softclix Lancets      lisinopril (ZESTRIL) 20 MG tablet TAKE 1 TABLET BY MOUTH EVERY DAY 90 tablet 1    metFORMIN (GLUCOPHAGE) 1000 MG tablet TAKE 1 TABLET BY MOUTH TWICE  DAILY 180 tablet 1    metoprolol succinate XL (TOPROL-XL) 25 MG 24 hr tablet TAKE 1 TABLET(25 MG) BY MOUTH DAILY 90 tablet 3    raNITIdine HCl (RANITIDINE 75 PO) Take 150 mg by mouth daily      rosuvastatin (CRESTOR) 5 MG tablet TAKE 1 TABLET BY MOUTH EVERY DAY 90 tablet 1    tamsulosin (FLOMAX) 0.4 MG Cap Take 1 capsule (0.4 mg) by mouth 2 (two) times daily      traZODone (DESYREL) 50 MG tablet TAKE 1 TABLET(50 MG) BY MOUTH EVERY NIGHT 90 tablet 0    triamcinolone (KENALOG) 0.1 % cream Apply topically 2 (two) times daily Apply to affected area twice daily 45 g 0          Allergies:    Allergies   Allergen Reactions    Linezolid Nausea Only, Other (See Comments) and Anaphylaxis     weakness  Decreased appetite, nausea, fatigue.  Decreased appetite, nausea, fatigue.               Past Surgical History:    Past Surgical History:   Procedure Laterality Date    BACK SURGERY  2017    scoliosis and stenosis - 3 surgeries total    CERVICAL SPINE SURGERY  2019    KNEE SURGERY Left 10/08/1978    medial meniscectomy           Family History:    Family History   Problem Relation Age of Onset    Diabetes Mother     Heart disease Father     Stroke Father     Diabetes Father            Social History:    Social History     Tobacco Use    Smoking status: Former     Current packs/day: 0.00     Average packs/day: 0.5 packs/day for 50.0 years (25.0 ttl pk-yrs)     Types: Cigarettes     Start date: 1964     Quit date: 2014     Years since quitting: 10.4    Smokeless tobacco: Never   Vaping Use  Vaping status: Never Used   Substance Use Topics    Alcohol use: Not Currently     Alcohol/week: 0.0 standard drinks of alcohol    Drug use: Never           The following sections were reviewed this encounter by the provider:   Tobacco  Allergies  Meds  Problems  Med Hx  Surg Hx  Fam Hx             Vital Signs:    BP 112/62 (BP Site: Left arm, Patient Position: Sitting, Cuff Size: Medium)   Pulse 78   Temp 97.6 F (36.4 C)  (Tympanic)   Wt 63.5 kg (140 lb) Comment: pt reported  SpO2 99%   BMI 20.09 kg/m          ROS:    Review of Systems   Constitutional:  Negative for chills and fever.   Respiratory:  Negative for chest tightness, shortness of breath and wheezing.    Cardiovascular:  Negative for chest pain.   Gastrointestinal:  Negative for nausea and vomiting.   Musculoskeletal:  Positive for arthralgias (right knee pain, unable to bear weight).              Physical Exam:    Physical Exam  Vitals and nursing note reviewed.   Constitutional:       General: He is not in acute distress.     Appearance: Normal appearance. He is not ill-appearing, toxic-appearing or diaphoretic.   HENT:      Head: Normocephalic.   Cardiovascular:      Rate and Rhythm: Normal rate. Rhythm irregular.      Heart sounds: Normal heart sounds.   Pulmonary:      Effort: Pulmonary effort is normal. No respiratory distress.      Breath sounds: No stridor. No wheezing, rhonchi or rales.   Musculoskeletal:      Right lower leg: No edema.      Left lower leg: No edema.      Comments: Edematous R knee d/t severe osteoarthritic changes    Skin:     General: Skin is warm and dry.   Neurological:      Mental Status: He is alert and oriented to person, place, and time.   Psychiatric:         Mood and Affect: Mood normal.         Behavior: Behavior normal.              Assessment:    1. Pre-op exam  - Culture Presurgical Surveillance Methicillin Susceptible Staphylococcus aureus and Methicillin Resistant Staphylococcus aureus (MSSA + MRSA)    2. Chronic pain of right knee    3. Primary osteoarthritis of right knee    4. Paroxysmal atrial fibrillation    5. Essential hypertension    6. Type 2 diabetes mellitus with diabetic polyneuropathy, without long-term current use of insulin            Plan:    Preoperative Evaluation:  Surgery Specific Risk:  Intermediate (carotid, intraperitoneal, intrathoracic, prostate, major abdominal, major orthopedic, major ENT)  ASA  Classification: Class 3 - Severe systemic disease, limits normal activity.    EKG Findings:   Performed by cardiology  Preoperative Status:  Hypertension is controlled on the current regimen. Diabetes is controlled on the current regimen.  A1C is at goal. Chronic conditions are stable given the complexity of this patient.   Recommendations:  Apixaban: Pt was advised  to hold Apixaban for 2 days prior to surgery (low bleeding risk surgery).    Pt is has no active cardiovascular symptoms.  Pt cleared for surgery with acceptable risk. Pt is at intermediate risk for peri-operative cardiovascular complications during planned intermediate risk surgical procedure.    MRSA nasal swab collected today.  Lab work was completed at [facility] per pt.           Follow-up:    No follow-ups on file.         Domingo Cocking, FNP

## 2023-02-20 ENCOUNTER — Telehealth (INDEPENDENT_AMBULATORY_CARE_PROVIDER_SITE_OTHER): Payer: Self-pay

## 2023-02-20 NOTE — Telephone Encounter (Signed)
Can we please call [facility] and track down Brian Pena's pre op lab results?     Pt took lab work order provided by Dr Kara Pacer to an outside [facility]. Need results for pre op clearance. Thanks!

## 2023-02-21 LAB — REFLEX - MSSA+MRSA

## 2023-02-21 LAB — CULTURE PRESURGICAL SURVEILLANCE METHICILLIN SUSCEPTIBLE STAPHYLOCOCCUS AUREUS AND METHICILLIN RESISTANT STAPHYLOCOCCUS AUREUS (MSSA + MRSA)

## 2023-02-22 NOTE — Telephone Encounter (Signed)
Received left on your door

## 2023-02-23 ENCOUNTER — Telehealth (INDEPENDENT_AMBULATORY_CARE_PROVIDER_SITE_OTHER): Payer: Self-pay

## 2023-02-23 NOTE — Telephone Encounter (Signed)
Toniann Fail from [place] calling in follow up to pt's OV on 01/14/23 as it relates to cardiac clearance for upcoming knee replacement. Per received documentation, Toniann Fail states finalized clearance dependent on stress test completed on 02/04/23. Advised will reach out to provider to inquire about updated guidance as it relates to results of stress test. Contact information for [facility] as provided by Toniann Fail:     Phone: [phone]  Fax: [phone]  Procedure: 03/03/23    Toniann Fail verbalized understanding and has no further needs at this time.

## 2023-03-05 NOTE — Progress Notes (Signed)
Progress Note    Assessment/Plan  # Postop day 2 status post elective right total knee arthroplasty.    # Improving post-op urinary retention  -DVT prophylaxis with resumption of the patient's outpatient apixaban 2.5 mg twice daily  -Pain control  -PT/OT: patient able to ambulate for the first time in months  - CM working on SNF placement      # Persistent atrial fibrillation  - Continue patient's outpatient apixaban postoperatively.  He is on low-dose apparently because of hematuria.  - mildly tachy to 100s today, appears well hydrated    - Metoprolol succinate 25 mg daily at home, has been tachy with rates 100s so may trial increased dose of metoprolol tartrate 25mg  bid, if effective and BP not too low would change to Toprol XL 50mg  qd     # Primary hypertension  -Continue lisinopril  -increase metoprolol as above      # Hypercholesterolemia  -Continue rosuvastatin     # Diabetes mellitus type 2  Sugars 100-166 demonstrating decent control  -Insulin low-dose sliding scale  -hold metformin while inpatient  -Diabetic diet     # BPH  -Continue tamsulosin  -Continue to monitor for urinary retention. Patient is urinating some with postvoid residuals in the 300 range, may not void completely at home.  - overnight and this AM voiding with good output       # Tinea cruris  Patient notices subjective improvement  -Nystatin powder twice daily    # hiccups   - would trial PPI, if not effective may try dose of reglan        Subjective  Reports hiccups for 2 days. No BM since admission. Wants to take a shower/bath. Pain under better control. 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 Denies cp, sob, abd pain, n/v/d.      Objective  Review of Systems as above.     Vitals: BP 137/77 (BP Location: Right arm, Patient Position: Sitting)   Pulse 105   Temp 35.7 C (96.2 F) (Oral)   Resp 19   Ht 1.778 m (5\' 10" )   Wt 63.5 kg (140 lb)   SpO2 97%   BMI 20.09 kg/m     Physical Exam  GENERAL:  elderly male sitting up  in bed, no acute distress; daughter and RN at bedside   HENT:  head atraumatic, no rhinorrhea or epistaxis, MM moist   EYES:  Pupils equal.  Sclerae anicteric.   RESPIRATORY:  Clear to auscultation bilaterally.  Nonlabored breathing. No wheeze, rales, rhonchi   CARDIAC:  RRR. No murmur appreciated.  GI:  Positive bowel sounds throughout.   Soft, non tender, non distended   MUSCULOSKELETAL: no significant edema BLE, incision site c/d/i  NEURO: Patient is A&O, appropriately interactive.  No focal neuro deficit noted. Moves all extremities, no tremor.   PSYCH: Normal affect. Smiles and makes eye contact     I/O:    Intake/Output Summary (Last 24 hours) at 03/05/2023 1304  Last data filed at 03/05/2023 0500  Gross per 24 hour   Intake 780 ml   Output 1250 ml   Net -470 ml       Diagnostic Results:   I have personally reviewed labs below revealing mild hyponatremia, hyperglycemia, stable renal fxn     Lab Results   Component Value Date    GLUCOSE 166 (H) 03/05/2023    CALCIUM 9.0 03/05/2023    NA 133 (L) 03/05/2023  K 4.5 03/05/2023    CO2 22 (L) 03/05/2023    CL 101 03/05/2023    BUN 33 (H) 03/05/2023    CREATININE 0.78 03/05/2023   Estimated Creatinine Clearance: 69 mL/min (by C-G formula based on SCr of 0.78 mg/dL).

## 2023-03-05 NOTE — Discharge Summary (Signed)
Discharge Summary    Admitting Provider: Reyes Ivan, MD  Discharge Provider: Reyes Ivan, MD  Primary Care Physician at Discharge: No PCP, MD    Admission Date: 03/03/2023     Discharge Date: 03/05/2023    Consultation(s):  Internal Medicine     Pertinent Procedures:  Right Total Knee Arthroplasty    Discharge Review of Systems/Physical Exam:    Review of Systems    Vitals: BP 137/77 (BP Location: Right arm, Patient Position: Sitting)   Pulse 99   Temp 36 C (96.8 F) (Axillary)   Resp 19   Ht 1.778 m (5\' 10" )   Wt 63.5 kg (140 lb)   SpO2 97%   BMI 20.09 kg/m     Physical Exam    Lines:                  Discharge Diagnoses:  Principal Problem:    S/P total knee arthroplasty, right  Active Problems:    Persistent atrial fibrillation (CMS/HCC)    Primary hypertension    Hypercholesterolemia    Type 2 diabetes mellitus with diabetic neuropathy, without long-term current use of insulin (CMS/HCC)       History and Hospital Course:  Brian Pena is a 77 y.o. male who was admitted for right total knee arthroplastyafter failing conservative management.  Tolerated procedure well, hemodynamically stable throughout hospital stay.  Eliquis and SCDs used for VTE prophylaxis. Recommended for discharge to SNF. Pain well controlled on oral medications at time of discharge.         Discharge Medications:  See AVS    Discharge Diet:   regular    Discharge Activity:  WBAT    Discharge Followup:  2 weeks with Dr. Kara Pacer     Discharge planning required (time):  <30 min

## 2023-03-13 ENCOUNTER — Emergency Department: Payer: No Typology Code available for payment source

## 2023-03-13 ENCOUNTER — Emergency Department
Admission: EM | Admit: 2023-03-13 | Discharge: 2023-03-13 | Disposition: A | Discharge status: Other Acute Inpatient Hospital | Payer: No Typology Code available for payment source | Attending: Emergency Medicine | Admitting: Emergency Medicine

## 2023-03-13 DIAGNOSIS — I10 Essential (primary) hypertension: Secondary | ICD-10-CM | POA: Diagnosis present

## 2023-03-13 DIAGNOSIS — I4891 Unspecified atrial fibrillation: Secondary | ICD-10-CM | POA: Insufficient documentation

## 2023-03-13 DIAGNOSIS — L03115 Cellulitis of right lower limb: Secondary | ICD-10-CM | POA: Insufficient documentation

## 2023-03-13 DIAGNOSIS — I480 Paroxysmal atrial fibrillation: Secondary | ICD-10-CM | POA: Diagnosis present

## 2023-03-13 DIAGNOSIS — E782 Mixed hyperlipidemia: Secondary | ICD-10-CM | POA: Diagnosis present

## 2023-03-13 DIAGNOSIS — X58XXXA Exposure to other specified factors, initial encounter: Secondary | ICD-10-CM | POA: Insufficient documentation

## 2023-03-13 DIAGNOSIS — D649 Anemia, unspecified: Secondary | ICD-10-CM | POA: Insufficient documentation

## 2023-03-13 DIAGNOSIS — R079 Chest pain, unspecified: Secondary | ICD-10-CM

## 2023-03-13 DIAGNOSIS — S8011XA Contusion of right lower leg, initial encounter: Secondary | ICD-10-CM | POA: Insufficient documentation

## 2023-03-13 DIAGNOSIS — E1142 Type 2 diabetes mellitus with diabetic polyneuropathy: Secondary | ICD-10-CM | POA: Diagnosis present

## 2023-03-13 LAB — ECG 12-LEAD
Q-T Interval: 366 ms
QRS Duration: 86 ms
QTC Calculation (Bezet): 464 ms
R Axis: -46 degrees
T Axis: -10 degrees
Ventricular Rate: 97 {beats}/min

## 2023-03-13 LAB — LAB USE ONLY - CBC WITH DIFFERENTIAL
Absolute Basophils: 0.01 10*3/uL (ref 0.00–0.08)
Absolute Eosinophils: 0.06 10*3/uL (ref 0.00–0.44)
Absolute Immature Granulocytes: 0.07 10*3/uL (ref 0.00–0.07)
Absolute Lymphocytes: 1.04 10*3/uL (ref 0.42–3.22)
Absolute Monocytes: 0.79 10*3/uL (ref 0.21–0.85)
Absolute Neutrophils: 10.04 10*3/uL — ABNORMAL HIGH (ref 1.10–6.33)
Absolute nRBC: 0 10*3/uL (ref <=0.00)
Basophils %: 0.1 %
Eosinophils %: 0.5 %
Hematocrit: 23.8 % — ABNORMAL LOW (ref 37.6–49.6)
Hemoglobin: 7.7 g/dL — ABNORMAL LOW (ref 12.5–17.1)
Immature Granulocytes %: 0.6 %
Lymphocytes %: 8.7 %
MCH: 30.6 pg (ref 25.1–33.5)
MCHC: 32.4 g/dL (ref 31.5–35.8)
MCV: 94.4 fL (ref 78.0–96.0)
MPV: 10 fL (ref 8.9–12.5)
Monocytes %: 6.6 %
Neutrophils %: 83.5 %
Platelet Count: 318 10*3/uL (ref 142–346)
Preliminary Absolute Neutrophil Count: 10.04 10*3/uL — ABNORMAL HIGH (ref 1.10–6.33)
RBC: 2.52 10*6/uL — ABNORMAL LOW (ref 4.20–5.90)
RDW: 16 % — ABNORMAL HIGH (ref 11–15)
WBC: 12.01 10*3/uL — ABNORMAL HIGH (ref 3.10–9.50)
nRBC %: 0 /100 WBC (ref <=0.0)

## 2023-03-13 LAB — COMPREHENSIVE METABOLIC PANEL
ALT: 62 U/L — ABNORMAL HIGH (ref 0–55)
AST (SGOT): 64 U/L — ABNORMAL HIGH (ref 5–41)
Albumin/Globulin Ratio: 1.3 (ref 0.9–2.2)
Albumin: 3.2 g/dL — ABNORMAL LOW (ref 3.5–5.0)
Alkaline Phosphatase: 96 U/L (ref 37–117)
Anion Gap: 9 (ref 5.0–15.0)
BUN: 27 mg/dL (ref 9–28)
Bilirubin, Total: 1 mg/dL (ref 0.2–1.2)
CO2: 22 mEq/L (ref 17–29)
Calcium: 8.8 mg/dL (ref 7.9–10.2)
Chloride: 108 mEq/L (ref 99–111)
Creatinine: 0.8 mg/dL (ref 0.5–1.5)
GFR: >60 mL/min/{1.73_m2} (ref >=60.0)
Globulin: 2.4 g/dL (ref 2.0–3.6)
Glucose: 143 mg/dL — ABNORMAL HIGH (ref 70–100)
Potassium: 4.6 mEq/L (ref 3.5–5.3)
Protein, Total: 5.6 g/dL — ABNORMAL LOW (ref 6.0–8.3)
Sodium: 139 mEq/L (ref 135–145)

## 2023-03-13 LAB — LAB USE ONLY - LIGHT GREEN - LIHEP PST HOLD TUBE

## 2023-03-13 LAB — LAB USE ONLY - URINE YELLOW-RED HOLD TUBE

## 2023-03-13 LAB — PT/INR
INR: 1.3 (ref 0.9–1.1)
PT: 14.6 s — ABNORMAL HIGH (ref 10.1–12.9)

## 2023-03-13 LAB — LAB USE ONLY - URINE GRAY CULTURE HOLD TUBE

## 2023-03-13 LAB — LACTIC ACID: Whole Blood Lactic Acid: 1.8 mmol/L (ref 0.2–2.0)

## 2023-03-13 LAB — POCT OCCULT BLOOD STOOL: Stool Occult Blood: NEGATIVE

## 2023-03-13 MED ORDER — STERILE WATER FOR INJECTION IJ/IV SOLN (WRAP)
2.0000 g | Freq: Once | INTRAMUSCULAR | Status: AC
Start: 2023-03-13 — End: 2023-03-13
  Administered 2023-03-13: 2 g via INTRAVENOUS
  Filled 2023-03-13: qty 2000

## 2023-03-13 MED ORDER — MORPHINE SULFATE 4 MG/ML IJ/IV SOLN (WRAP)
4.0000 mg | Freq: Once | Status: AC
Start: 2023-03-13 — End: 2023-03-13
  Administered 2023-03-13: 4 mg via INTRAVENOUS
  Filled 2023-03-13: qty 1

## 2023-03-13 MED ORDER — VANCOMYCIN HCL IN NACL 1.25-0.9 GM/250ML-% IV SOLN
20.0000 mg/kg | Freq: Once | INTRAVENOUS | Status: AC
Start: 2023-03-13 — End: 2023-03-13
  Administered 2023-03-13: 1250 mg via INTRAVENOUS
  Filled 2023-03-13: qty 250

## 2023-03-13 MED ORDER — IOHEXOL 350 MG/ML IV SOLN
150.0000 mL | Freq: Once | INTRAVENOUS | Status: AC | PRN
Start: 2023-03-13 — End: 2023-03-13
  Administered 2023-03-13: 150 mL via INTRAVENOUS

## 2023-03-13 MED ORDER — SODIUM CHLORIDE 0.9 % IV SOLN
INTRAVENOUS | Status: DC
Start: 2023-03-13 — End: 2023-03-13

## 2023-03-13 MED ORDER — ONDANSETRON HCL 4 MG/2ML IJ SOLN
4.0000 mg | Freq: Once | INTRAMUSCULAR | Status: AC
Start: 2023-03-13 — End: 2023-03-13
  Administered 2023-03-13: 4 mg via INTRAVENOUS
  Filled 2023-03-13: qty 2

## 2023-03-13 NOTE — ED Notes (Signed)
Called [facility] transfer line [phone]) to check on the status of the transfer that was initiated ~2 hours ago, and was told that there is no timeframe for [facility] Nurse Supervisor to respond to our request.

## 2023-03-13 NOTE — ED to IP RN Note (Signed)
[facility]  ED NURSING NOTE FOR THE RECEIVING INPATIENT NURSE   ED NURSE [place] [phone]   ED CHARGE RN Lauren   ADMISSION INFORMATION   Brian Pena is a 77 y.o. male admitted with an ED diagnosis of:    1. Cellulitis of right lower leg    2. Hematoma of right lower extremity, initial encounter    3. Anemia, unspecified type         Isolation: None   Allergies: Linezolid   Holding Orders confirmed? Yes   Belongings Documented? No   Home medications sent to pharmacy confirmed? N/A   NURSING CARE   Patient Comes From:   Mental Status: Home/Family Care  alert and oriented   ADL: Needs assistance with ADLs   Ambulation: ambulates with: walker   Pertinent Information  and Safety Concerns:     [place] Violence Risk Level: Low please call RN     CT / NIH   CT Head ordered on this patient?  No   NIH/Dysphagia assessment done prior to admission? N/A   VITAL SIGNS (at the time of this note)      Vitals:    03/13/23 1036   BP: 118/71   Pulse: 81   Resp: 14   Temp:    SpO2: 100%     Pain Score: 5-moderate pain (10 if standing) (03/13/23 0854)

## 2023-03-13 NOTE — ED Triage Notes (Signed)
Patient Reports: R total knee replacement 03/03/23, here today for RLE pain, redness, warmth. Pt supposed to see surgical PA on Monday, here today because RLE looks and feels worse.     Patient Denies: fevers, numbness, tingling, injury.      Pt is taking Eliquis.

## 2023-03-13 NOTE — ED Provider Notes (Addendum)
EMERGENCY DEPARTMENT NOTE    None        HISTORY OF PRESENT ILLNESS   Translator Used: No    Chief Complaint: Leg Pain       77 y.o. 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 Kara Pacer at [facility], presents with c/o R lower leg swelling, redness, warmth for the past 2 days. Reports able to ambulate s/p surgery until today where it hurts to walk. He denies pain in his knee but rather his lower leg that is red and  swollen. No fever, chills. No chest pain, shortness of breath.                   MEDICAL HISTORY     Past Medical History:  Past Medical History:   Diagnosis Date    Anemia     Basal cell carcinoma of skin 09/07/2002    Benign prostatic hyperplasia with urinary obstruction 08/05/2016    Cardiomyopathy 05/03/2012    Ejection fraction 40-45% on recent echocardiogram.    Cerebrovascular accident     Current moderate episode of major depressive disorder without prior episode 01/11/2022    DDD (degenerative disc disease), cervical 02/19/2023    Diabetes mellitus     History of cerebrovascular accident 03/08/2011    History of smoking 12/25/2021    One half pack a day for 50 years, quit 2014    Hyperlipidemia     Hypertension     Lumbar stenosis with neurogenic claudication 09/10/2015    Lymphedema 02/19/2023    Neuropathy 04/27/2016    Osteoarthritis of both knees, unspecified osteoarthritis type 02/19/2023    Paroxysmal atrial fibrillation 09/07/2004    Peripheral vascular disease 11/14/2018    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    Venous stasis dermatitis of both lower extremities 02/19/2023       Past Surgical History:  Past Surgical History:   Procedure Laterality Date    BACK SURGERY  2017    scoliosis and stenosis - 3 surgeries total    CERVICAL SPINE SURGERY  2019    KNEE SURGERY Left 10/08/1978    medial meniscectomy       Social History:  Social History     Socioeconomic History    Marital status: Married   Tobacco Use    Smoking status: Former     Current packs/day: 0.00      Average packs/day: 0.5 packs/day for 50.0 years (25.0 ttl pk-yrs)     Types: Cigarettes     Start date: 1964     Quit date: 2014     Years since quitting: 10.5    Smokeless tobacco: Never   Vaping Use    Vaping status: Never Used   Substance and Sexual Activity    Alcohol use: Not Currently     Alcohol/week: 0.0 standard drinks of alcohol    Drug use: Yes     Comment: cannabis gummies for pain    Sexual activity: Yes     Partners: Female     Social Determinants of Health     Food Insecurity: No Food Insecurity (03/13/2023)    Hunger Vital Sign     Worried About Running Out of Food in the Last Year: Never true     Ran Out of Food in the Last Year: Never true   Transportation Needs: No Transportation Needs (03/13/2023)    PRAPARE - Therapist, art (Medical): No  Lack of Transportation (Non-Medical): No   Social Connections: Unknown (03/04/2023)    Received from [facility]    Social Connection and Isolation Panel [NHANES]     Marital Status: Married   Intimate Partner Violence: Not At Risk (03/13/2023)    Humiliation, Afraid, Rape, and Kick questionnaire     Fear of Current or Ex-Partner: No     Emotionally Abused: No     Physically Abused: No     Sexually Abused: No   Housing Stability: Low Risk  (03/13/2023)    Housing Stability Vital Sign     Unable to Pay for Housing in the Last Year: No     Number of Places Lived in the Last Year: 1     Unstable Housing in the Last Year: No       Family History:  Family History   Problem Relation Age of Onset    Diabetes Mother     Heart disease Father     Stroke Father     Diabetes Father          PHYSICAL EXAM     ED Triage Vitals [03/13/23 0854]   Enc Vitals Group      BP 123/70      Heart Rate (!) 112      Resp Rate 18      Temp 97.8 F (36.6 C)      Temp Source Oral      SpO2 98 %      Weight 63.5 kg      Height 1.778 m      Head Circumference       Peak Flow       Pain Score 5      Pain Loc       Pain Edu?       Excl. in GC?           Nursing note and vitals reviewed.  Constitutional:  Well developed, well nourished.   Head:  Atraumatic. Normocephalic.    Eyes:  Normal Sclera. EOMI.   ENT:  Patent airway.  Neck:  Supple. Normal ROM.   Cardiovascular:  Regular rate. irregular rhythm. No murmurs, rubs, or gallops.  Pulmonary/Chest:  No evidence of respiratory distress. Clear to auscultation bilaterally.  No wheezing, rales or rhonchi.   Abdominal:  Soft and non-distended. There is no tenderness. No rebound, guarding, or rigidity.   Back:   No CVAT. Nontender.   Extremities:  R knee incision c/d/I with surrounding R knee ecchymosis extending up to thigh and down to lower leg. R lower leg redness, warmth, swelling. No pain with ROM of R knee. DP pulse 2+.   Skin:  Skin is warm and dry.  No diaphoresis.    Neurological:  Alert, awake, and appropriate. Normal speech. Motor normal.  Psychiatric:  Good eye contact. Normal interaction, affect, and behavior.      MEDICAL DECISION MAKING     EMERGENCY DEPT. MEDICATIONS      ED Medication Orders (From admission, onward)      Start Ordered     Status Ordering Provider    03/13/23 1350 03/13/23 1349  0.9% NaCl infusion  Continuous        Route: Intravenous       Last MAR action: Stopped Giulietta Prokop MEI    03/13/23 1231 03/13/23 1231  iohexol (OMNIPAQUE) 350 MG/ML injection 150 mL  IMG once as needed  Route: Intravenous  Ordered Dose: 150 mL       Last MAR action: Imaging Agent Given Cerenity Goshorn MEI    03/13/23 1000 03/13/23 0919  vancomycin (VANCOCIN) 1250 mg in sodium chloride 0.9% 250 mL (premix)  Once        Route: Intravenous  Ordered Dose: 20 mg/kg       Last MAR action: Stopped Kyrstin Campillo MEI    03/13/23 0920 03/13/23 0919  ondansetron (ZOFRAN) injection 4 mg  Once        Route: Intravenous  Ordered Dose: 4 mg       Last MAR action: Given Ambria Mayfield MEI    03/13/23 0920 03/13/23 0919  morphine injection 4 mg  Once        Route: Intravenous  Ordered Dose: 4 mg       Last MAR action: Given Christon Gallaway,  Basilio Meadow MEI    03/13/23 0920 03/13/23 0919  cefTRIAXone (ROCEPHIN) 2 g in sterile water (preservative free) 20 mL IV push injection  Once        Route: Intravenous  Ordered Dose: 2 g       Last MAR action: Given Hildreth Orsak MEI            LABORATORY RESULTS    Ordered and independently interpreted AVAILABLE laboratory tests.   Results       Procedure Component Value Units Date/Time    Rainbow Draw [[id number]] Collected: 03/13/23 0923    Specimen: Blood, Venous Updated: 03/13/23 1400    Narrative:      The following orders were created for panel order Rainbow Draw.  Procedure                               Abnormality         Status                     ---------                               -----------         ------                     Doylene Canning SST Hold BMWU[[id number]]                                                          Light Green - LiHep PST .Marland Kitchen.[[id number]]                      In process                 Light Blue - Citrate Hol.Marland KitchenMarland Kitchen[[id number]]                                                 Lavender - EDTA Hold [phone]  Urine [employer] .Marland KitchenMarland Kitchen[[id number]]                      Final result               Urine Yellow-Red Hold [phone]                       Final result                 Please view results for these tests on the individual orders.    Urine [employer] Tube [[id number]] Collected: 03/13/23 1246    Specimen: Urine, Clean Catch Updated: 03/13/23 1400     Extra Tube Hold for add-ons.    Urine Yellow-Red Hold Tube [[id number]] Collected: 03/13/23 1246    Specimen: Urine, Clean Catch Updated: 03/13/23 1400     Extra Tube Hold for add-ons.    POCT occult blood stool [[id number]] Collected: 03/13/23    Specimen: Stool Updated: 03/13/23 1255     POCT QC Pass     Stool Occult Blood Negative    PT/INR [[id number]]  (Abnormal) Collected: 03/13/23 0951    Specimen: Blood, Venous Updated: 03/13/23 1015     PT 14.6 sec      INR 1.3    Comprehensive  Metabolic Panel [[id number]]  (Abnormal) Collected: 03/13/23 0923    Specimen: Blood, Venous Updated: 03/13/23 1001     Glucose 143 mg/dL      BUN 27 mg/dL      Creatinine 0.8 mg/dL      Sodium 956 mEq/L      Potassium 4.6 mEq/L      Chloride 108 mEq/L      CO2 22 mEq/L      Calcium 8.8 mg/dL      Anion Gap 9.0     GFR >60.0 mL/min/1.73 m2      AST (SGOT) 64 U/L      ALT 62 U/L      Alkaline Phosphatase 96 U/L      Albumin 3.2 g/dL      Protein, Total 5.6 g/dL      Globulin 2.4 g/dL      Albumin/Globulin Ratio 1.3     Bilirubin, Total 1.0 mg/dL     CBC with Differential [[id number]]  (Abnormal) Collected: 03/13/23 0923    Specimen: Blood, Venous Updated: 03/13/23 0945    Narrative:      The following orders were created for panel order CBC with Differential.  Procedure                               Abnormality         Status                     ---------                               -----------         ------                     CBC with Differential[[id number]]        Abnormal            Final result                 Please view results for  these tests on the individual orders.    CBC with Differential [[id number]]  (Abnormal) Collected: 03/13/23 0923    Specimen: Blood, Venous Updated: 03/13/23 0945     WBC 12.01 x10 3/uL      Hemoglobin 7.7 g/dL      Hematocrit 40.9 %      Platelet Count 318 x10 3/uL      MPV 10.0 fL      RBC 2.52 x10 6/uL      MCV 94.4 fL      MCH 30.6 pg      MCHC 32.4 g/dL      RDW 16 %      nRBC % 0.0 /100 WBC      Absolute nRBC 0.00 x10 3/uL      Preliminary Absolute Neutrophil Count 10.04 x10 3/uL      Neutrophils % 83.5 %      Lymphocytes % 8.7 %      Monocytes % 6.6 %      Eosinophils % 0.5 %      Basophils % 0.1 %      Immature Granulocytes % 0.6 %      Absolute Neutrophils 10.04 x10 3/uL      Absolute Lymphocytes 1.04 x10 3/uL      Absolute Monocytes 0.79 x10 3/uL      Absolute Eosinophils 0.06 x10 3/uL      Absolute Basophils 0.01 x10 3/uL      Absolute Immature Granulocytes 0.07 x10 3/uL      Lactic Acid [[id number]]  (Normal) Collected: 03/13/23 0923    Specimen: Blood, Venous Updated: 03/13/23 0944     Whole Blood Lactic Acid 1.8 mmol/L     Culture, Blood, Aerobic And Anaerobic [[id number]] Collected: 03/13/23 0923    Specimen: Blood, Venous Updated: 03/13/23 0942    Culture, Blood, Aerobic And Anaerobic [[id number]] Collected: 03/13/23 0923    Specimen: Blood, Venous Updated: 03/13/23 0942    Sherlynn Stalls [place] Si LiHep PST Hold Tube [[id number]] Collected: 03/13/23 0923    Specimen: Blood, Venous Updated: 03/13/23 0940            RADIOLOGY IMAGING STUDIES    Ordered and independently interpreted AVAILABLE imaging studies.   CT Venogram Lower Extremity Right   Final Result      1.Tiny sub-2 cm collection along the right calf, unlikely of clinical   significance.   2.Gas containing rim enhancing suprapatellar joint effusion, likely   postsurgical in nature. Superinfection is not excluded.   3.Diffuse subcutaneous and subfascial soft tissue edema in the lower   extremity.      August Albino   03/13/2023 12:59 PM      [place] VenoDopp Low Extremity Right   Final Result       1. No sonographic evidence for right lower extremity deep venous   thrombosis.   2. Right calf collection, possible hematoma. Right lower extremity edema.      Prince Solian, MD   03/13/2023 10:33 AM                PRIMARY PROBLEM LIST       Differential Diagnosis includes but not limited to: cellulitis, DVT        Independent interpretation of  EKG:   Signed and interpreted by the EP.    Time Interpreted: 0926   Rate:  97   Rhythm: atrial fibrillation   Axis: left axis deviation   Interval: normal   Blocks: none   ST  segment: anterior infarct, age undetermined   Interpretation: No STEMI       Independent visualized and interpretation of radiological study by me:     US Doppler Extremity RLE Interpreted by me (ED Provider)  RESULT: No DVT  IMPRESSION: No acute abnormality            ED Course as of 03/13/23 1609   Sat Mar 13, 2023   0930 Will give dose of  Vanc and Rocephin to cover cellulitis.  [SL]   1000 Hgb 7.7, pt denies melena or rectal bleeding, likely post op with significant bruising and swelling to R leg.  [SL]   1036 [place] negative for DVT but with R calf collection likely hematoma [SL]   1050 Discussed with Dr. Tyler Aas covering for Dr. Kara Pacer, recommend medical admission for IV abx, and pt can stay at our facility since knee does not appear infected.   [SL]   1127 Discussed with hospitalist NP, requesting CT of leg prior to accepting admission. [SL]   1254 Guaiac negative with tan brown stool.  [SL]   1327 Extensive discussion with hospitalist via secure chat, ID involved, recommend transfer to operating hospital.  [SL]   1333 Discussed with ortho Dr. Tyler Aas again, ok with patient being transferred to [facility].  [SL]   1522 Awaiting to hear back from [facility], was told there is no timeframe to hearback from nursing supervisor, will contact ED for ER to ER transfer.  [SL]   1550 Discussed with ER Dr. Hillery Aldo, accepted pt for transfer.  [SL]      ED Course User Index  [SL] Ollen Barges, MD                         ______________________________________________________________________        Additional Notes                                        DIAGNOSIS      Diagnosis:  Final diagnoses:   Cellulitis of right lower leg   Hematoma of right lower extremity, initial encounter   Anemia, unspecified type   Atrial fibrillation, unspecified type       Disposition:  ED Disposition       ED Disposition   Transfer to Another Facility    Condition   --    Date/Time   Sat Mar 13, 2023  3:57 PM    Comment   Brian Pena should be transferred out to [facility]. Accepted by Dr. Hillery Aldo.                  Prescriptions:  Patient's Medications   New Prescriptions    No medications on file   Previous Medications    ACETAMINOPHEN (TYLENOL) 500 MG TABLET    Take 1 tablet (500 mg) by mouth every 6 (six) hours as needed for Pain    APIXABAN (ELIQUIS) 2.5 MG    TAKE 1 TABLET(2.5 MG) BY MOUTH EVERY 12  HOURS    BLOOD GLUCOSE MONITORING SUPPL (ACCU-CHEK AVIVA PLUS) W/DEVICE KIT    Use as Directed    CALCIUM PO    Take 1,000 mg by mouth daily    GLIMEPIRIDE (AMARYL) 2 MG TABLET    TAKE 1 TABLET(2 MG) BY MOUTH EVERY MORNING    GLUCOSE BLOOD TEST STRIP    Accu-Chek Aviva Plus test  strips    HYDROCODONE-ACETAMINOPHEN (NORCO) 5-325 MG PER TABLET    TAKE 1 TABLET BY MOUTH 1 TO 2 TIMES DAILY FOR SEVERE PAIN    LANCETS (ACCU-CHEK MULTICLIX) LANCETS    Accu-Chek Softclix Lancets    LISINOPRIL (ZESTRIL) 20 MG TABLET    TAKE 1 TABLET BY MOUTH EVERY DAY    METFORMIN (GLUCOPHAGE) 1000 MG TABLET    TAKE 1 TABLET BY MOUTH TWICE DAILY    METOPROLOL SUCCINATE XL (TOPROL-XL) 25 MG 24 HR TABLET    TAKE 1 TABLET(25 MG) BY MOUTH DAILY    RANITIDINE HCL (RANITIDINE 75 PO)    Take 150 mg by mouth daily    ROSUVASTATIN (CRESTOR) 5 MG TABLET    TAKE 1 TABLET BY MOUTH EVERY DAY    TAMSULOSIN (FLOMAX) 0.4 MG CAP    Take 1 capsule (0.4 mg) by mouth 2 (two) times daily    TRAZODONE (DESYREL) 50 MG TABLET    TAKE 1 TABLET(50 MG) BY MOUTH EVERY NIGHT    TRIAMCINOLONE (KENALOG) 0.1 % CREAM    Apply topically 2 (two) times daily Apply to affected area twice daily   Modified Medications    No medications on file   Discontinued Medications    No medications on file           Procedures      Documentation:      Parts of this note were generated by the Epic EMR system/ Dragon speech recognition and may contain inherent errors or omissions not intended by the user. Grammatical errors, random word insertions, deletions, pronoun errors and incomplete sentences are occasional consequences of this technology due to software limitations. Not all errors are caught or corrected.     My documentation is often completed after the patient is no longer under my clinical care. In some cases, the Epic EMR may pull updated results into the above documentation which may not reflect all results or information that was available to me at the time of my medical decision  making.           Ollen Barges, MD  03/13/23 1557       Ollen Barges, MD  03/13/23 1559       Ollen Barges, MD  03/13/23 [phone]

## 2023-03-18 LAB — CULTURE BLOOD AEROBIC AND ANAEROBIC
Culture Blood: NO GROWTH
Culture Blood: NO GROWTH

## 2023-03-31 ENCOUNTER — Other Ambulatory Visit (INDEPENDENT_AMBULATORY_CARE_PROVIDER_SITE_OTHER): Payer: Self-pay

## 2023-03-31 DIAGNOSIS — F5101 Primary insomnia: Secondary | ICD-10-CM

## 2023-04-05 ENCOUNTER — Other Ambulatory Visit (INDEPENDENT_AMBULATORY_CARE_PROVIDER_SITE_OTHER): Payer: Self-pay

## 2023-04-05 DIAGNOSIS — E1142 Type 2 diabetes mellitus with diabetic polyneuropathy: Secondary | ICD-10-CM

## 2023-04-05 DIAGNOSIS — E782 Mixed hyperlipidemia: Secondary | ICD-10-CM

## 2023-04-05 DIAGNOSIS — I10 Essential (primary) hypertension: Secondary | ICD-10-CM

## 2023-04-13 ENCOUNTER — Encounter (INDEPENDENT_AMBULATORY_CARE_PROVIDER_SITE_OTHER): Payer: Self-pay

## 2023-04-19 ENCOUNTER — Telehealth (INDEPENDENT_AMBULATORY_CARE_PROVIDER_SITE_OTHER): Payer: Self-pay

## 2023-04-19 NOTE — Telephone Encounter (Signed)
Na

## 2023-04-23 ENCOUNTER — Encounter (INDEPENDENT_AMBULATORY_CARE_PROVIDER_SITE_OTHER): Payer: Self-pay

## 2023-04-23 ENCOUNTER — Ambulatory Visit (INDEPENDENT_AMBULATORY_CARE_PROVIDER_SITE_OTHER): Payer: No Typology Code available for payment source

## 2023-04-23 VITALS — BP 90/40 | HR 90 | Temp 99.0°F | Ht 69.0 in | Wt 150.0 lb

## 2023-04-23 DIAGNOSIS — R531 Weakness: Secondary | ICD-10-CM

## 2023-04-23 DIAGNOSIS — Z09 Encounter for follow-up examination after completed treatment for conditions other than malignant neoplasm: Secondary | ICD-10-CM

## 2023-04-23 DIAGNOSIS — G629 Polyneuropathy, unspecified: Secondary | ICD-10-CM

## 2023-04-23 DIAGNOSIS — D649 Anemia, unspecified: Secondary | ICD-10-CM

## 2023-04-23 DIAGNOSIS — E162 Hypoglycemia, unspecified: Secondary | ICD-10-CM

## 2023-04-23 DIAGNOSIS — F5101 Primary insomnia: Secondary | ICD-10-CM

## 2023-04-23 DIAGNOSIS — E871 Hypo-osmolality and hyponatremia: Secondary | ICD-10-CM

## 2023-04-23 DIAGNOSIS — E1142 Type 2 diabetes mellitus with diabetic polyneuropathy: Secondary | ICD-10-CM

## 2023-04-23 DIAGNOSIS — M79661 Pain in right lower leg: Secondary | ICD-10-CM

## 2023-04-23 MED ORDER — OXYCODONE HCL 10 MG PO TABS
10.0000 mg | ORAL_TABLET | Freq: Four times a day (QID) | ORAL | 0 refills | Status: DC | PRN
Start: 2023-04-23 — End: 2023-07-29

## 2023-04-23 MED ORDER — TRAZODONE HCL 50 MG PO TABS
150.0000 mg | ORAL_TABLET | Freq: Every evening | ORAL | 0 refills | Status: DC
Start: 2023-04-23 — End: 2023-07-29

## 2023-04-23 MED ORDER — NALOXONE HCL 4 MG/0.1ML NA LIQD
NASAL | 0 refills | Status: DC
Start: 2023-04-23 — End: 2023-07-29

## 2023-04-23 NOTE — Progress Notes (Unsigned)
[facility]                       Date of Exam: 04/23/2023 8:11 AM        Patient ID: Brian Pena is a 77 y.o. male.  Attending Physician: Domingo Cocking, FNP        Chief Complaint:    Chief Complaint   Patient presents with    Diabetes Follow-up     Hospital follow up due to hypoglycemia 8/6-04/14/2023               HPI:    77 year old male presenting for hospital follow up   Seen today with his wife and daughter     He was taken to the hospital via EMS with severe hypoglycemia and was hospitalized from 08/6-08/07  He was unresponsive at home and was found to have glucose of 46 by EMS   Given IM glucagon in the field   Patient's metformin and sulfonylurea were stopped, his A1c is < 6- he also had low BP was started on midodrine. Thought to also be due to poor PO intake.  He is generally improved   Monitoring BP and taking midodrine if SBP is <100  He is only taking Metformin when sugar is 160 or greater     + PHQ today, pt states this is related to his poor sleep   He get's anxious as nighttime approaches because he is anticipating the poor sleep   He is restless at night   Has tried lorazepam for sleep, unsure that it helps   They have tried Remeron   They have also tried Trazodone 100 mg   Family would like to have his Vit D level checked     He is recovering from right TKA, post op course was complicated by cellulitis   He is overall doing well with his knee. For pain management he is taking 1 oxycodone in the AM if he does physical therapy and 1 again at bedtime.   Currently in home PT and OT daily     Having issues with urinary incontinence but feels this is getting better- follow up with Urology on the 8/30     PHQ  No data recorded              Problem List:    Problem List[1]          Current Meds:    Medications Taking[2]       Allergies:    Allergies[3]          Past Surgical History:    Past Surgical History:   Procedure Laterality Date    BACK SURGERY  2017     scoliosis and stenosis - 3 surgeries total    CERVICAL SPINE SURGERY  2019    KNEE SURGERY Left 10/08/1978    medial meniscectomy           Family History:    Family History   Problem Relation Age of Onset    Diabetes Mother     Heart disease Father     Stroke Father     Diabetes Father            Social History:    Social History[4]        The following sections were reviewed this encounter by the provider:   Tobacco  Allergies  Meds  Problems  Med Hx  Surg Hx  Fam Hx             Vital Signs:    BP 90/40   Pulse 90   Temp 99 F (37.2 C)   Ht 1.753 m (5\' 9" )   Wt 68 kg (150 lb)   SpO2 97%   BMI 22.15 kg/m          ROS:    Review of Systems           Physical Exam:    Physical Exam         Assessment:    1. Primary insomnia  - Vitamin D, 25 OH, Total; Future  - Vitamin D, 25 OH, Total  - traZODone (DESYREL) 50 MG tablet; Take 3 tablets (150 mg) by mouth nightly  Dispense: 270 tablet; Refill: 0    2. Hyponatremia  - Comprehensive Metabolic Panel; Future  - Comprehensive Metabolic Panel    3. Generalized weakness  - Comprehensive Metabolic Panel; Future  - Vitamin D, 25 OH, Total; Future  - Comprehensive Metabolic Panel  - Vitamin D, 25 OH, Total    4. Anemia, unspecified type  - CBC with Differential (Order); Future  - CBC with Differential (Order)    5. Pain in right lower leg  - oxyCODONE (ROXICODONE) 10 MG immediate release tablet; Take 1 tablet (10 mg) by mouth every 6 (six) hours as needed for Pain  Dispense: 30 tablet; Refill: 0  - naloxone (NARCAN) 4 MG/0.1ML nasal spray; 1 spray intranasally. If pt does not respond or relapses into respiratory depression call 911. Give additional doses every 2-3 min.  Dispense: 2 each; Refill: 0    6. Neuropathy  - oxyCODONE (ROXICODONE) 10 MG immediate release tablet; Take 1 tablet (10 mg) by mouth every 6 (six) hours as needed for Pain  Dispense: 30 tablet; Refill: 0  - naloxone (NARCAN) 4 MG/0.1ML nasal spray; 1 spray intranasally. If pt does not respond  or relapses into respiratory depression call 911. Give additional doses every 2-3 min.  Dispense: 2 each; Refill: 0            Plan:    Reviewed hospitalization course   Reviewed notes and results from hospitalization   Agree with discontinuation of sulfonylurea and metformin   Will continue with PRN midodrine for hypotension  Continue home PT and OT   Will recheck labs today     Discussed mood and sleep   Will trial increase of trazodone to 150 mg for sleep   Support offered   Pt verbalizes that mood concerns are just related to his sleep issues     Will continue with oxy for pain, rx sent   PMP reviewed   Narcan rx sent   Pt understands risks associated with narcan. Discussed concern for constipation and need for bowel regimen.     Increase water intake  Follow up with urology as scheduled             Follow-up:    No follow-ups on file.         Domingo Cocking, FNP          [1]   Patient Active Problem List  Diagnosis    Type 2 diabetes mellitus with diabetic polyneuropathy, without long-term current use of insulin    Paroxysmal atrial fibrillation    Essential hypertension    Mixed hyperlipidemia    Cellulitis of right lower limb   [2]   Outpatient Medications Marked as  Taking for the 04/23/23 encounter (Office Visit) with Domingo Cocking, FNP   Medication Sig Dispense Refill    acetaminophen (TYLENOL) 500 MG tablet Take 1 tablet (500 mg) by mouth every 6 (six) hours as needed for Pain      apixaban (Eliquis) 2.5 MG TAKE 1 TABLET(2.5 MG) BY MOUTH EVERY 12 HOURS 180 tablet 1    Blood Glucose Monitoring Suppl (Accu-Chek Aviva Plus) w/Device Kit Use as Directed 1 kit 1    CALCIUM PO Take 1,000 mg by mouth daily      Lancets (accu-chek multiclix) lancets Accu-Chek Softclix Lancets      metFORMIN (GLUCOPHAGE) 1000 MG tablet TAKE 1 TABLET BY MOUTH TWICE DAILY 180 tablet 0    metoprolol succinate XL (TOPROL-XL) 25 MG 24 hr tablet TAKE 1 TABLET(25 MG) BY MOUTH DAILY 90 tablet 3    midodrine (PROAMATINE) 5 MG tablet Take 1  tablet (5 mg) by mouth 2 (two) times daily with meals      mirtazapine (REMERON) 15 MG tablet Take 1 tablet (15 mg) by mouth nightly      rosuvastatin (CRESTOR) 5 MG tablet TAKE 1 TABLET BY MOUTH EVERY DAY 90 tablet 0    senna (SENOKOT) 8.6 MG tablet Take 1 tablet (8.6 mg) by mouth daily      sulfamethoxazole-trimethoprim (BACTRIM DS) 800-160 MG per tablet Take 1 tablet by mouth 2 (two) times daily      tamsulosin (FLOMAX) 0.4 MG Cap Take 1 capsule (0.4 mg) by mouth 2 (two) times daily      [DISCONTINUED] oxyCODONE (ROXICODONE) 10 MG immediate release tablet Take 1 tablet (10 mg) by mouth every 6 (six) hours as needed      [DISCONTINUED] raNITIdine HCl (RANITIDINE 75 PO) Take 150 mg by mouth daily      [DISCONTINUED] traZODone (DESYREL) 50 MG tablet TAKE 1 TABLET(50 MG) BY MOUTH EVERY NIGHT 90 tablet 0    [DISCONTINUED] triamcinolone (KENALOG) 0.1 % cream Apply topically 2 (two) times daily Apply to affected area twice daily 45 g 0   [3]   Allergies  Allergen Reactions    Linezolid Nausea Only, Other (See Comments) and Anaphylaxis     weakness  Decreased appetite, nausea, fatigue.  Decreased appetite, nausea, fatigue.      Gabapentin Clouded Consciousness   [4]   Social History  Tobacco Use    Smoking status: Former     Current packs/day: 0.00     Average packs/day: 0.5 packs/day for 50.0 years (25.0 ttl pk-yrs)     Types: Cigarettes     Start date: 1964     Quit date: 2014     Years since quitting: 10.6    Smokeless tobacco: Never   Vaping Use    Vaping status: Never Used   Substance Use Topics    Alcohol use: Not Currently     Alcohol/week: 0.0 standard drinks of alcohol    Drug use: Yes     Comment: cannabis gummies for pain

## 2023-04-24 ENCOUNTER — Encounter (INDEPENDENT_AMBULATORY_CARE_PROVIDER_SITE_OTHER): Payer: Self-pay

## 2023-04-24 DIAGNOSIS — E162 Hypoglycemia, unspecified: Secondary | ICD-10-CM

## 2023-04-24 DIAGNOSIS — E1142 Type 2 diabetes mellitus with diabetic polyneuropathy: Secondary | ICD-10-CM

## 2023-04-24 DIAGNOSIS — E11649 Type 2 diabetes mellitus with hypoglycemia without coma: Secondary | ICD-10-CM

## 2023-04-24 LAB — COMPREHENSIVE METABOLIC PANEL
ALT: 38 IU/L (ref 0–44)
AST (SGOT): 31 IU/L (ref 0–40)
Albumin: 3.9 g/dL (ref 3.8–4.8)
Alkaline Phosphatase: 112 IU/L (ref 44–121)
BUN / Creatinine Ratio: 39 — ABNORMAL HIGH (ref 10–24)
BUN: 40 mg/dL — ABNORMAL HIGH (ref 8–27)
Bilirubin, Total: <0.2 mg/dL (ref 0.0–1.2)
CO2: 20 mmol/L (ref 20–29)
Calcium: 9.8 mg/dL (ref 8.6–10.2)
Chloride: 101 mmol/L (ref 96–106)
Creatinine: 1.02 mg/dL (ref 0.76–1.27)
Globulin, Total: 2.2 g/dL (ref 1.5–4.5)
Glucose: 148 mg/dL — ABNORMAL HIGH (ref 70–99)
Potassium: 5.7 mmol/L — ABNORMAL HIGH (ref 3.5–5.2)
Protein, Total: 6.1 g/dL (ref 6.0–8.5)
Sodium: 136 mmol/L (ref 134–144)
eGFR: 75 mL/min/{1.73_m2} (ref >59)

## 2023-04-24 LAB — CBC AND DIFFERENTIAL
Baso(Absolute): 0 10*3/uL (ref 0.0–0.2)
Basophils Automated: 0 %
Eosinophils Absolute: 0.1 10*3/uL (ref 0.0–0.4)
Eosinophils Automated: 1 %
Hematocrit: 31.5 % — ABNORMAL LOW (ref 37.5–51.0)
Hemoglobin: 9.9 g/dL — ABNORMAL LOW (ref 13.0–17.7)
Immature Granulocytes Absolute: 0 10*3/uL (ref 0.0–0.1)
Immature Granulocytes: 0 %
Lymphocytes Absolute: 0.9 10*3/uL (ref 0.7–3.1)
Lymphocytes Automated: 12 %
MCH: 28.9 pg (ref 26.6–33.0)
MCHC: 31.4 g/dL — ABNORMAL LOW (ref 31.5–35.7)
MCV: 92 fL (ref 79–97)
Monocytes Absolute: 0.5 10*3/uL (ref 0.1–0.9)
Monocytes: 7 %
Neutrophils Absolute Count: 5.9 10*3/uL (ref 1.4–7.0)
Neutrophils: 80 %
Platelets: 259 10*3/uL (ref 150–450)
RBC: 3.43 x10E6/uL — ABNORMAL LOW (ref 4.14–5.80)
RDW: 12.5 % (ref 11.6–15.4)
WBC: 7.4 10*3/uL (ref 3.4–10.8)

## 2023-04-24 LAB — VITAMIN D, 25 OH, TOTAL: Vitamin D 25-Hydroxy: 37.4 ng/mL (ref 30.0–100.0)

## 2023-04-26 ENCOUNTER — Telehealth (INDEPENDENT_AMBULATORY_CARE_PROVIDER_SITE_OTHER): Payer: Self-pay

## 2023-04-26 DIAGNOSIS — E875 Hyperkalemia: Secondary | ICD-10-CM

## 2023-04-26 NOTE — Telephone Encounter (Signed)
FYI

## 2023-04-26 NOTE — Telephone Encounter (Signed)
Pt's wife is calling states pt is going to go to the [facility] for PT. Pt's wife states the [facility] will reach out to our office. Please advise.

## 2023-04-26 NOTE — Telephone Encounter (Signed)
disregard

## 2023-04-30 ENCOUNTER — Encounter (INDEPENDENT_AMBULATORY_CARE_PROVIDER_SITE_OTHER): Payer: Self-pay

## 2023-05-03 MED ORDER — DEXCOM G7 RECEIVER DEVI
0 refills | Status: AC
Start: 2023-05-03 — End: ?

## 2023-05-03 MED ORDER — DEXCOM G7 SENSOR MISC
1.0000 | 3 refills | Status: DC
Start: 2023-05-03 — End: 2023-07-29

## 2023-05-07 ENCOUNTER — Encounter (INDEPENDENT_AMBULATORY_CARE_PROVIDER_SITE_OTHER): Payer: Self-pay

## 2023-05-08 MED ORDER — ACCU-CHEK SOFTCLIX LANCETS MISC
3 refills | Status: DC
Start: 2023-05-08 — End: 2024-05-09

## 2023-05-08 MED ORDER — GLUCOSE BLOOD VI STRP
ORAL_STRIP | 3 refills | Status: DC
Start: 2023-05-08 — End: 2024-05-09

## 2023-05-08 NOTE — Addendum Note (Signed)
Addended by: Domingo Cocking on: 05/08/2023 10:16 AM     Modules accepted: Orders

## 2023-05-11 ENCOUNTER — Encounter (INDEPENDENT_AMBULATORY_CARE_PROVIDER_SITE_OTHER): Payer: Self-pay

## 2023-05-11 DIAGNOSIS — F321 Major depressive disorder, single episode, moderate: Secondary | ICD-10-CM

## 2023-05-11 DIAGNOSIS — I739 Peripheral vascular disease, unspecified: Secondary | ICD-10-CM

## 2023-05-11 NOTE — Progress Notes (Signed)
Last seen on 04/23/23 for pain and neuropathy.Pls assess.

## 2023-05-11 NOTE — Progress Notes (Unsigned)
30 tablets, 0 refill pending for REMERON    Last OV - 04/23/23  Name of Pharmacy and Location - [facility]    NP HEALEY - Please review. Thank you!     Note from last visit  "+ PHQ today, pt states this is related to his poor sleep   He get's anxious as nighttime approaches because he is anticipating the poor sleep   He is restless at night   Has tried lorazepam for sleep, unsure that it helps   They have tried Remeron   They have also tried Trazodone 100 mg   Family would like to have his Vit D level checked "

## 2023-05-12 MED ORDER — MIRTAZAPINE 15 MG PO TABS
15.0000 mg | ORAL_TABLET | Freq: Every evening | ORAL | 0 refills | Status: DC
Start: 2023-05-12 — End: 2023-06-15

## 2023-05-12 NOTE — Telephone Encounter (Signed)
I believe these orders were placed by his Ortho team who did his knee replacement. Defer to Ortho. Please advise.

## 2023-05-18 ENCOUNTER — Other Ambulatory Visit (INDEPENDENT_AMBULATORY_CARE_PROVIDER_SITE_OTHER): Payer: Self-pay

## 2023-05-18 DIAGNOSIS — R600 Localized edema: Secondary | ICD-10-CM

## 2023-05-20 ENCOUNTER — Other Ambulatory Visit (INDEPENDENT_AMBULATORY_CARE_PROVIDER_SITE_OTHER): Payer: Self-pay

## 2023-05-20 DIAGNOSIS — R600 Localized edema: Secondary | ICD-10-CM

## 2023-05-27 ENCOUNTER — Encounter (INDEPENDENT_AMBULATORY_CARE_PROVIDER_SITE_OTHER): Payer: Self-pay

## 2023-05-27 DIAGNOSIS — E875 Hyperkalemia: Secondary | ICD-10-CM

## 2023-06-14 ENCOUNTER — Other Ambulatory Visit (INDEPENDENT_AMBULATORY_CARE_PROVIDER_SITE_OTHER): Payer: Self-pay

## 2023-06-14 DIAGNOSIS — F321 Major depressive disorder, single episode, moderate: Secondary | ICD-10-CM

## 2023-06-14 NOTE — Telephone Encounter (Signed)
Scheduled for 3 mo f/u 11/15 with NP [facility]

## 2023-06-14 NOTE — Telephone Encounter (Signed)
Pt was last seen on 09/14/2022 with NP Edwyna Shell for depression.     Front Desk:   Please reach out to to pt for medication refill.    Thank you

## 2023-06-29 ENCOUNTER — Encounter (INDEPENDENT_AMBULATORY_CARE_PROVIDER_SITE_OTHER): Payer: Self-pay

## 2023-07-01 ENCOUNTER — Telehealth (INDEPENDENT_AMBULATORY_CARE_PROVIDER_SITE_OTHER): Payer: Self-pay

## 2023-07-01 NOTE — Progress Notes (Signed)
Dexcom G7 Sensor  Key: [id number]  PA Case ID #: [id number]  Rx #: [id number]  Prior authorization submitted using Covermymeds  APPROVED - Continuous glucose monitor system (reader, transmitter, and sensor), use as directed, is approved under your Part B benefit through 09/06/2024.

## 2023-07-01 NOTE — Telephone Encounter (Signed)
Dexcom G7 Sensor  Key: [id number]  PA Case ID #: [id number]  Rx #: [id number]  Prior authorization submitted using Covermymeds  APPROVED - Continuous glucose monitor system (reader, transmitter, and sensor), use as directed, is approved under your Part B benefit through 09/06/2024.    Patient was notified using MyChartMsg

## 2023-07-04 ENCOUNTER — Other Ambulatory Visit (INDEPENDENT_AMBULATORY_CARE_PROVIDER_SITE_OTHER): Payer: Self-pay

## 2023-07-04 DIAGNOSIS — E782 Mixed hyperlipidemia: Secondary | ICD-10-CM

## 2023-07-04 DIAGNOSIS — E1142 Type 2 diabetes mellitus with diabetic polyneuropathy: Secondary | ICD-10-CM

## 2023-07-05 ENCOUNTER — Telehealth (INDEPENDENT_AMBULATORY_CARE_PROVIDER_SITE_OTHER): Payer: Self-pay

## 2023-07-05 NOTE — Telephone Encounter (Signed)
Pt is calling in to request an rx refill for [ DexCom G7 device and materials ]    Days of medication left [# of days ]     [facility] to [facility]    [ ]     Please assess and call back with update    Recent Visits  Date Type Provider Dept   04/23/23 Office Visit Domingo Cocking, FNP [facility]   02/19/23 Office Visit Domingo Cocking, FNP [facility]   12/11/22 Office Visit Domingo Cocking, FNP [facility]   12/04/22 Office Visit Domingo Cocking, FNP [facility]   Showing recent visits within past 270 days and meeting all other requirements  Future Appointments  No visits were found meeting these conditions.  Showing future appointments within next 90 days and meeting all other requirements

## 2023-07-06 NOTE — Telephone Encounter (Signed)
Rx for receiver and sensors were sent 05/03/2023. Spoke with pt. He never picked up from the pharmacy. Advised pt to check with pharmacy to pick up.

## 2023-07-23 ENCOUNTER — Encounter (INDEPENDENT_AMBULATORY_CARE_PROVIDER_SITE_OTHER): Payer: Self-pay

## 2023-07-23 ENCOUNTER — Ambulatory Visit (INDEPENDENT_AMBULATORY_CARE_PROVIDER_SITE_OTHER): Payer: No Typology Code available for payment source

## 2023-07-23 VITALS — BP 118/64 | HR 77 | Temp 97.6°F | Ht 70.0 in | Wt 152.0 lb

## 2023-07-23 DIAGNOSIS — E782 Mixed hyperlipidemia: Secondary | ICD-10-CM

## 2023-07-23 DIAGNOSIS — E1142 Type 2 diabetes mellitus with diabetic polyneuropathy: Secondary | ICD-10-CM

## 2023-07-23 DIAGNOSIS — E11649 Type 2 diabetes mellitus with hypoglycemia without coma: Secondary | ICD-10-CM

## 2023-07-23 DIAGNOSIS — D649 Anemia, unspecified: Secondary | ICD-10-CM

## 2023-07-23 DIAGNOSIS — E162 Hypoglycemia, unspecified: Secondary | ICD-10-CM

## 2023-07-23 DIAGNOSIS — F5101 Primary insomnia: Secondary | ICD-10-CM

## 2023-07-23 DIAGNOSIS — I480 Paroxysmal atrial fibrillation: Secondary | ICD-10-CM

## 2023-07-23 NOTE — Progress Notes (Signed)
 [facility] [place] [facility]                       Date of Exam: 07/23/2023 6:18 PM        Patient ID: Brian Pena is a 77 y.o. male.  Attending Physician: Domingo Cocking, FNP        Chief Complaint:    Chief Complaint   Dennie Bible

## 2023-07-24 LAB — CBC AND DIFFERENTIAL
Baso(Absolute): 0 10*3/uL (ref 0.0–0.2)
Basophils Automated: 0 %
Eosinophils Absolute: 0.1 10*3/uL (ref 0.0–0.4)
Eosinophils Automated: 1 %
Hematocrit: 42.5 % (ref 37.5–51.0)
Hemoglobin: 13.2 g/dL (ref 13.0–17.7)
Immature Granulocytes Absolute: 0 10*3/uL (ref 0.0–0.1)
Immature Granulocytes: 0 %
Lymphocytes Absolute: 1.6 10*3/uL (ref 0.7–3.1)
Lymphocytes Automated: 24 %
MCH: 28.1 pg (ref 26.6–33.0)
MCHC: 31.1 g/dL — ABNORMAL LOW (ref 31.5–35.7)
MCV: 91 fL (ref 79–97)
Monocytes Absolute: 0.6 10*3/uL (ref 0.1–0.9)
Monocytes: 9 %
Neutrophils Absolute Count: 4.5 10*3/uL (ref 1.4–7.0)
Neutrophils: 66 %
Platelets: 155 10*3/uL (ref 150–450)
RBC: 4.69 x10E6/uL (ref 4.14–5.80)
RDW: 14.3 % (ref 11.6–15.4)
WBC: 6.8 10*3/uL (ref 3.4–10.8)

## 2023-07-24 LAB — BASIC METABOLIC PANEL
BUN / Creatinine Ratio: 27 — ABNORMAL HIGH (ref 10–24)
BUN: 24 mg/dL (ref 8–27)
CO2: 24 mmol/L (ref 20–29)
Calcium: 9.7 mg/dL (ref 8.6–10.2)
Chloride: 104 mmol/L (ref 96–106)
Creatinine: 0.9 mg/dL (ref 0.76–1.27)
Glucose: 132 mg/dL — ABNORMAL HIGH (ref 70–99)
Potassium: 4.2 mmol/L (ref 3.5–5.2)
Sodium: 143 mmol/L (ref 134–144)
eGFR: 87 mL/min/{1.73_m2} (ref >59)

## 2023-07-24 LAB — HEMOGLOBIN A1C: Hemoglobin A1C: 6.8 % — ABNORMAL HIGH (ref 4.8–5.6)

## 2023-07-29 MED ORDER — ROSUVASTATIN CALCIUM 5 MG PO TABS
5.0000 mg | ORAL_TABLET | Freq: Every day | ORAL | 1 refills | Status: DC
Start: 2023-07-29 — End: 2024-03-27

## 2023-07-29 MED ORDER — APIXABAN 2.5 MG PO TABS
2.5000 mg | ORAL_TABLET | Freq: Two times a day (BID) | ORAL | 1 refills | Status: DC
Start: 2023-07-29 — End: 2024-05-09

## 2023-07-29 MED ORDER — METFORMIN HCL 1000 MG PO TABS
1000.0000 mg | ORAL_TABLET | Freq: Two times a day (BID) | ORAL | 1 refills | Status: DC
Start: 2023-07-29 — End: 2024-03-27

## 2023-07-29 MED ORDER — DEXCOM G7 SENSOR MISC
1.0000 | 3 refills | Status: DC
Start: 2023-07-29 — End: 2024-05-22

## 2023-07-29 MED ORDER — TRAZODONE HCL 50 MG PO TABS
150.0000 mg | ORAL_TABLET | Freq: Every evening | ORAL | 1 refills | Status: DC
Start: 2023-07-29 — End: 2023-12-31

## 2023-12-07 ENCOUNTER — Ambulatory Visit (INDEPENDENT_AMBULATORY_CARE_PROVIDER_SITE_OTHER): Admitting: Student

## 2023-12-20 ENCOUNTER — Encounter (INDEPENDENT_AMBULATORY_CARE_PROVIDER_SITE_OTHER): Payer: Self-pay

## 2023-12-20 ENCOUNTER — Ambulatory Visit (INDEPENDENT_AMBULATORY_CARE_PROVIDER_SITE_OTHER)

## 2023-12-20 VITALS — BP 130/72 | HR 80 | Temp 98.2°F | Wt 149.0 lb

## 2023-12-20 DIAGNOSIS — Z9181 History of falling: Secondary | ICD-10-CM

## 2023-12-20 DIAGNOSIS — Z87440 Personal history of urinary (tract) infections: Secondary | ICD-10-CM

## 2023-12-20 DIAGNOSIS — Z09 Encounter for follow-up examination after completed treatment for conditions other than malignant neoplasm: Secondary | ICD-10-CM

## 2023-12-20 DIAGNOSIS — N3281 Overactive bladder: Secondary | ICD-10-CM

## 2023-12-20 DIAGNOSIS — E1142 Type 2 diabetes mellitus with diabetic polyneuropathy: Secondary | ICD-10-CM

## 2023-12-20 NOTE — Progress Notes (Signed)
 [facility]  [facility]           Subjective     History of Present Illness  Brian Pena is an 78 year old male who presents for follow-up after a fall and hospitalization. He is accompanied by his wife.    He experienced a fall on April 1st while attempting to sit down, resulting in a large hematoma on his left hip. There was no loss of consciousness or head injury, and imaging ruled out fractures. The hematoma began causing pain a few days after the fall, described as a four or five out of ten when walking, but not bothersome when sitting or lying down. Bruising has spread down his leg due to gravity. He is receiving physical therapy at home several days per week, which has been helping.    During his hospital stay, he was diagnosed with a urinary tract infection, believed to be the primary cause of his weakness leading to the fall. He was treated with antibiotics, and a Foley catheter was placed for a week to ensure proper bladder emptying. The Foley was removed last week at the Urologist, and a subsequent bladder scan was normal. He continues to experience nocturia, getting up about five times a night to urinate, which was reduced to once a night when he was on a previous medication for bladder frequency (Mirabegron), which was held during his hospital stay. He is drinking more water  than before, although he acknowledges it is still not as much as he should.    He has a history of diabetes and is currently taking metformin  twice a day. His blood sugar levels have been higher, with morning levels between 110 and 130 mg/dL and rising to 161 mg/dL during the day. In the hospital, his blood sugar was as high as 350 mg/dL. He uses a Dexcom monitor to monitor his blood sugar and is trying to manage his diet. Recent hemoglobin A1c was 6.5% in the hospital. They brought the Dexcom monitor to review today. No hypoglycemic events. He takes Metformin  1000 mg twice daily for his blood sugar.     His  appetite is good, although he has had some difficulty eating due to recent mouth surgery. He is trying to drink more water , although he finds it challenging as he does not care for water . He is also trying to walk every hour at home to stay active.    Review of Systems   As per HPI   Objective   BP 130/72 (BP Site: Right arm, Patient Position: Sitting, Cuff Size: Medium)   Pulse 80   Temp 98.2 F (36.8 C) (Tympanic)   Wt 67.6 kg (149 lb)   SpO2 97%   BMI 21.38 kg/m   Physical Exam  Vitals and nursing note reviewed.   Constitutional:       General: He is not in acute distress.     Appearance: Normal appearance. He is not ill-appearing, toxic-appearing or diaphoretic.   HENT:      Head: Normocephalic.   Cardiovascular:      Rate and Rhythm: Normal rate. Rhythm irregular.   Pulmonary:      Effort: Pulmonary effort is normal. No respiratory distress.      Breath sounds: Normal breath sounds. No wheezing.   Musculoskeletal:      Right lower leg: No edema.      Left lower leg: No edema.   Skin:     General: Skin is warm and dry.  Comments: Diffusion ecchymosis of left lateral hip and thigh   Neurological:      Mental Status: He is alert and oriented to person, place, and time.   Psychiatric:         Mood and Affect: Mood normal.         Behavior: Behavior normal.              Assessment/Plan     Assessment & Plan  Urinary Tract Infection (UTI)  Recent UTI treated with antibiotics. He was discharged with Foley catheter which has been removed by Urology. Currently voiding normally with increased nocturia.  - Monitor for UTI recurrence signs. Discussed how UTIs can present in elderly patients.   - Encourage increased fluid intake, including water  and cranberry juice.  - Consult urologist about resuming overactive bladder medication. However, I think it is likely acceptable to resume Mirabegron from a primary care perspective with close monitoring.     Overactive Bladder  Increased nocturia, voiding five times per  night. Post-Foley scan showed no retention at Urologist. He is voiding without issue.   - Consult urologist about resuming overactive bladder medication.    Left Hip Hematoma  Large hematoma from fall, improving with therapy. No fractures on imaging. Tenderness persists.  - Continue physical therapy.  - Educate on bruising and hematoma reabsorption progression.    Type 2 Diabetes Mellitus  Blood glucose fluctuating, recent A1c at 6.5%. Current management aims to prevent hypoglycemia.  - Continue Metformin  1000 mg twice daily.  - Encourage increased water  intake.  - Monitor glucose with Dexcom.  - Re-evaluate glucose control in 90 days.  - Will not add any additional antidiabetic agent today as I believe the risk of hypoglycemia and falls outweighs the risk of hyperglycemia     General Health Maintenance  Increased water  intake and physical activity encouraged for recovery and fall prevention.  - Encourage regular physical activity, including walking every hour.  - Increase water  intake.    Verbal consent obtained to record this visit.

## 2023-12-21 ENCOUNTER — Other Ambulatory Visit (INDEPENDENT_AMBULATORY_CARE_PROVIDER_SITE_OTHER): Payer: Self-pay | Admitting: Cardiovascular Disease

## 2023-12-21 DIAGNOSIS — I10 Essential (primary) hypertension: Secondary | ICD-10-CM

## 2023-12-21 MED ORDER — METOPROLOL SUCCINATE ER 25 MG PO TB24
25.0000 mg | ORAL_TABLET | Freq: Every day | ORAL | 3 refills | Status: AC
Start: 2023-12-21 — End: ?

## 2023-12-21 NOTE — Telephone Encounter (Signed)
 Received refill request via fax from [facility] for Metoprolol  ER Succinate 25 mg.

## 2023-12-23 ENCOUNTER — Inpatient Hospital Stay (INDEPENDENT_AMBULATORY_CARE_PROVIDER_SITE_OTHER): Admitting: Family Nurse Practitioner

## 2023-12-31 ENCOUNTER — Other Ambulatory Visit (INDEPENDENT_AMBULATORY_CARE_PROVIDER_SITE_OTHER): Payer: Self-pay

## 2023-12-31 DIAGNOSIS — F5101 Primary insomnia: Secondary | ICD-10-CM

## 2023-12-31 MED ORDER — TRAZODONE HCL 50 MG PO TABS
150.0000 mg | ORAL_TABLET | Freq: Every evening | ORAL | 0 refills | Status: DC
Start: 2023-12-31 — End: 2024-04-19

## 2023-12-31 NOTE — Telephone Encounter (Signed)
 Pt is calling in to request an rx refill for:     Trazodone  50mg     2.    3.      Days of medication left [# few days  ]       [facility] to   [facility]      [ Additional information ]        Please assess and call back with update          Recent Visits  Date Type Provider Dept   12/20/23 Office Visit Patrecia Book, FNP [facility]   07/23/23 Office Visit Patrecia Book, FNP [facility]   04/23/23 Office Visit Patrecia Book, FNP [facility]   Showing recent visits within past 270 days and meeting all other requirements  Future Appointments  No visits were found meeting these conditions.  Showing future appointments within next 90 days and meeting all other requirements

## 2023-12-31 NOTE — Telephone Encounter (Addendum)
 [facility]    Please review for NP HEALEY    Patient has an upcoming appointment on March 20 2024.    Medication: Trazodone  50mg    Pended: Qty  270 , Refills 0  Last Visit (Med./ PE): Jul 23 2023

## 2024-01-03 ENCOUNTER — Ambulatory Visit (INDEPENDENT_AMBULATORY_CARE_PROVIDER_SITE_OTHER): Admitting: Family Nurse Practitioner

## 2024-01-03 ENCOUNTER — Encounter (INDEPENDENT_AMBULATORY_CARE_PROVIDER_SITE_OTHER): Payer: Self-pay | Admitting: Family Nurse Practitioner

## 2024-01-03 VITALS — BP 150/70 | HR 84 | Ht 70.0 in | Wt 148.0 lb

## 2024-01-03 DIAGNOSIS — I4819 Other persistent atrial fibrillation: Secondary | ICD-10-CM

## 2024-01-03 DIAGNOSIS — I10 Essential (primary) hypertension: Secondary | ICD-10-CM

## 2024-01-03 DIAGNOSIS — E782 Mixed hyperlipidemia: Secondary | ICD-10-CM

## 2024-01-03 NOTE — Progress Notes (Signed)
 [place]  HEART CARDIOLOGY OFFICE PROGRESS NOTE    [facility] OFFICE  [place]  HEART [facility] OFFICE -CARDIOLOGY  [address]  [place] [address]  Dept: [phone]  Dept Fax: [phone]       Patient Name: Brian Pena, Brian Pena    Date of Visit:  January 03, 2024  Date of Birth: 1946/03/09  AGE: 78 y.o.  Medical Record #: [id number]  Requesting Physician: Brian Book, FNP      CHIEF COMPLAINT: Hospital Follow-up      HISTORY OF PRESENT ILLNESS:    He is a pleasant 78 y.o. male who presents today for hospital follow up after admission to [facility] center 12/07/23 after fall that was complicated by UTI and dehydration. He reports that he has felt better and stronger every day since discharge. He denies chest pain, worsening shortness of breath, dizziness, or palpitations. He has had no further issues with bleeding since decreasing Eliquis  2.5 mg BID.    PAST MEDICAL HISTORY: He has a past medical history of Anemia, Basal cell carcinoma of skin (09/07/2002), Benign prostatic hyperplasia with urinary obstruction (08/05/2016), Cardiomyopathy (CMS/HCC) (05/03/2012), Cerebrovascular accident (CMS/HCC), Current moderate episode of major depressive disorder without prior episode (CMS/HCC) (01/11/2022), DDD (degenerative disc disease), cervical (02/19/2023), Diabetes mellitus (CMS/HCC), History of cerebrovascular accident (03/08/2011), History of smoking (12/25/2021), Hyperlipidemia, Hypertension, Lumbar stenosis with neurogenic claudication (09/10/2015), Lymphedema (02/19/2023), Neuropathy (04/27/2016), Osteoarthritis of both knees, unspecified osteoarthritis type (02/19/2023), Paroxysmal atrial fibrillation (CMS/HCC) (09/07/2004), Peripheral vascular disease (11/14/2018), 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 He has a past surgical history that includes Knee surgery (Left, 10/08/1978); Back surgery (2017); and Cervical spine  surgery (2019).    Allergies  Reviewed by Pena, Brian on 01/03/2024        Severity Reactions Comments    Glimepiride  High  Went into diabetic shock    Linezolid High Nausea Only, Other (See Comments), Anaphylaxis weakness Decreased appetite, nausea, fatigue. Decreased appetite, nausea, fatigue.    Gabapentin Not Specified Clouded Consciousness              MEDICATIONS:   Patient's current medications were reviewed. ONLY Cardiac medications were updated unless others were addressed in assessment and plan.  Current Outpatient Medications (Relevant to Cardiology)   Medication Sig    rosuvastatin  Take 1 tablet (5 mg) by mouth daily    apixaban  Take 1 tablet (2.5 mg) by mouth every 12 (twelve) hours    metoprolol  succinate XL Take 1 tablet (25 mg) by mouth daily     Current Outpatient Medications (Other)   Medication Sig    tamsulosin Take 1 capsule (0.4 mg) by mouth 2 (two) times daily    CALCIUM  PO Take 1,000 mg by mouth daily    acetaminophen Take 1 tablet (500 mg) by mouth every 6 (six) hours as needed for Pain    Accu-Chek Aviva Plus Use as Directed    Dexcom G7 Receiver Use as directed as per manufacturer    Accu-Chek Softclix Lancets Use as directed to check blood glucose 1-2 times daily.    glucose blood Use as directed to check blood glucose 1-2 times daily    HYDROcodone-acetaminophen Take 1 tablet by mouth every 6 (six) hours as needed    Vibegron Take 1 tablet (75 mg) by mouth daily (Patient not taking: Reported on 12/20/2023)    continuous blood glucose sensor Inject 1 Device into the skin every 10 (ten) days Use as directed per manufacturer  metFORMIN  Take 1 tablet (1,000 mg) by mouth 2 (two) times daily    Mirabegron ER Take 1 tablet (25 mg) by mouth (Patient not taking: Reported on 12/20/2023)    finasteride Take 1 tablet (5 mg) by mouth daily    amoxicillin      traZODone  Take 3 tablets (150 mg) by mouth once nightly          FAMILY HISTORY: family history includes Diabetes in his father and mother;  Heart disease in his father; Stroke in his father.    SOCIAL HISTORY: He reports that he quit smoking about 11 years ago. His smoking use included cigarettes. He started smoking about 61 years ago. He has a 25 pack-year smoking history. He has never used smokeless tobacco. He reports that he does not currently use alcohol. He reports current drug use.    PHYSICAL EXAMINATION    Visit Vitals  BP 150/70 (BP Site: Right arm, Patient Position: Sitting, Cuff Size: Medium)   Pulse 84   Ht 1.778 m (5\' 10" )   Wt 67.1 kg (148 lb)   BMI 21.24 kg/m       Constitutional: Cooperative, alert, no acute distress.  Neck: No carotid bruits, JVP normal.  Cardiac: Irregularly irregular rate and rhythm, No murmurs. No rubs, no gallops.  Pulmonary: Clear to auscultation bilaterally, no wheezing, no rhonchi, no rales.  Extremities: trace bilateral ankle edema.  Vascular: +2 pulses in radial artery bilaterally, 2+ pedal pulses bilaterally.    BP Readings from Last 3 Encounters:   01/03/24 150/70   12/20/23 130/72   07/23/23 118/64     Wt Readings from Last 3 Encounters:   01/03/24 67.1 kg (148 lb)   12/20/23 67.6 kg (149 lb)   07/23/23 68.9 kg (152 lb)        LABS REVIEWED:   Lab Results   Component Value Date    WBC 6.8 07/23/2023    HGB 13.2 07/23/2023    HCT 42.5 07/23/2023    PLT 155 07/23/2023     Lab Results   Component Value Date    GLU 132 (H) 07/23/2023    BUN 24 07/23/2023    CREAT 0.90 07/23/2023    NA 143 07/23/2023    K 4.2 07/23/2023    CL 104 07/23/2023    CO2 24 07/23/2023    AST 31 04/23/2023    ALT 38 04/23/2023     Lab Results   Component Value Date    TSH 1.480 11/28/2021    HGBA1C 6.8 (H) 07/23/2023     Lab Results   Component Value Date    CHOL 122 10/16/2022    TRIG 105 10/16/2022    HDL 47 10/16/2022    LDL 56 10/16/2022     No results found for: "LPACHOL"     Most recent hospitalization at [facility] 4/1-12/10/23 study reviewed.    IMPRESSION:   Brian Pena is a 78 y.o. male with the following  problems:    Recent admission for falls with dehydration/UTI  Persistent Atrial fibrillation - on lower dosing of Eliquis  due to a history of hematuria  History of profound sinus bradycardia  Echocardiogram 10/2020: LVEF 60-65%, mild LVH, no significant valvular disease  Hypertension, complicated by hypotension/recent falls  Hyperlipidemia - LDL 48 in March 2023  Diabetes - on Glimepiride , A1c 6.3 in March 2023  Orthopedic issues  Remote tobacco use  Prior CVA 15+ years ago, residual LUE weakness  Peripheral neuropathy  Limited ability to  ambulate due to multiple back surgeries - uses a Rollator  Lower extremity edema      RECOMMENDATIONS:    Continue cardiac medications as prescribed, recommend blood pressure goal of <140/90 due to frequent falls/dehydration on Eliquis . No refills needed today.   Follow up with Dr. Margret Shepherd in 6 months or sooner as clinically indicated.                                               Orders Placed This Encounter   Procedures    Office Visit ([facility] [place])       No orders of the defined types were placed in this encounter.      SIGNED:    Worthy Heads, FNP        Please pardon any potential grammatical errors or typos as aspects of this note may have been created through speech-to-text software.

## 2024-01-04 ENCOUNTER — Telehealth (INDEPENDENT_AMBULATORY_CARE_PROVIDER_SITE_OTHER): Payer: Self-pay

## 2024-01-04 DIAGNOSIS — R531 Weakness: Secondary | ICD-10-CM

## 2024-01-04 DIAGNOSIS — Z9181 History of falling: Secondary | ICD-10-CM

## 2024-01-04 DIAGNOSIS — R5381 Other malaise: Secondary | ICD-10-CM

## 2024-01-04 DIAGNOSIS — R2689 Other abnormalities of gait and mobility: Secondary | ICD-10-CM

## 2024-01-04 NOTE — Telephone Encounter (Signed)
 Referral Request    Specialist Name:[facility]  Location:[address] [place], [place] [address] but will be coming to his home for PT  Reason for Referral/Diagnosis:strengthening and balance issues   Phone [phone]  Fax [phone]    Appointment Date:tbd  Additional Notes:

## 2024-01-05 ENCOUNTER — Encounter (INDEPENDENT_AMBULATORY_CARE_PROVIDER_SITE_OTHER): Payer: Self-pay

## 2024-01-05 NOTE — Telephone Encounter (Signed)
 Notified per Maggie-mychart msg

## 2024-01-05 NOTE — Telephone Encounter (Signed)
 PT order placed, please notify

## 2024-01-07 ENCOUNTER — Telehealth (INDEPENDENT_AMBULATORY_CARE_PROVIDER_SITE_OTHER): Payer: Self-pay

## 2024-01-07 NOTE — Telephone Encounter (Signed)
 Incoming Fax     Date of Fax: 01/05/24  Document From: [facility]  Order Number: [id number]  Provider: NP Healey    Please see/ complete incoming fax and place in blue file folder for task completion.

## 2024-01-08 NOTE — Telephone Encounter (Signed)
 Form signed and placed in my completion bin. Ready to be faxed back.

## 2024-01-10 NOTE — Telephone Encounter (Signed)
 Faxed to [facility] on fax # [phone]     WHAT WAS FAXED:  - Order # [id number]     Confirmation Sheet - Success on 01/10/2024 at 332 hrs    Placed confirmation sheet in the scan bin on Team B side.

## 2024-01-11 ENCOUNTER — Encounter (INDEPENDENT_AMBULATORY_CARE_PROVIDER_SITE_OTHER): Payer: Self-pay

## 2024-01-18 ENCOUNTER — Telehealth (INDEPENDENT_AMBULATORY_CARE_PROVIDER_SITE_OTHER): Payer: Self-pay

## 2024-01-18 NOTE — Telephone Encounter (Signed)
 Incoming Fax     Date of Fax: 01/18/2024  Document From: [facility]  Therapy evaluation request   Provider: NP Healey     Please complete incoming fax from blue folder and place back in black folder for task completion.

## 2024-01-19 ENCOUNTER — Telehealth (INDEPENDENT_AMBULATORY_CARE_PROVIDER_SITE_OTHER): Payer: Self-pay

## 2024-01-19 DIAGNOSIS — R531 Weakness: Secondary | ICD-10-CM

## 2024-01-19 DIAGNOSIS — R2689 Other abnormalities of gait and mobility: Secondary | ICD-10-CM

## 2024-01-19 DIAGNOSIS — R5381 Other malaise: Secondary | ICD-10-CM

## 2024-01-19 DIAGNOSIS — Z9181 History of falling: Secondary | ICD-10-CM

## 2024-01-19 DIAGNOSIS — G629 Polyneuropathy, unspecified: Secondary | ICD-10-CM

## 2024-01-19 NOTE — Telephone Encounter (Signed)
 [employer] is calling states that pt's wife called them and said that [facility] is not allowing scheduling. [employer] states that they need a prior auth to switch for the [facility]. Please advise.

## 2024-01-20 NOTE — Telephone Encounter (Signed)
 I do not handle PT prior auths.     NP Healey - not sure if the order needs to be changed?    Maggie - please advise on [facility] prior authorization.

## 2024-01-21 NOTE — Telephone Encounter (Signed)
 Faxed to [facility] on fax # [phone]     WHAT WAS FAXED:  Therapy evaluation request     Confirmation Sheet - OK on 01/21/2024 at 947 hrs    Placed confirmation sheet in the scan bin on Team B side.

## 2024-01-21 NOTE — Telephone Encounter (Signed)
 Form signed and placed in my completed bin. Ready to be faxed back.

## 2024-01-26 NOTE — Telephone Encounter (Signed)
 Please inform patient's wife PT order has been placed

## 2024-01-26 NOTE — Telephone Encounter (Signed)
 Can we please check with patient's wife- do they need a home health PT order? If so, who do they want it sent to?  OR, do they need an outpatient PT order?

## 2024-01-26 NOTE — Addendum Note (Signed)
 Addended by: JON DUWAINE SAILOR on: 01/26/2024 06:36 PM     Modules accepted: Orders

## 2024-01-27 ENCOUNTER — Encounter (INDEPENDENT_AMBULATORY_CARE_PROVIDER_SITE_OTHER): Payer: Self-pay

## 2024-02-02 NOTE — Telephone Encounter (Signed)
 First Attempt - Called patient's wife at home/mobile [phone]). No answer. Left a detailed voicemail requesting a call back to review recent PT order request. Provided office number: [phone].

## 2024-02-03 NOTE — Telephone Encounter (Signed)
 Pts wife informed.

## 2024-02-07 ENCOUNTER — Telehealth (INDEPENDENT_AMBULATORY_CARE_PROVIDER_SITE_OTHER): Payer: Self-pay

## 2024-02-07 NOTE — Telephone Encounter (Signed)
 WORK-IN REQUEST - GENERAL    Reason for Work-In:   Blood in urine   Preferred Provider:   Anyone   Preferred Date/Time Frame:   Today   Additional Notes:   Patient has been experiencing blood in urine for several days, per wife. Patient is scheduled for 6/4 and is requesting to be seen sooner.        Recent Visits  Date Type Provider Dept   12/20/23 Office Visit Jon Duwaine SAILOR, FNP [facility]   07/23/23 Office Visit Jon Duwaine SAILOR, FNP [facility]   Showing recent visits within past 270 days and meeting all other requirements  Future Appointments  No visits were found meeting these conditions.  Showing future appointments within next 90 days and meeting all other requirements

## 2024-02-09 ENCOUNTER — Ambulatory Visit (INDEPENDENT_AMBULATORY_CARE_PROVIDER_SITE_OTHER)

## 2024-02-09 NOTE — Telephone Encounter (Signed)
 I cannot work him in. He can try to see another provider in our office if anyone has availability. Otherwise I recommend Urgent Care.

## 2024-02-10 NOTE — Telephone Encounter (Signed)
Unable to work in, recommend urgent care

## 2024-02-10 NOTE — Telephone Encounter (Signed)
 Work in request, please see below

## 2024-02-10 NOTE — Telephone Encounter (Signed)
 Pt was seen by his urologist. No longer needs an appt

## 2024-03-13 ENCOUNTER — Ambulatory Visit (INDEPENDENT_AMBULATORY_CARE_PROVIDER_SITE_OTHER)

## 2024-03-13 ENCOUNTER — Encounter (INDEPENDENT_AMBULATORY_CARE_PROVIDER_SITE_OTHER): Payer: Self-pay

## 2024-03-13 VITALS — BP 142/72 | HR 78 | Temp 98.9°F | Ht 70.0 in | Wt 150.0 lb

## 2024-03-13 DIAGNOSIS — L729 Follicular cyst of the skin and subcutaneous tissue, unspecified: Secondary | ICD-10-CM

## 2024-03-13 DIAGNOSIS — Z87440 Personal history of urinary (tract) infections: Secondary | ICD-10-CM

## 2024-03-13 DIAGNOSIS — L089 Local infection of the skin and subcutaneous tissue, unspecified: Secondary | ICD-10-CM

## 2024-03-13 DIAGNOSIS — R399 Unspecified symptoms and signs involving the genitourinary system: Secondary | ICD-10-CM

## 2024-03-13 LAB — POCT URINALYSIS AUTOMATED (IAH)
Bilirubin, UA POCT: NEGATIVE
Glucose, UA POCT: NEGATIVE
Ketones, UA POCT: NEGATIVE mg/dL
Nitrite, UA POCT: NEGATIVE
PH, UA POCT: 6
Protein, UA POCT: POSITIVE mg/dL — AB
Specific Gravity, UA POCT: 1.015 mg/dL
Urobilinogen, UA POCT: 0.2 mg/dL

## 2024-03-13 MED ORDER — SULFAMETHOXAZOLE-TRIMETHOPRIM 800-160 MG PO TABS
1.0000 | ORAL_TABLET | Freq: Two times a day (BID) | ORAL | 0 refills | Status: AC
Start: 2024-03-13 — End: 2024-03-27

## 2024-03-13 NOTE — Progress Notes (Signed)
 [facility]                     Date of Virtual Visit: 03/13/2024 3:08 PM        Patient ID: Brian Pena is a 78 y.o. male.  Attending Physician: Duwaine LOISE Shropshire, FNP          Chief Complaint:    Chief Complaint   Patient presents with    Cyst     Symptoms: cyst on back   Onset: 1 week   What have you tried?:  OTC medications (if any)?: advil   Any other concerns?: UTI                  HPI:    78 yr male presenting with a cyst on his back and urinary concerns    Has had a cyst on his back for years, however, it has become inflamed and painful over past 1-2 wks   There is also purulent drainage   He experiences significant pain when his wife was pressing on the area trying to drain it  Has taken advil     UTI 1 wk ago - was put on ceftin    Finished antibiotics yesterday   However, he continues with urinary frequency   No fevers   Urology follow up on 03/24/24   Has had 3 UTIs recently               Problem List:    Problem List[1]          Current Meds:    Medications Taking[2]       Allergies:    Allergies[3]          Past Surgical History:    Past Surgical History[4]        Family History:    Family History[5]        Social History:    Social History[6]        The following sections were reviewed this encounter by the provider:   Tobacco  Allergies  Meds  Problems  Med Hx  Surg Hx  Fam Hx             Vital Signs:    BP 142/72 (BP Site: Left arm, Patient Position: Sitting, Cuff Size: Medium)   Pulse 78   Temp 98.9 F (37.2 C) (Tympanic)   Ht 1.778 m (5' 10)   Wt 68 kg (150 lb)   SpO2 98%   BMI 21.52 kg/m          ROS:    Review of Systems   As per HPI        Physical Exam:    Physical Exam  Vitals and nursing note reviewed.   Constitutional:       General: He is not in acute distress.     Appearance: Normal appearance. He is not ill-appearing, toxic-appearing or diaphoretic.   HENT:      Head: Normocephalic.     Cardiovascular:      Rate and Rhythm: Normal rate.   Pulmonary:       Effort: Pulmonary effort is normal. No respiratory distress.     Skin:     General: Skin is warm and dry.      Findings: Abscess present.           Comments: Approx 2-3 cm non fluctuant abscess on left upper-mid back. Bloody and purulent drainage visualized at center of lesion.  Neurological:      Mental Status: He is alert and oriented to person, place, and time.     Psychiatric:         Mood and Affect: Mood normal.         Behavior: Behavior normal.        Recent Results (from the past week)   POCT UA Clinitek AX (urine dipstick)    Collection Time: 03/13/24  2:14 PM   Result Value Ref Range    Urine Color POCT Yellow     Urine Clarity POCT Cloudy     Glucose, UA POCT Negative Negative    Bilirubin, UA POCT Negative Negative    Ketones, UA POCT Negative Negative mg/dL    Specific Gravity, UA POCT 1.015 mg/dL    Blood, UA POCT  Small (A) Negative    PH, UA POCT 6.0     Protein, UA POCT Positive (A) Negative mg/dL    Urobilinogen, UA POCT 0.2 0.2, 1.0 mg/dL    Nitrite, UA POCT Negative Negative    Urine Leukocytes POCT Moderate (A) Negative             Assessment:    1. Infected cyst of skin  - sulfamethoxazole -trimethoprim  (BACTRIM  DS) 800-160 MG per tablet; Take 1 tablet by mouth 2 (two) times daily for 14 days  Dispense: 28 tablet; Refill: 0  - Culture + Gram Stain,Aerobic, Wound; Future  - Culture + Gram Stain,Aerobic, Wound    2. UTI symptoms  - POCT UA Clinitek AX (urine dipstick); Future  - POCT UA Clinitek AX (urine dipstick)  - Culture, Urine; Future  - Culture, Urine    3. History of UTI            Plan:    Wound culture collected   Deferred I&D given lesion is non fluctuant and actively draining   Will initiate treatment with bactrim  to cover for MRSA, take twice daily x 2 wks with food   Strict return precautions discussed  Monitor closely for worsening infection  Consider dermatology follow up  Follow up in 10-14 days, sooner PRN     UA dipstick with 3+ leukocytes and small blood. Will send  for urine culture.   Bactrim  will hopefully cover for urinary tract infection as well, will adjust if needed pending C&S  Push water   Follow up with Urology        Follow-up:    No follow-ups on file.         Duwaine LOISE Shropshire, FNP              [1]   Patient Active Problem List  Diagnosis    Type 2 diabetes mellitus with diabetic polyneuropathy, without long-term current use of insulin (CMS/HCC)    Paroxysmal atrial fibrillation (CMS/HCC)    Essential hypertension    Mixed hyperlipidemia    Cellulitis of right lower limb   [2]   Outpatient Medications Marked as Taking for the 03/13/24 encounter (Office Visit) with Shropshire Duwaine LOISE, FNP   Medication Sig Dispense Refill    Accu-Chek Softclix Lancets lancets Use as directed to check blood glucose 1-2 times daily. 100 each 3    acetaminophen (TYLENOL) 500 MG tablet Take 1 tablet (500 mg) by mouth every 6 (six) hours as needed for Pain      amoxicillin  (AMOXIL ) 500 MG capsule       apixaban  (Eliquis ) 2.5 MG Take 1 tablet (2.5 mg) by mouth every 12 (twelve) hours  180 tablet 1    Blood Glucose Monitoring Suppl (Accu-Chek Aviva Plus) w/Device Kit Use as Directed 1 kit 1    CALCIUM  PO Take 1,000 mg by mouth daily      Continuous Glucose Receiver (Dexcom G7 Receiver) Device Use as directed as per manufacturer 1 each 0    Continuous Glucose Sensor (Dexcom G7 Sensor) Misc Inject 1 Device into the skin every 10 (ten) days Use as directed per manufacturer 9 each 3    finasteride (PROSCAR) 5 MG tablet Take 1 tablet (5 mg) by mouth daily      glucose blood test strip Use as directed to check blood glucose 1-2 times daily 100 each 3    HYDROcodone-acetaminophen (NORCO) 5-325 MG per tablet Take 1 tablet by mouth every 6 (six) hours as needed      metFORMIN  (GLUCOPHAGE ) 1000 MG tablet Take 1 tablet (1,000 mg) by mouth 2 (two) times daily 180 tablet 1    metoprolol  succinate XL (TOPROL -XL) 25 MG 24 hr tablet Take 1 tablet (25 mg) by mouth daily 90 tablet 3    rosuvastatin  (CRESTOR ) 5 MG  tablet Take 1 tablet (5 mg) by mouth daily 90 tablet 1    tamsulosin (FLOMAX) 0.4 MG Cap Take 1 capsule (0.4 mg) by mouth 2 (two) times daily      traZODone  (DESYREL ) 50 MG tablet Take 3 tablets (150 mg) by mouth once nightly 270 tablet 0   [3]   Allergies  Allergen Reactions    Glimepiride       Went into diabetic shock    Linezolid Nausea Only, Other (See Comments) and Anaphylaxis     weakness  Decreased appetite, nausea, fatigue.  Decreased appetite, nausea, fatigue.      Gabapentin Clouded Consciousness   [4]   Past Surgical History:  Procedure Laterality Date    BACK SURGERY  2017    scoliosis and stenosis - 3 surgeries total    CERVICAL SPINE SURGERY  2019    KNEE SURGERY Left 10/08/1978    medial meniscectomy   [5]   Family History  Problem Relation Name Age of Onset    Diabetes Mother      Heart disease Father      Stroke Father      Diabetes Father     [6]   Social History  Tobacco Use    Smoking status: Former     Current packs/day: 0.00     Average packs/day: 0.5 packs/day for 50.0 years (25.0 ttl pk-yrs)     Types: Cigarettes     Start date: 1964     Quit date: 2014     Years since quitting: 11.5    Smokeless tobacco: Never   Vaping Use    Vaping status: Never Used   Substance Use Topics    Alcohol use: Not Currently     Alcohol/week: 0.0 standard drinks of alcohol    Drug use: Yes     Comment: cannabis gummies for pain

## 2024-03-16 ENCOUNTER — Ambulatory Visit (INDEPENDENT_AMBULATORY_CARE_PROVIDER_SITE_OTHER): Payer: Self-pay

## 2024-03-16 LAB — CULTURE, URINE

## 2024-03-18 LAB — CULTURE + GRAM STAIN,AEROBIC, WOUND

## 2024-03-20 ENCOUNTER — Ambulatory Visit (INDEPENDENT_AMBULATORY_CARE_PROVIDER_SITE_OTHER): Payer: Self-pay

## 2024-03-24 ENCOUNTER — Other Ambulatory Visit (INDEPENDENT_AMBULATORY_CARE_PROVIDER_SITE_OTHER): Payer: Self-pay

## 2024-03-24 DIAGNOSIS — E782 Mixed hyperlipidemia: Secondary | ICD-10-CM

## 2024-03-24 DIAGNOSIS — E1142 Type 2 diabetes mellitus with diabetic polyneuropathy: Secondary | ICD-10-CM

## 2024-03-27 ENCOUNTER — Ambulatory Visit (INDEPENDENT_AMBULATORY_CARE_PROVIDER_SITE_OTHER)

## 2024-04-18 ENCOUNTER — Encounter (INDEPENDENT_AMBULATORY_CARE_PROVIDER_SITE_OTHER): Payer: Self-pay

## 2024-04-18 ENCOUNTER — Telehealth (INDEPENDENT_AMBULATORY_CARE_PROVIDER_SITE_OTHER): Payer: Self-pay

## 2024-04-18 DIAGNOSIS — F5101 Primary insomnia: Secondary | ICD-10-CM

## 2024-04-18 DIAGNOSIS — R269 Unspecified abnormalities of gait and mobility: Secondary | ICD-10-CM

## 2024-04-18 DIAGNOSIS — R2689 Other abnormalities of gait and mobility: Secondary | ICD-10-CM

## 2024-04-18 DIAGNOSIS — R531 Weakness: Secondary | ICD-10-CM

## 2024-04-18 DIAGNOSIS — R296 Repeated falls: Secondary | ICD-10-CM

## 2024-04-18 NOTE — Telephone Encounter (Addendum)
 Pt is calling in to request an rx refill for:     traZODone  (DESYREL ) 50 MG tablet (Order [id number])     2.    3.      Days of medication left [0 of days ]       Please send rx to   [facility] - [place],  - [address]      [ Additional information ] home health nurse states pt is really depressed. Pls assess and send in Rx.Pt wife is available to discuss options if needed. Pls call her at [phone]        Please assess and call back with update          Recent Visits  Date Type Provider Dept   03/13/24 Office Visit Jon Duwaine SAILOR, FNP [facility]   12/20/23 Office Visit Jon Duwaine SAILOR, FNP [facility]   07/23/23 Office Visit Jon Duwaine SAILOR, FNP [facility]   Showing recent visits within past 270 days and meeting all other requirements  Future Appointments  No visits were found meeting these conditions.  Showing future appointments within next 90 days and meeting all other requirements

## 2024-04-19 MED ORDER — TRAZODONE HCL 50 MG PO TABS: 150.0000 mg | ORAL_TABLET | Freq: Every evening | ORAL | 0 refills | Status: DC

## 2024-04-19 NOTE — Telephone Encounter (Signed)
 Pt declined to schedule a visit stating he's fine

## 2024-04-19 NOTE — Telephone Encounter (Signed)
 Trazodone  refill sent   This is for insomnia and makes the patient drowsy.     If he is experiencing worsening depression, id recommend he be seen to discuss other medication options or treatments to better target depression.

## 2024-04-22 ENCOUNTER — Encounter (INDEPENDENT_AMBULATORY_CARE_PROVIDER_SITE_OTHER): Payer: Self-pay

## 2024-04-22 LAB — BASIC METABOLIC PANEL
BUN / Creatinine Ratio: 28 — ABNORMAL HIGH (ref 10–24)
BUN: 27 mg/dL (ref 8–27)
CO2: 23 mmol/L (ref 20–29)
Calcium: 10 mg/dL (ref 8.6–10.2)
Chloride: 97 mmol/L (ref 96–106)
Creatinine: 0.95 mg/dL (ref 0.76–1.27)
Glucose: 109 mg/dL — ABNORMAL HIGH (ref 70–99)
Potassium: 4.1 mmol/L (ref 3.5–5.2)
Sodium: 135 mmol/L (ref 134–144)
eGFR: 81 mL/min/1.73 (ref >59)

## 2024-04-24 NOTE — Progress Notes (Unsigned)
 Pt's wife calling to check the status of his request.She says he has lost control of his balance. She said they prefer a call back because they have trouble using mychart.[email or web address]

## 2024-04-25 NOTE — Progress Notes (Signed)
 Pt wife is calling states that pt needs referral to see neurologist. Pt insurance with us  OON until 9/1 pt wife wants to have some kind of response about what to do. Says waiting time for an appt with spec is around a month and not sure it we can assist with that as well once referral come is. She is concerned and wants have pt seen asap.

## 2024-04-25 NOTE — Progress Notes (Signed)
 I have placed a referral to [facility]. Can place a referral for a different neurologist if desired.     If patient acutely worsens, recommend [facility] (home urgent care) or going to ER.   [email or web address]      For blood in urine, would follow up with Urology.

## 2024-04-25 NOTE — Progress Notes (Signed)
 Spoke to pt's wife and informed her referral was ready. She stated she will call back with name of Dr. ODESSIA date of appt so we can fax neurology referral to specialist office

## 2024-04-27 ENCOUNTER — Encounter (INDEPENDENT_AMBULATORY_CARE_PROVIDER_SITE_OTHER): Payer: Self-pay

## 2024-04-28 ENCOUNTER — Ambulatory Visit (INDEPENDENT_AMBULATORY_CARE_PROVIDER_SITE_OTHER): Payer: Self-pay

## 2024-04-28 ENCOUNTER — Other Ambulatory Visit: Payer: Self-pay | Admitting: Family

## 2024-04-28 DIAGNOSIS — N390 Urinary tract infection, site not specified: Secondary | ICD-10-CM

## 2024-05-01 ENCOUNTER — Ambulatory Visit
Admission: RE | Admit: 2024-05-01 | Discharge: 2024-05-01 | Disposition: A | Discharge status: Home / Self Care | Source: Ambulatory Visit | Attending: Family | Admitting: Family

## 2024-05-01 DIAGNOSIS — N390 Urinary tract infection, site not specified: Secondary | ICD-10-CM | POA: Insufficient documentation

## 2024-05-03 ENCOUNTER — Telehealth (INDEPENDENT_AMBULATORY_CARE_PROVIDER_SITE_OTHER): Payer: Self-pay

## 2024-05-03 NOTE — Telephone Encounter (Signed)
 Incoming Fax     Date of Fax: 04/20/2024  Document From: [facility] PT  PT plan of care- initial eval 04/13/2024-07/05/2024  Provider: NP Jon    Please see/ complete incoming fax and place in appropriate file folder for task completion.

## 2024-05-04 NOTE — Telephone Encounter (Signed)
 Signed and placed in my completion bin, ready to fax back

## 2024-05-05 NOTE — Telephone Encounter (Signed)
 Faxed to [facility] on fax # [phone]     WHAT WAS FAXED:  - PT POC - Initial Eval    Confirmation Sheet - OK on 05/05/24 at 1258 hrs    Placed confirmation sheet in the scan bin.

## 2024-05-09 ENCOUNTER — Ambulatory Visit (INDEPENDENT_AMBULATORY_CARE_PROVIDER_SITE_OTHER)

## 2024-05-09 ENCOUNTER — Encounter (INDEPENDENT_AMBULATORY_CARE_PROVIDER_SITE_OTHER): Payer: Self-pay

## 2024-05-09 VITALS — BP 138/82 | HR 81 | Temp 97.8°F | Resp 18 | Ht 79.0 in | Wt 146.0 lb

## 2024-05-09 DIAGNOSIS — R200 Anesthesia of skin: Secondary | ICD-10-CM

## 2024-05-09 DIAGNOSIS — G629 Polyneuropathy, unspecified: Secondary | ICD-10-CM

## 2024-05-09 DIAGNOSIS — F40240 Claustrophobia: Secondary | ICD-10-CM

## 2024-05-09 DIAGNOSIS — R531 Weakness: Secondary | ICD-10-CM

## 2024-05-09 DIAGNOSIS — R269 Unspecified abnormalities of gait and mobility: Secondary | ICD-10-CM

## 2024-05-09 DIAGNOSIS — R2689 Other abnormalities of gait and mobility: Secondary | ICD-10-CM

## 2024-05-09 DIAGNOSIS — R296 Repeated falls: Secondary | ICD-10-CM

## 2024-05-09 MED ORDER — LORAZEPAM 0.5 MG PO TABS
ORAL_TABLET | ORAL | 0 refills | Status: DC
Start: 2024-05-09 — End: 2024-07-04

## 2024-05-09 NOTE — Progress Notes (Signed)
 [facility]  [facility]ing [facility]                     Date of Exam: 05/09/2024 4:40 PM        Patient ID: Brian Pena is a 78 y.o. male.  Attending Physician: Duwaine LOISE Shropshire, FNP          Chief Complaint:    Chief Complaint   Patient presents with    referral     For a nerologist  He keeps falling and fallen just this morning               HPI:    78 yr old male presenting for increasing falls   Seen with his wife     He has known hx of diabetic neuropathy  He also has hx of spinal surgeries > 1yrs ago   Pt has been having progressive worsening of his neuropathy and increase in falls over past 4-5 months   Has numbness in bilateral feet   He has issues getting his feet to do what he wants, leading to falls   He has no balance  Worsening lower extremity weakness despite regular PT   Has been doing in home physical therapy 3-5 times per week for months without improvement  Takes Eliquis  for hx of a fib   It is getting increasingly difficult to get him up after a fall   He does not feel dizzy or lightheaded  They were able to get an appt with [facility] [place] on 06/01/24 at 11 am but are wondering if there is anyway to have him seen sooner.     He has also been seeing Urology for OAB and recurrent UTI   Had trialed OAB meds but no longer on these   Now on flomax BID for BPH   Had KUB ultrasound, ordered by urology   Continues with urinary frequency   Using urinal at night so that he does not need to get up               Problem List:    Problem List[1]          Current Meds:    Medications Taking[2]       Allergies:    Allergies[3]          Past Surgical History:    Past Surgical History[4]        Family History:    Family History[5]        Social History:    Social History[6]        The following sections were reviewed this encounter by the provider:   Tobacco  Allergies  Meds  Problems  Med Hx  Surg Hx  Fam Hx             Vital Signs:    BP 138/82   Pulse 81    Temp 97.8 F (36.6 C)   Resp 18   Ht 2.007 m (6' 7)   Wt 66.2 kg (146 lb)   SpO2 97%   BMI 16.45 kg/m          ROS:    Review of Systems           Physical Exam:    Physical Exam  Vitals and nursing note reviewed.   Constitutional:       General: He is not in acute distress.     Appearance: Normal appearance. He  is not ill-appearing, toxic-appearing or diaphoretic.   HENT:      Head: Normocephalic.   Cardiovascular:      Rate and Rhythm: Normal rate.   Pulmonary:      Effort: Pulmonary effort is normal. No respiratory distress.   Musculoskeletal:      Right lower leg: No edema.      Left lower leg: No edema.      Comments: Decreased strength of bilateral lower extremities 4/5 bilaterally. Decreased sensation of bilateral feet with light touch.    Neurological:      Mental Status: He is alert and oriented to person, place, and time.              Assessment:    1. Balance problem  - MRI Brain W WO Contrast; Future  - MRI Lumbar Spine W WO Contrast; Future  - MRI Thoracic Spine W WO Contrast; Future  - MRI Cervical Spine W WO Contrast; Future  - MRI Brain W WO Contrast  - MRI Lumbar Spine W WO Contrast  - MRI Thoracic Spine W WO Contrast  - MRI Cervical Spine W WO Contrast    2. Generalized weakness  - MRI Brain W WO Contrast; Future  - MRI Lumbar Spine W WO Contrast; Future  - MRI Thoracic Spine W WO Contrast; Future  - MRI Cervical Spine W WO Contrast; Future  - MRI Brain W WO Contrast  - MRI Lumbar Spine W WO Contrast  - MRI Thoracic Spine W WO Contrast  - MRI Cervical Spine W WO Contrast    3. Frequent falls  - MRI Brain W WO Contrast; Future  - MRI Lumbar Spine W WO Contrast; Future  - MRI Thoracic Spine W WO Contrast; Future  - MRI Cervical Spine W WO Contrast; Future  - MRI Brain W WO Contrast  - MRI Lumbar Spine W WO Contrast  - MRI Thoracic Spine W WO Contrast  - MRI Cervical Spine W WO Contrast    4. Gait abnormality  - MRI Brain W WO Contrast; Future  - MRI Lumbar Spine W WO Contrast; Future  - MRI  Thoracic Spine W WO Contrast; Future  - MRI Cervical Spine W WO Contrast; Future  - MRI Brain W WO Contrast  - MRI Lumbar Spine W WO Contrast  - MRI Thoracic Spine W WO Contrast  - MRI Cervical Spine W WO Contrast    5. Neuropathy  - MRI Brain W WO Contrast; Future  - MRI Lumbar Spine W WO Contrast; Future  - MRI Thoracic Spine W WO Contrast; Future  - MRI Cervical Spine W WO Contrast; Future  - MRI Brain W WO Contrast  - MRI Lumbar Spine W WO Contrast  - MRI Thoracic Spine W WO Contrast  - MRI Cervical Spine W WO Contrast    6. Numbness of feet  - MRI Brain W WO Contrast; Future  - MRI Lumbar Spine W WO Contrast; Future  - MRI Thoracic Spine W WO Contrast; Future  - MRI Cervical Spine W WO Contrast; Future  - MRI Brain W WO Contrast  - MRI Lumbar Spine W WO Contrast  - MRI Thoracic Spine W WO Contrast  - MRI Cervical Spine W WO Contrast    7. Claustrophobia  - LORazepam  (ATIVAN ) 0.5 MG tablet; Take 1 tablet by mouth 30-90 minutes prior to procedure  Dispense: 2 tablet; Refill: 0            Plan:  Will get urgent MRI head and spine, will have our team try and assist in MRI appt scheduling. They are flexible with scheduling, no blackout dates.   Lorazepam  rx sent to take for claustrophobia associated with MRI, discussed proper usage and risks. PMP reviewed. Use wheelchair for mobility after taking, use extra caution to prevent falls.   Keep current Neurology appt on 09/25, unless we are able to secure a sooner appt  Will continue with in home PT   Will continue with home health  Continue with use of assistive devices   Continue to f/u with Urology     F/u pending imaging           Follow-up:    No follow-ups on file.         Duwaine LOISE Shropshire, FNP            [1]   Patient Active Problem List  Diagnosis    Type 2 diabetes mellitus with diabetic polyneuropathy, without long-term current use of insulin (CMS/HCC)    Paroxysmal atrial fibrillation (CMS/HCC)    Essential hypertension    Mixed hyperlipidemia    Cellulitis of  right lower limb   [2]   Outpatient Medications Marked as Taking for the 05/09/24 encounter (Office Visit) with Sharise Lippy N, FNP   Medication Sig Dispense Refill    acetaminophen (TYLENOL) 500 MG tablet Take 1 tablet (500 mg) by mouth every 6 (six) hours as needed for Pain      apixaban  (Eliquis ) 2.5 MG tablet Take 1 tablet (2.5 mg) by mouth      CALCIUM  PO Take 1,000 mg by mouth daily      Continuous Glucose Receiver (Dexcom G7 Receiver) Device Use as directed as per manufacturer 1 each 0    Continuous Glucose Sensor (Dexcom G7 Sensor) Misc Inject 1 Device into the skin every 10 (ten) days Use as directed per manufacturer 9 each 3    finasteride (PROSCAR) 5 MG tablet Take 1 tablet (5 mg) by mouth daily      metFORMIN  (GLUCOPHAGE ) 1000 MG tablet TAKE 1 TABLET(1000 MG) BY MOUTH TWICE DAILY 180 tablet 0    metoprolol  succinate XL (TOPROL -XL) 25 MG 24 hr tablet Take 1 tablet (25 mg) by mouth daily 90 tablet 3    rosuvastatin  (CRESTOR ) 5 MG tablet TAKE 1 TABLET(5 MG) BY MOUTH DAILY 90 tablet 0    tamsulosin (FLOMAX) 0.4 MG Cap Take 1 capsule (0.4 mg) by mouth 2 (two) times daily      traZODone  (DESYREL ) 50 MG tablet Take 3 tablets (150 mg) by mouth once at bedtime 270 tablet 0    [DISCONTINUED] Mirabegron ER 25 MG Tablet SR 24 hr Take 1 tablet (25 mg) by mouth once daily     [3]   Allergies  Allergen Reactions    Glimepiride       Went into diabetic shock    Linezolid Nausea Only, Other (See Comments) and Anaphylaxis     weakness  Decreased appetite, nausea, fatigue.  Decreased appetite, nausea, fatigue.      Gabapentin Clouded Consciousness   [4]   Past Surgical History:  Procedure Laterality Date    BACK SURGERY  2017    scoliosis and stenosis - 3 surgeries total    CERVICAL SPINE SURGERY  2019    KNEE SURGERY Left 10/08/1978    medial meniscectomy   [5]   Family History  Problem Relation Name Age of Onset    Diabetes Mother  Heart disease Father      Stroke Father      Diabetes Father     [6]   Social  History  Tobacco Use    Smoking status: Former     Current packs/day: 0.00     Average packs/day: 0.5 packs/day for 50.0 years (25.0 ttl pk-yrs)     Types: Cigarettes     Start date: 1964     Quit date: 2014     Years since quitting: 11.6    Smokeless tobacco: Never   Vaping Use    Vaping status: Never Used   Substance Use Topics    Alcohol use: Not Currently     Alcohol/week: 0.0 standard drinks of alcohol    Drug use: Yes     Comment: cannabis gummies for pain

## 2024-05-10 ENCOUNTER — Encounter (INDEPENDENT_AMBULATORY_CARE_PROVIDER_SITE_OTHER): Payer: Self-pay

## 2024-05-15 ENCOUNTER — Encounter (INDEPENDENT_AMBULATORY_CARE_PROVIDER_SITE_OTHER): Payer: Self-pay

## 2024-05-21 ENCOUNTER — Other Ambulatory Visit (INDEPENDENT_AMBULATORY_CARE_PROVIDER_SITE_OTHER): Payer: Self-pay

## 2024-05-21 DIAGNOSIS — E162 Hypoglycemia, unspecified: Secondary | ICD-10-CM

## 2024-05-21 DIAGNOSIS — E11649 Type 2 diabetes mellitus with hypoglycemia without coma: Secondary | ICD-10-CM

## 2024-05-21 DIAGNOSIS — E1142 Type 2 diabetes mellitus with diabetic polyneuropathy: Secondary | ICD-10-CM

## 2024-05-29 ENCOUNTER — Encounter (INDEPENDENT_AMBULATORY_CARE_PROVIDER_SITE_OTHER): Payer: Self-pay

## 2024-06-05 ENCOUNTER — Encounter (INDEPENDENT_AMBULATORY_CARE_PROVIDER_SITE_OTHER): Payer: Self-pay

## 2024-06-05 ENCOUNTER — Telehealth (INDEPENDENT_AMBULATORY_CARE_PROVIDER_SITE_OTHER): Payer: Self-pay

## 2024-06-05 NOTE — Telephone Encounter (Signed)
 Pt's wife called, she says somebody with NP [facility] team (she does not remember who she was spekaing with)  has assisted in scheduling an MRI for him. She says it was scheduled for 06/10/24, but she does not know where it is scheduled or what time it is.     She also says somebody was supposed to prescribed a medication for him prior to his MRI because he has anxiety when he gets MRI's. She wants to know if the medication has been filled?

## 2024-06-05 NOTE — Telephone Encounter (Addendum)
 Contacted [facility] to confirm appt details for 10/4- pt is scheduled with [facility] [address], [address], [place], [place] [address] @ 9:15 am. Pt must arrive at 9a, with photo ID and insurance card. NP Healey sent rx for lorazepam  on 9/2- I contacted [facility] to confirm they received the rx. They had put the rx on hold but will process for pick up and contact pt when ready. Left msg for [name] with the information requested. Will send mychart msg as well-no further follow up needed

## 2024-06-09 ENCOUNTER — Encounter (INDEPENDENT_AMBULATORY_CARE_PROVIDER_SITE_OTHER): Payer: Self-pay | Admitting: Cardiovascular Disease

## 2024-06-09 ENCOUNTER — Ambulatory Visit (INDEPENDENT_AMBULATORY_CARE_PROVIDER_SITE_OTHER): Admitting: Cardiovascular Disease

## 2024-06-09 VITALS — BP 163/96 | HR 70 | Ht 70.0 in | Wt 144.0 lb

## 2024-06-09 DIAGNOSIS — E782 Mixed hyperlipidemia: Secondary | ICD-10-CM

## 2024-06-09 DIAGNOSIS — I4819 Other persistent atrial fibrillation: Secondary | ICD-10-CM

## 2024-06-09 DIAGNOSIS — I10 Essential (primary) hypertension: Secondary | ICD-10-CM

## 2024-06-09 NOTE — Progress Notes (Signed)
 Brian Pena  HEART CARDIOLOGY OFFICE PROGRESS NOTE    HRT [facility] OFFICE  Brian Pena  HEART [facility] OFFICE -CARDIOLOGY  [address]  [place] [address]  Dept: [phone]  Dept Fax: [phone]       Patient Name: Brian Pena, Brian Pena    Date of Visit:  June 09, 2024  Date of Birth: 04/17/1946  AGE: 78 y.o.  Medical Record #: [id number]  Requesting Physician: Duwaine LOISE Shropshire, FNP      CHIEF COMPLAINT: Essential hypertension      HISTORY OF PRESENT ILLNESS:    He is a pleasant 78 y.o. male who presents today for a follow-up visit.  Last seen by me in February 2024.  Last seen in our office and April 2025.  That visit was following an admission to [facility] after being admitted for dehydration and a UTI.  For the most part, from a cardiac standpoint, he has done well.  Heart rates are not elevated at home, blood pressure typically 130s.  He remains on his low-dose Eliquis  with no adverse bleeding or bruising.  His biggest issue is his progressively worsening peripheral neuropathy, involving both his hands and his feet.  He has been evaluated by neurologist and has an MRI scheduled for tomorrow.  His functional status is quite limited by the neuropathy, as well as his inability to walk.      PAST MEDICAL HISTORY: He has a past medical history of Anemia, Basal cell carcinoma of skin (09/07/2002), Benign prostatic hyperplasia with urinary obstruction (08/05/2016), Cardiomyopathy (CMS/HCC) (05/03/2012), Cerebrovascular accident (CMS/HCC), Current moderate episode of major depressive disorder without prior episode (CMS/HCC) (01/11/2022), DDD (degenerative disc disease), cervical (02/19/2023), Diabetes mellitus (CMS/HCC), History of cerebrovascular accident (03/08/2011), History of smoking (12/25/2021), Hyperlipidemia, Hypertension, Lumbar stenosis with neurogenic claudication (09/10/2015), Lymphedema (02/19/2023), Neuropathy (04/27/2016), Osteoarthritis of both knees, unspecified osteoarthritis type  (02/19/2023), Paroxysmal atrial fibrillation (CMS/HCC) (09/07/2004), Peripheral vascular disease (11/14/2018), 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 He has a past surgical history that includes Knee surgery (Left, 10/08/1978); Back surgery (2017); and Cervical spine surgery (2019).    Allergies  Reviewed by Isaias Mater, Silvia, MA on 06/09/2024        Severity Reactions Comments    Glimepiride  High  Went into diabetic shock    Linezolid High Nausea Only, Other (See Comments), Anaphylaxis weakness Decreased appetite, nausea, fatigue. Decreased appetite, nausea, fatigue.    Gabapentin Not Specified Clouded Consciousness              MEDICATIONS:   Patient's current medications were reviewed. ONLY Cardiac medications were updated unless others were addressed in assessment and plan.    Current Outpatient Medications (Relevant to Cardiology)   Medication Sig    metoprolol  succinate XL Take 1 tablet (25 mg) by mouth daily    rosuvastatin  TAKE 1 TABLET(5 MG) BY MOUTH DAILY    apixaban  Take 1 tablet (2.5 mg) by mouth     Current Outpatient Medications (Other)   Medication Sig    tamsulosin Take 1 capsule (0.4 mg) by mouth 2 (two) times daily    CALCIUM  PO Take 1,000 mg by mouth daily    acetaminophen Take 1 tablet (500 mg) by mouth every 6 (six) hours as needed for Pain    Dexcom G7 Receiver Use as directed as per manufacturer    finasteride Take 1 tablet (5 mg) by mouth daily    metFORMIN  TAKE 1 TABLET(1000 MG) BY MOUTH TWICE DAILY    traZODone  Take  3 tablets (150 mg) by mouth once at bedtime    LORazepam  Take 1 tablet by mouth 30-90 minutes prior to procedure    Dexcom G7 continuous blood glucose sensor USE AS DIRECTED AND CHANGE EVERY 10 DAYS       FAMILY HISTORY: family history includes Diabetes in his father and mother; Heart disease in his father; Stroke in his father.    SOCIAL HISTORY: He reports that he quit smoking about 11 years ago. His  smoking use included cigarettes. He started smoking about 61 years ago. He has a 25 pack-year smoking history. He has never used smokeless tobacco. He reports that he does not currently use alcohol. He reports current drug use.    PHYSICAL EXAMINATION    Visit Vitals  BP (!) 163/96 (BP Site: Left arm, Patient Position: Sitting, Cuff Size: Medium)   Pulse 70   Ht 1.778 m (5' 10)   Wt 65.3 kg (144 lb)   SpO2 98%   BMI 20.66 kg/m       Constitutional: Cooperative, alert, no acute distress.  Neck: No carotid bruits, JVP normal.  Cardiac: Regularly irregular, normal S1 and S2; no S3 or S4. No murmurs. No rubs, no gallops.  Pulmonary: Clear to auscultation bilaterally, no wheezing, no rhonchi, no rales.  Extremities: Trace lower extremity edema with braces on.  Vascular: +2 pulses in radial artery bilaterally      LABS REVIEWED:   Lab Results   Component Value Date    WBC 6.8 07/23/2023    HGB 13.2 07/23/2023    HCT 42.5 07/23/2023    PLT 155 07/23/2023     Lab Results   Component Value Date    GLU 109 (H) 04/21/2024    BUN 27 04/21/2024    CREAT 0.95 04/21/2024    NA 135 04/21/2024    K 4.1 04/21/2024    CL 97 04/21/2024    CO2 23 04/21/2024    AST 31 04/23/2023    ALT 38 04/23/2023     Lab Results   Component Value Date    TSH 1.480 11/28/2021    HGBA1C 6.8 (H) 07/23/2023     Lab Results   Component Value Date    CHOL 122 10/16/2022    TRIG 105 10/16/2022    HDL 47 10/16/2022    LDL 56 10/16/2022     No results found for: LPACHOL      Most recent echo and nuclear study reviewed.    Nuclear stress May 2024-small predominantly fixed apical defect consistent with small infarct versus artifact.  Nongated study    Echo Feb 2022 - Mild LVH, EF 60-65%, LAE, indeterminate diastolic function        IMPRESSION:   Brian Pena is a 78 y.o. male with the following problems:     Atrial fibrillation - likely persistent; on lower dosing of Eliquis  due to a history of hematuria  History of profound sinus  bradycardia  Hypertension  Hyperlipidemia - LDL 56 and February 2024  Diabetes - on Glimepiride , A1c 6.16 July 2023  Orthopedic issues  Remote tobacco use  Prior CVA 15+ years ago, residual LUE weakness  Peripheral neuropathy  Limited ability to ambulate due to multiple back surgeries - uses a Rollator  Lower extremity edema      RECOMMENDATIONS:    No changes to medications.  Encouraged to monitor his blood pressure closely at home.  Repeat echocardiogram as has been over 3 years.  APP follow-up in 6  months.  Follow-up with me in 1 year.                                                 Orders Placed This Encounter   Procedures    Echo 2D Complete    Office Visit (HRT [place])    APP Office Visit (HRT [place])       No orders of the defined types were placed in this encounter.      SIGNED:    Davie Slater, MD, [facility], FSCAI      Please pardon any potential grammatical or typographical errors as aspects of this note may have been created through speech-to-text software.

## 2024-06-10 ENCOUNTER — Other Ambulatory Visit: Payer: Self-pay

## 2024-06-15 ENCOUNTER — Ambulatory Visit (INDEPENDENT_AMBULATORY_CARE_PROVIDER_SITE_OTHER): Payer: Self-pay

## 2024-06-16 NOTE — Progress Notes (Signed)
 Read on 06/15/24 at 6:28PM

## 2024-06-20 ENCOUNTER — Ambulatory Visit (INDEPENDENT_AMBULATORY_CARE_PROVIDER_SITE_OTHER): Payer: Self-pay

## 2024-06-28 ENCOUNTER — Other Ambulatory Visit (INDEPENDENT_AMBULATORY_CARE_PROVIDER_SITE_OTHER): Payer: Self-pay

## 2024-06-28 DIAGNOSIS — E782 Mixed hyperlipidemia: Secondary | ICD-10-CM

## 2024-06-28 DIAGNOSIS — E1142 Type 2 diabetes mellitus with diabetic polyneuropathy: Secondary | ICD-10-CM

## 2024-07-03 ENCOUNTER — Telehealth (INDEPENDENT_AMBULATORY_CARE_PROVIDER_SITE_OTHER): Payer: Self-pay | Admitting: Family Medicine

## 2024-07-03 DIAGNOSIS — F40240 Claustrophobia: Secondary | ICD-10-CM

## 2024-07-03 NOTE — Telephone Encounter (Signed)
 Pt's wife calling to request anxiety meds for her husband. She says pt has an MRI tomorrow morning and Megan usually prescribes (2) tablets of anxiety medication for him prior to his MRI's. Pt wife has been notified that Duwaine has left the practice, she requested to have this message forwarded to Dr. Jodeane.       [id number] - [place], [place] - [address] AT [address] [phone]

## 2024-07-04 ENCOUNTER — Telehealth (INDEPENDENT_AMBULATORY_CARE_PROVIDER_SITE_OTHER): Payer: Self-pay

## 2024-07-04 MED ORDER — LORAZEPAM 0.5 MG PO TABS
ORAL_TABLET | ORAL | 0 refills | Status: DC
Start: 2024-07-04 — End: 2024-07-04

## 2024-07-04 MED ORDER — LORAZEPAM 0.5 MG PO TABS: ORAL_TABLET | ORAL | 0 refills | Status: DC

## 2024-07-04 NOTE — Telephone Encounter (Signed)
 Rx 2 pills lorazepam  sent in.

## 2024-07-04 NOTE — Addendum Note (Signed)
 Addended by: Zakaria Sedor I on: 07/04/2024 12:23 PM     Modules accepted: Orders

## 2024-07-04 NOTE — Telephone Encounter (Addendum)
 MRI lumbar w and w/o contrast    [facility] calling to relay Urgent Results.    1. Hemorrhagic-appearing lesion arising from the inferior pole the right kidney. Renal protocol MRI is advised to exclude an underlying mass.   2. 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 3. The worst degree of canal narrowing is at L1-L2 (moderate-to-severe).   4. Remainder as above.     Report should be available in Epic any minute  **please review and advise on behalf of NP [facility]

## 2024-07-04 NOTE — Telephone Encounter (Signed)
 Lmtcb and schedule

## 2024-07-04 NOTE — Telephone Encounter (Signed)
 Patient had MRI this morning with results already sent to provider. Closing task

## 2024-07-04 NOTE — Telephone Encounter (Signed)
 This message got forwarded to me and I also sent a prescription in.  Please let patient know that he only needs to pick up one of the two prescriptions.

## 2024-07-05 ENCOUNTER — Ambulatory Visit (INDEPENDENT_AMBULATORY_CARE_PROVIDER_SITE_OTHER): Payer: Self-pay | Admitting: Family Medicine

## 2024-07-05 ENCOUNTER — Other Ambulatory Visit (INDEPENDENT_AMBULATORY_CARE_PROVIDER_SITE_OTHER): Payer: Self-pay | Admitting: Residents

## 2024-07-05 DIAGNOSIS — E1142 Type 2 diabetes mellitus with diabetic polyneuropathy: Secondary | ICD-10-CM

## 2024-07-05 NOTE — Progress Notes (Signed)
 Your MRI of the lumbar spine showed degenerative changes, particularly at L1-2.  There is also a hemorrhagic appearing lesion at the right kidney incidentally noted.  As we discussed by phone, I would like for you to follow-up with your Neurosurgeon for ongoing management of your degenerative spine disease.  Please also follow-up with Urology as scheduled in about 2 weeks to further assess this right kidney lesion finding.  Please let us  know if you have any questions in the interim.  Take care. -Dr Cyrena, covering for NP [facility]

## 2024-07-07 ENCOUNTER — Telehealth (INDEPENDENT_AMBULATORY_CARE_PROVIDER_SITE_OTHER): Payer: Self-pay

## 2024-07-07 NOTE — Telephone Encounter (Signed)
 Patient's wife, [name] (HIPAA), called stating that she missed a call from us  regarding an appointment, but they were just here.  She wanted to see what the call was in reference to.  I let her know that Gedalya is scheduled for an echocardiogram on 07/14/24 at 10:45 in [place].  She verbalized understanding.  Appointment instructions were relayed.

## 2024-07-11 ENCOUNTER — Telehealth (INDEPENDENT_AMBULATORY_CARE_PROVIDER_SITE_OTHER): Payer: Self-pay | Admitting: Residents

## 2024-07-11 NOTE — Telephone Encounter (Signed)
 [facility] PT faxed on 07/11/24  Confirmation Ok received @3 :59pm  Placed in orange bin

## 2024-07-14 ENCOUNTER — Ambulatory Visit (INDEPENDENT_AMBULATORY_CARE_PROVIDER_SITE_OTHER)

## 2024-07-14 DIAGNOSIS — I4819 Other persistent atrial fibrillation: Secondary | ICD-10-CM

## 2024-07-17 ENCOUNTER — Encounter (INDEPENDENT_AMBULATORY_CARE_PROVIDER_SITE_OTHER): Payer: Self-pay | Admitting: Cardiovascular Disease

## 2024-07-17 LAB — ECHO ADULT TTE COMPLETE
AV Area (Cont Eq VTI): 2.0949
AV Mean Gradient: 5
AV Peak Velocity: 1.47
Ao Root Diameter (2D): 3.6
BP Mod LV Ejection Fraction: 60
IVS Diastolic Thickness (2D): 2.1
LA Dimension (2D): 4.7
LA Volume Index (BP A-L): 73.0541
LVID diastole (2D): 4.1
LVID systole (2D): 2.4
MV E/e' (Average): 13.1549
Mitral Valve Findings: NORMAL
Prox Ascending Aorta Diameter: 3.5
Pulmonary Valve Findings: NORMAL
RV Systolic Pressure: 26.04
TAPSE: 1.9
Tricuspid Valve Findings: NORMAL

## 2024-07-19 ENCOUNTER — Encounter (INDEPENDENT_AMBULATORY_CARE_PROVIDER_SITE_OTHER): Payer: Self-pay | Admitting: Residents

## 2024-07-19 DIAGNOSIS — E1142 Type 2 diabetes mellitus with diabetic polyneuropathy: Secondary | ICD-10-CM

## 2024-07-24 ENCOUNTER — Encounter (INDEPENDENT_AMBULATORY_CARE_PROVIDER_SITE_OTHER): Payer: Self-pay | Admitting: Residents

## 2024-08-17 ENCOUNTER — Other Ambulatory Visit (INDEPENDENT_AMBULATORY_CARE_PROVIDER_SITE_OTHER): Payer: Self-pay | Admitting: Residents

## 2024-08-17 DIAGNOSIS — E782 Mixed hyperlipidemia: Secondary | ICD-10-CM

## 2024-08-17 DIAGNOSIS — F5101 Primary insomnia: Secondary | ICD-10-CM

## 2024-08-17 DIAGNOSIS — E1142 Type 2 diabetes mellitus with diabetic polyneuropathy: Secondary | ICD-10-CM

## 2024-08-22 NOTE — Telephone Encounter (Signed)
 Called patient to ask about this medication since we didn't prescribe it. Was informed that it is prescribed by the [place] and he is not in need of this, and is not sure why it came to us .     Please deny, we do not manage this

## 2024-08-23 NOTE — Telephone Encounter (Signed)
 NP [facility] patient, please schedule a follow up to establish care with another provider

## 2024-08-24 ENCOUNTER — Encounter (INDEPENDENT_AMBULATORY_CARE_PROVIDER_SITE_OTHER): Payer: Self-pay

## 2024-08-24 NOTE — Telephone Encounter (Signed)
No VM, sent mychart message

## 2024-09-04 ENCOUNTER — Other Ambulatory Visit (INDEPENDENT_AMBULATORY_CARE_PROVIDER_SITE_OTHER): Payer: Self-pay

## 2024-09-04 DIAGNOSIS — E1142 Type 2 diabetes mellitus with diabetic polyneuropathy: Secondary | ICD-10-CM

## 2024-09-04 NOTE — Telephone Encounter (Signed)
 Pt's wife advised that pt needs an appt prior to next refill request

## 2024-09-04 NOTE — Telephone Encounter (Signed)
 Second courtesy refill. Please call patient and set up chronic care follow up, overdue and needs to establish care with another provider

## 2024-10-01 ENCOUNTER — Other Ambulatory Visit (INDEPENDENT_AMBULATORY_CARE_PROVIDER_SITE_OTHER): Payer: Self-pay

## 2024-10-01 DIAGNOSIS — F5101 Primary insomnia: Secondary | ICD-10-CM

## 2024-10-02 NOTE — Telephone Encounter (Signed)
 Pt.notified

## 2024-10-02 NOTE — Telephone Encounter (Addendum)
 Requested Prescriptions     Pending Prescriptions Disp Refills    traZODone  (DESYREL ) 50 MG tablet [Pharmacy Med Name: TRAZODONE  50MG  TABLETS] 90 tablet 0     Sig: TAKE 3 TABLETS(150 MG) BY MOUTH 1 TIME AT BEDTIME       Due for 6 month chronic care follow-up, please schedule appt- overdue- last seen regarding this medication review 2024 with NP Jon

## 2024-10-12 ENCOUNTER — Ambulatory Visit (INDEPENDENT_AMBULATORY_CARE_PROVIDER_SITE_OTHER)

## 2024-10-12 ENCOUNTER — Encounter (INDEPENDENT_AMBULATORY_CARE_PROVIDER_SITE_OTHER): Payer: Self-pay

## 2024-10-12 VITALS — BP 132/70 | HR 64 | Temp 97.8°F

## 2024-10-12 DIAGNOSIS — L723 Sebaceous cyst: Secondary | ICD-10-CM

## 2024-10-12 DIAGNOSIS — I480 Paroxysmal atrial fibrillation: Secondary | ICD-10-CM

## 2024-10-12 DIAGNOSIS — R350 Frequency of micturition: Secondary | ICD-10-CM

## 2024-10-12 DIAGNOSIS — Z23 Encounter for immunization: Secondary | ICD-10-CM

## 2024-10-12 DIAGNOSIS — Z87440 Personal history of urinary (tract) infections: Secondary | ICD-10-CM

## 2024-10-12 DIAGNOSIS — F5101 Primary insomnia: Secondary | ICD-10-CM

## 2024-10-12 DIAGNOSIS — R82998 Other abnormal findings in urine: Secondary | ICD-10-CM

## 2024-10-12 DIAGNOSIS — E1142 Type 2 diabetes mellitus with diabetic polyneuropathy: Secondary | ICD-10-CM

## 2024-10-12 MED ORDER — TRAZODONE HCL 50 MG PO TABS: 150.0000 mg | ORAL_TABLET | Freq: Every evening | ORAL | 0 refills | Status: AC

## 2024-10-12 MED ORDER — TAMSULOSIN HCL 0.4 MG PO CAPS: 0.4000 mg | ORAL_CAPSULE | Freq: Two times a day (BID) | ORAL | 1 refills | Status: AC

## 2024-10-12 NOTE — Progress Notes (Signed)
 [facility]  A Founding Member of [facility]                     Date of Exam: 10/12/2024 1:00 PM        Patient ID: Brian Pena is a 79 y.o. male.  Attending Physician: Rosaline LELON Oms, FNP          Chief Complaint:    Chief Complaint   Patient presents with    Chronic Care Follow Up               HPI:    79 year old male Brian Pena presents today for chronic care follow up:    Insomnia  Currently taking trazadone 50 mg tablets nightly  Reports has been working well, no concerns at this time  Averages 4 hours of sleep nightly   Requesting refills    Frequent urination  Tamsulosin  0.4 mg tablets bid  Sees urologist every 6 months, difficult to schedule an appointment and is requesting refill  Reports waking up anywhere from 2-3 times in the night to urinate    Type II DM  Currently taking metformin  1000 mg tablets bid   Reports well-controlled  Pt states is not consuming a well-balanced diet currently  Up to date on ophthalmology exams    Paroxysmal atrial fibrillation  Currently taking apixaban  2.5 mg tablets daily, managed by cardiology    chronic UTI's  Reports film like covering of the urine, dark amber colored as of one month ago  Endorses urgency  Denies hematuria or dysuria   Pt drinks 32 oz of water  daily  Wife is concerned it may be a UTI, would like to test urine.    Sebaceous cyst of the chest  Pt reports cyst of the chest that has been intermittently recurring over the past couple of years  Stated has gotten drained a couple of times  Denies pain, tenderness  States it is not bothersome therefore he has not gone to surgeon to remove    Overdue for influenza vaccines              Problem List:    Problem List[1]          Current Meds:    Medications Taking[2]       Allergies:    Allergies[3]          Past Surgical History:    Past Surgical History[4]        Family History:    Family History[5]        Social History:    Social History[6]        The following sections were  reviewed this encounter by the provider:   Allergies             Vital Signs:    BP 132/70 (BP Site: Left arm, Patient Position: Sitting, Cuff Size: Medium)   Pulse 64   Temp 97.8 F (36.6 C) (Tympanic)   SpO2 98%          ROS:    Review of Systems   Constitutional: Negative.    HENT: Negative.     Respiratory: Negative.     Cardiovascular: Negative.    Gastrointestinal: Negative.    Genitourinary:  Positive for urgency. Negative for dysuria and hematuria.   Musculoskeletal: Negative.    Neurological: Negative.    Psychiatric/Behavioral:  Positive for sleep disturbance.  Physical Exam:    Physical Exam  Vitals and nursing note reviewed.   Constitutional:       Appearance: Normal appearance.   HENT:      Head: Normocephalic and atraumatic.      Nose: Nose normal.      Mouth/Throat:      Mouth: Mucous membranes are moist.      Pharynx: Oropharynx is clear.   Eyes:      Extraocular Movements: Extraocular movements intact.   Cardiovascular:      Rate and Rhythm: Normal rate and regular rhythm.      Pulses: Normal pulses.      Heart sounds: Normal heart sounds.   Pulmonary:      Effort: Pulmonary effort is normal.      Breath sounds: Normal breath sounds.   Abdominal:      General: Abdomen is flat. Bowel sounds are normal.      Palpations: Abdomen is soft.      Tenderness: There is no abdominal tenderness.   Musculoskeletal:      Comments: Pt in wheelchair   Skin:     General: Skin is warm and dry.           Comments: Round pus-filled sebaceous cyst, 5x5 cm   Neurological:      General: No focal deficit present.      Mental Status: He is alert and oriented to person, place, and time.   Psychiatric:         Mood and Affect: Mood normal.         Behavior: Behavior normal.              Assessment:    1. Primary insomnia  - traZODone  (DESYREL ) 50 MG tablet; Take 3 tablets (150 mg) by mouth once at bedtime  Dispense: 90 tablet; Refill: 0    2. Frequent urination  - tamsulosin  (FLOMAX ) 0.4 MG Cap; Take 1  capsule (0.4 mg) by mouth 2 (two) times daily  Dispense: 180 capsule; Refill: 1    3. Type 2 diabetes mellitus with diabetic polyneuropathy, without long-term current use of insulin (CMS/HCC)  - Hemoglobin A1C; Future  - Urine Microalbumin, Random; Future  - Comprehensive Metabolic Panel; Future  - Hemoglobin A1C  - Urine Microalbumin, Random  - Comprehensive Metabolic Panel    4. History of UTI  - Urinalysis with Reflex to Microscopic Exam and Culture; Future  - Urinalysis with Reflex to Microscopic Exam and Culture    5. Paroxysmal atrial fibrillation (CMS/HCC)  - CBC with Differential (Order); Future  - CBC with Differential (Order)    6. Amber-colored urine  - Urinalysis with Reflex to Microscopic Exam and Culture; Future  - Urinalysis with Reflex to Microscopic Exam and Culture    7. Sebaceous cyst            Plan:    Insomnia  Refilled trazadone 50 mg tablets and advised to take as prescribed  Educated regarding sleep hygiene measures    Frequent urination  Tamsulosin  0.4mg  capsules refilled and advised to take as prescribed    Type II DM  Continue metformin  1000 mg tablets bid  Educated regarding healthy lifestyle including well-balanced diet and physical activity  Will reach out regarding results of blood work    Paroxysmal atrial fibrillation  Continue apixaban  2.5 mg tablets daily as prescribed  Follow up with cardiology as scheduled    Chronic UTI's  Urine analysis and reflex to culture ordered, will update  with results  If bacteria noted in culture, will contact to send in appropriate antibiotic to the pharmacy    Sebaceous cyst of the chest  Monitor for any red flags symptoms including pain, growth in size  Can refer to surgery to remove if becomes bothersome    High dose flu given   Declined shingles vaccine            Follow-up:    Return in about 6 months (around 04/11/2025).         Rosaline LELON Oms, FNP              [1]   Patient Active Problem List  Diagnosis    Type 2 diabetes mellitus with  diabetic polyneuropathy, without long-term current use of insulin (CMS/HCC)    Paroxysmal atrial fibrillation (CMS/HCC)    Essential hypertension    Mixed hyperlipidemia    Cellulitis of right lower limb   [2]   Outpatient Medications Marked as Taking for the 10/12/24 encounter (Office Visit) with Oms Rosaline LELON, FNP   Medication Sig Dispense Refill    acetaminophen (TYLENOL) 500 MG tablet Take 1 tablet (500 mg) by mouth every 6 (six) hours as needed for Pain      apixaban  (Eliquis ) 2.5 MG tablet Take 1 tablet (2.5 mg) by mouth      CALCIUM  PO Take 1,000 mg by mouth daily      Continuous Glucose Receiver (Dexcom G7 Receiver) Device Use as directed as per manufacturer 1 each 0    Dexcom G7 continuous blood glucose sensor (DEXCOM G7) USE AS DIRECTED AND CHANGE EVERY 10 DAYS 9 each 3    metFORMIN  (GLUCOPHAGE ) 1000 MG tablet TAKE 1 TABLET(1000 MG) BY MOUTH EVERY MORNING WITH BREAKFAST 90 tablet 0    metoprolol  succinate XL (TOPROL -XL) 25 MG 24 hr tablet Take 1 tablet (25 mg) by mouth daily 90 tablet 3    rosuvastatin  (CRESTOR ) 5 MG tablet TAKE 1 TABLET(5 MG) BY MOUTH DAILY 30 tablet 0    [DISCONTINUED] tamsulosin  (FLOMAX ) 0.4 MG Cap Take 1 capsule (0.4 mg) by mouth 2 (two) times daily      [DISCONTINUED] traZODone  (DESYREL ) 50 MG tablet TAKE 3 TABLETS(150 MG) BY MOUTH EVERY NIGHT 90 tablet 0   [3]   Allergies  Allergen Reactions    Glimepiride       Went into diabetic shock    Linezolid Nausea Only, Other (See Comments) and Anaphylaxis     weakness  Decreased appetite, nausea, fatigue.  Decreased appetite, nausea, fatigue.      Gabapentin Clouded Consciousness   [4]   Past Surgical History:  Procedure Laterality Date    BACK SURGERY  2017    scoliosis and stenosis - 3 surgeries total    CERVICAL SPINE SURGERY  2019    KNEE SURGERY Left 10/08/1978    medial meniscectomy   [5]   Family History  Problem Relation Name Age of Onset    Diabetes Mother      Heart disease Father      Stroke Father      Diabetes Father     [6]    Social History  Tobacco Use    Smoking status: Former     Current packs/day: 0.00     Average packs/day: 0.5 packs/day for 50.0 years (25.0 ttl pk-yrs)     Types: Cigarettes     Start date: 26     Quit date: 2014     Years since quitting: 12.1  Smokeless tobacco: Never   Vaping Use    Vaping status: Never Used   Substance Use Topics    Alcohol use: Not Currently     Alcohol/week: 0.0 standard drinks of alcohol    Drug use: Yes     Comment: cannabis gummies for pain

## 2024-12-08 ENCOUNTER — Encounter (INDEPENDENT_AMBULATORY_CARE_PROVIDER_SITE_OTHER): Admitting: Family Nurse Practitioner
